# Patient Record
Sex: Female | Born: 1935 | Race: White | Hispanic: No | Smoking: Never smoker
Health system: Southern US, Community
[De-identification: ages and names within clinical notes are randomized; demographics above are authoritative.]

## PROBLEM LIST (undated history)

## (undated) DIAGNOSIS — F32A Depression, unspecified: Secondary | ICD-10-CM

## (undated) DIAGNOSIS — F419 Anxiety disorder, unspecified: Secondary | ICD-10-CM

## (undated) DIAGNOSIS — F329 Major depressive disorder, single episode, unspecified: Secondary | ICD-10-CM

## (undated) DIAGNOSIS — R42 Dizziness and giddiness: Secondary | ICD-10-CM

## (undated) DIAGNOSIS — I10 Essential (primary) hypertension: Secondary | ICD-10-CM

## (undated) DIAGNOSIS — E785 Hyperlipidemia, unspecified: Secondary | ICD-10-CM

## (undated) DIAGNOSIS — K219 Gastro-esophageal reflux disease without esophagitis: Secondary | ICD-10-CM

## (undated) DIAGNOSIS — I639 Cerebral infarction, unspecified: Secondary | ICD-10-CM

## (undated) DIAGNOSIS — Z87440 Personal history of urinary (tract) infections: Secondary | ICD-10-CM

## (undated) DIAGNOSIS — K922 Gastrointestinal hemorrhage, unspecified: Secondary | ICD-10-CM

## (undated) DIAGNOSIS — F0390 Unspecified dementia without behavioral disturbance: Secondary | ICD-10-CM

## (undated) HISTORY — DX: Anxiety disorder, unspecified: F41.9

## (undated) HISTORY — PX: ELBOW SURGERY: SHX618

## (undated) HISTORY — DX: Cerebral infarction, unspecified: I63.9

## (undated) HISTORY — PX: CATARACT EXTRACTION: SUR2

## (undated) HISTORY — DX: Dizziness and giddiness: R42

## (undated) HISTORY — DX: Personal history of urinary (tract) infections: Z87.440

## (undated) HISTORY — PX: CHOLECYSTECTOMY: SHX55

---

## 1993-01-14 HISTORY — PX: CARPAL TUNNEL RELEASE: SHX101

## 2000-01-15 HISTORY — PX: ORIF DISTAL RADIUS FRACTURE: SUR927

## 2004-02-03 ENCOUNTER — Ambulatory Visit: Payer: Self-pay | Admitting: Unknown Physician Specialty

## 2004-12-26 ENCOUNTER — Ambulatory Visit: Payer: Self-pay | Admitting: Unknown Physician Specialty

## 2005-06-13 ENCOUNTER — Inpatient Hospital Stay: Payer: Self-pay | Admitting: Internal Medicine

## 2005-06-13 ENCOUNTER — Other Ambulatory Visit: Payer: Self-pay

## 2005-06-20 ENCOUNTER — Ambulatory Visit: Payer: Self-pay | Admitting: Vascular Surgery

## 2006-01-14 HISTORY — PX: ERCP W/ SPHINCTEROTOMY AND BALLOON DILATION: SHX1524

## 2006-02-07 ENCOUNTER — Ambulatory Visit: Payer: Self-pay | Admitting: Unknown Physician Specialty

## 2006-03-14 ENCOUNTER — Inpatient Hospital Stay: Payer: Self-pay | Admitting: Internal Medicine

## 2007-03-11 ENCOUNTER — Ambulatory Visit: Payer: Self-pay | Admitting: Unknown Physician Specialty

## 2007-11-09 ENCOUNTER — Ambulatory Visit: Payer: Self-pay | Admitting: General Practice

## 2007-11-23 ENCOUNTER — Inpatient Hospital Stay: Payer: Self-pay | Admitting: General Practice

## 2007-11-23 HISTORY — PX: TOTAL KNEE ARTHROPLASTY: SHX125

## 2008-01-26 ENCOUNTER — Ambulatory Visit: Payer: Self-pay | Admitting: General Practice

## 2008-06-10 ENCOUNTER — Ambulatory Visit: Payer: Self-pay | Admitting: Unknown Physician Specialty

## 2009-06-30 ENCOUNTER — Ambulatory Visit: Payer: Self-pay | Admitting: Internal Medicine

## 2009-07-07 ENCOUNTER — Ambulatory Visit: Payer: Self-pay | Admitting: Internal Medicine

## 2009-07-14 ENCOUNTER — Ambulatory Visit: Payer: Self-pay | Admitting: Internal Medicine

## 2009-07-21 ENCOUNTER — Ambulatory Visit: Payer: Self-pay | Admitting: Internal Medicine

## 2009-08-04 ENCOUNTER — Ambulatory Visit: Payer: Self-pay | Admitting: Internal Medicine

## 2009-08-18 ENCOUNTER — Ambulatory Visit: Payer: Self-pay | Admitting: Internal Medicine

## 2009-08-31 ENCOUNTER — Ambulatory Visit: Payer: Self-pay | Admitting: Internal Medicine

## 2009-09-14 ENCOUNTER — Ambulatory Visit: Payer: Self-pay | Admitting: Internal Medicine

## 2009-10-17 ENCOUNTER — Ambulatory Visit: Payer: Self-pay | Admitting: Unknown Physician Specialty

## 2009-11-16 ENCOUNTER — Encounter: Payer: Self-pay | Admitting: Unknown Physician Specialty

## 2009-11-24 ENCOUNTER — Ambulatory Visit: Payer: Self-pay | Admitting: Unknown Physician Specialty

## 2009-12-14 ENCOUNTER — Encounter: Payer: Self-pay | Admitting: Unknown Physician Specialty

## 2009-12-20 ENCOUNTER — Ambulatory Visit: Payer: Self-pay | Admitting: Neurology

## 2010-01-14 ENCOUNTER — Encounter: Payer: Self-pay | Admitting: Unknown Physician Specialty

## 2010-06-27 ENCOUNTER — Ambulatory Visit: Payer: Self-pay | Admitting: Unknown Physician Specialty

## 2010-07-10 ENCOUNTER — Inpatient Hospital Stay: Payer: Self-pay | Admitting: Internal Medicine

## 2011-11-09 ENCOUNTER — Emergency Department: Payer: Self-pay | Admitting: Emergency Medicine

## 2012-10-15 ENCOUNTER — Ambulatory Visit: Payer: Self-pay | Admitting: Orthopedic Surgery

## 2012-10-15 LAB — POTASSIUM: Potassium: 4.3 mmol/L (ref 3.5–5.1)

## 2012-10-15 LAB — HEMOGLOBIN: HGB: 13.7 g/dL (ref 12.0–16.0)

## 2012-11-19 ENCOUNTER — Emergency Department: Payer: Self-pay | Admitting: Emergency Medicine

## 2012-11-19 LAB — URINALYSIS, COMPLETE
Bilirubin,UR: NEGATIVE
Ketone: NEGATIVE
Nitrite: NEGATIVE
Ph: 5 (ref 4.5–8.0)
RBC,UR: 1 /HPF (ref 0–5)
Specific Gravity: 1.034 (ref 1.003–1.030)
Squamous Epithelial: 1
WBC UR: 3 /HPF (ref 0–5)

## 2012-11-19 LAB — COMPREHENSIVE METABOLIC PANEL
Albumin: 3.9 g/dL (ref 3.4–5.0)
Alkaline Phosphatase: 82 U/L (ref 50–136)
Anion Gap: 4 — ABNORMAL LOW (ref 7–16)
BUN: 22 mg/dL — ABNORMAL HIGH (ref 7–18)
Bilirubin,Total: 0.7 mg/dL (ref 0.2–1.0)
Calcium, Total: 9.2 mg/dL (ref 8.5–10.1)
Chloride: 103 mmol/L (ref 98–107)
EGFR (African American): >60
EGFR (Non-African Amer.): >60
Osmolality: 275 (ref 275–301)
Potassium: 3.7 mmol/L (ref 3.5–5.1)
SGPT (ALT): 24 U/L (ref 12–78)
Sodium: 135 mmol/L — ABNORMAL LOW (ref 136–145)
Total Protein: 7 g/dL (ref 6.4–8.2)

## 2012-11-19 LAB — CBC
MCH: 30.2 pg (ref 26.0–34.0)
MCHC: 34.3 g/dL (ref 32.0–36.0)
MCV: 88 fL (ref 80–100)
Platelet: 219 10*3/uL (ref 150–440)
RBC: 4.5 10*6/uL (ref 3.80–5.20)
WBC: 23 10*3/uL — ABNORMAL HIGH (ref 3.6–11.0)

## 2012-11-19 LAB — CK TOTAL AND CKMB (NOT AT ARMC): CK-MB: 10.3 ng/mL — ABNORMAL HIGH (ref 0.5–3.6)

## 2012-12-04 ENCOUNTER — Other Ambulatory Visit: Payer: Self-pay

## 2013-04-28 ENCOUNTER — Ambulatory Visit: Payer: Self-pay | Admitting: Orthopedic Surgery

## 2013-04-28 LAB — CBC WITH DIFFERENTIAL/PLATELET
BASOS PCT: 0.5 %
Basophil #: 0.1 10*3/uL (ref 0.0–0.1)
EOS ABS: 0.2 10*3/uL (ref 0.0–0.7)
EOS PCT: 1.1 %
HCT: 41.4 % (ref 35.0–47.0)
HGB: 13.6 g/dL (ref 12.0–16.0)
Lymphocyte #: 1.7 10*3/uL (ref 1.0–3.6)
Lymphocyte %: 11.3 %
MCH: 30.1 pg (ref 26.0–34.0)
MCHC: 32.9 g/dL (ref 32.0–36.0)
MCV: 92 fL (ref 80–100)
Monocyte #: 0.8 x10 3/mm (ref 0.2–0.9)
Monocyte %: 5.5 %
NEUTROS PCT: 81.6 %
Neutrophil #: 12 10*3/uL — ABNORMAL HIGH (ref 1.4–6.5)
PLATELETS: 187 10*3/uL (ref 150–440)
RBC: 4.51 10*6/uL (ref 3.80–5.20)
RDW: 13.4 % (ref 11.5–14.5)
WBC: 14.7 10*3/uL — ABNORMAL HIGH (ref 3.6–11.0)

## 2013-04-29 ENCOUNTER — Ambulatory Visit: Payer: Self-pay | Admitting: Orthopedic Surgery

## 2013-04-30 HISTORY — PX: CARPAL TUNNEL RELEASE: SHX101

## 2013-04-30 HISTORY — PX: TRIGGER FINGER RELEASE: SHX641

## 2013-06-11 ENCOUNTER — Ambulatory Visit: Payer: Self-pay | Admitting: Neurology

## 2013-08-02 ENCOUNTER — Ambulatory Visit: Payer: Self-pay | Admitting: Ophthalmology

## 2013-08-02 DIAGNOSIS — I10 Essential (primary) hypertension: Secondary | ICD-10-CM

## 2013-08-02 LAB — POTASSIUM: Potassium: 4.4 mmol/L (ref 3.5–5.1)

## 2013-08-12 ENCOUNTER — Ambulatory Visit: Payer: Self-pay | Admitting: Ophthalmology

## 2013-09-13 ENCOUNTER — Ambulatory Visit: Payer: Self-pay | Admitting: Ophthalmology

## 2013-09-13 LAB — POTASSIUM: Potassium: 4.6 mmol/L (ref 3.5–5.1)

## 2013-09-29 ENCOUNTER — Ambulatory Visit: Payer: Self-pay | Admitting: General Practice

## 2013-10-27 ENCOUNTER — Ambulatory Visit: Payer: Self-pay | Admitting: Ophthalmology

## 2014-05-07 NOTE — Op Note (Signed)
PATIENT NAME:  Ledon SnareHUGHES, Rita Vang  DATE OF PROCEDURE:  10/27/2013  PREOPERATIVE DIAGNOSIS:  Nuclear sclerotic cataract left eye.  ICD-10 H25.12  POSTOPERATIVE DIAGNOSIS:  Nuclear sclerotic cataract left eye.  PROCEDURE:  Phacoemulsification with posterior chamber intraocular lens implantation of the left eye.   LENS:  ZCB00 22.0-diopter posterior chamber intraocular lens.  ULTRASOUND TIME:  11% of 1 minute, 34 seconds.  CDE 10.7.    SURGEON:  Italyhad Torianne Laflam, MD  ANESTHESIA:  Topical with tetracaine drops and 2% Xylocaine jelly.  COMPLICATIONS:  None.  DESCRIPTION OF PROCEDURE:  The patient was identified in the holding room and transported to the operating room and placed in the supine position under the operating microscope.  The left eye was identified as the operative eye and it was prepped and draped in the usual sterile ophthalmic fashion.  A 1 millimeter clear-corneal paracentesis was made at the 1:30 position.  The anterior chamber was filled with Viscoat viscoelastic.  A 2.4 millimeter keratome was used to make a near-clear corneal incision at the 10:30 position.  A curvilinear capsulorrhexis was made with a cystotome and capsulorrhexis forceps.  Balanced salt solution was used to hydrodissect and hydrodelineate the nucleus.  Phacoemulsification was then used in stop and chop fashion to remove the lens nucleus and epinucleus.  The remaining cortex was then removed using the irrigation and aspiration handpiece. Provisc was then placed into the capsular bag to distend it for lens placement.  A ZCB00 22.0 diopter lens was then injected into the capsular bag.  The remaining viscoelastic was aspirated.  Wounds were hydrated with balanced salt solution.  The anterior chamber was inflated to a physiologic pressure with balanced salt solution.  0.1 mL of cefuroxime 10 mg/mL were injected into the anterior chamber for a dose of 1 mg of intracameral  antibiotic at the completion of the case. Miostat was placed into the anterior chamber to constrict the pupil.  No wound leaks were noted.  Topical Vigamox drops and Maxitrol ointment were applied to the eye.  The patient was taken to the recovery room in stable condition without complications of anesthesia or surgery.   ____________________________ Deirdre Evenerhadwick R. Keidra Withers, MD crb:bu D: 10/27/2013 16:28:42 ET T: 10/27/2013 21:00:43 ET JOB#: [id number]  cc: Deirdre Evenerhadwick R. Jerrald Doverspike, MD, <Dictator>    Lockie MolaHADWICK Karan Inclan MD ELECTRONICALLY SIGNED 10/28/2013 8:07

## 2014-05-07 NOTE — Op Note (Signed)
PATIENT NAME:  Rita Vang, Rita Vang MR#:  [id number] DATE OF BIRTH:  25-Mar-1924  DATE OF PROCEDURE:  08/12/2013  PREOPERATIVE DIAGNOSIS:  Senile cataract right eye.  POSTOPERATIVE DIAGNOSIS:  Senile cataract right eye.  PROCEDURE:  Phacoemulsification with posterior chamber intraocular lens implantation of the right eye.  LENS:  ZCBOO 22.0-diopter posterior chamber intraocular lens.  ULTRASOUND TIME:  17% of 1 minute, 15 seconds.  CDE 12.8.  SURGEON:  Italyhad Orvile Corona, MD  ANESTHESIA:  Topical with tetracaine drops and 2% Xylocaine jelly.  COMPLICATIONS:  None.  DESCRIPTION OF PROCEDURE:  The patient was identified in the holding room and transported to the operating room and placed in the supine position under the operating microscope.  The right eye was identified as the operative eye and it was prepped and draped in the usual sterile ophthalmic fashion.  A 1 millimeter clear-corneal paracentesis was made at the 12:00 position.  The anterior chamber was filled with Viscoat viscoelastic.  A 2.4 millimeter keratome was used to make a near-clear corneal incision at the 9:00 position.  A curvilinear capsulorrhexis was made with a cystotome and capsulorrhexis forceps.  Balanced salt solution was used to hydrodissect and hydrodelineate the nucleus.  Phacoemulsification was then used in stop and chop fashion to remove the lens nucleus and epinucleus.  The remaining cortex was then removed using the irrigation and aspiration handpiece. Provisc was then placed into the capsular bag to distend it for lens placement.  A ZCBOO 22.0-diopter lens was then injected into the capsular bag.  The remaining viscoelastic was aspirated.  Wounds were hydrated with balanced salt solution.  The anterior chamber was inflated to a physiologic pressure with balanced salt solution.  0.1 mL of cefuroxime 10 mg/mL were injected into the anterior chamber for a dose of 1 mg of intracameral antibiotic at the completion of the  case. Miostat was placed into the anterior chamber to constrict the pupil.  No wound leaks were noted.  Topical Vigamox drops and Maxitrol ointment were applied to the eye.  The patient was taken to the recovery room in stable condition without complications of anesthesia or surgery.   ____________________________ Deirdre Evenerhadwick R. Yarithza Mink, MD crb:ts D: 08/12/2013 11:36:38 ET T: 08/12/2013 11:59:10 ET JOB#: [id number]  cc: Deirdre Evenerhadwick R. Liany Mumpower, MD, <Dictator> Lockie MolaHADWICK Julie-Anne Torain MD ELECTRONICALLY SIGNED 08/12/2013 13:08

## 2014-05-07 NOTE — Op Note (Signed)
PATIENT NAME:  Rita SnareHUGHES, Agueda L MR#:  [id number] DATE OF BIRTH:  [birth year]  DATE OF PROCEDURE:  04/29/2013  PREOPERATIVE DIAGNOSIS:  Left carpal tunnel syndrome and left middle trigger finger.   POSTOPERATIVE DIAGNOSIS:  Left carpal tunnel syndrome and left middle trigger finger.  PROCEDURE:  Left carpal tunnel release, left long trigger finger release.   ANESTHESIA:  MAC with a local.   SURGEON:  Kennedy BuckerMichael Jamera Vanloan, M.D.   DESCRIPTION OF PROCEDURE:  The patient was brought to the Operating Room and after adequate sedation was given, 10 mL was infiltrated in the area of the carpal tunnel incision, 5 mL in the area of the A1 pulley of the long finger with a 50-50 mix used in both of 1% Xylocaine and 0.5% Sensorcaine without epinephrine.  After prepping and draping in the usual sterile fashion, appropriate patient identification, timeout procedures were completed and a tourniquet below the elbow was raised to 250 mmHg.  An incision for the carpal tunnel was made in line with the ring metacarpal.  Skin and subcutaneous tissue incised and spread.  The small Weitlaner retractor is placed and the transverse carpal ligament opened.  A vascular hemostat was placed underneath this to protect the underlying structure and release was carried out distally and then proximally until there was good vascular blush visible within the nerve.  Following this, the wound was irrigated and subsequently closed with 4-0 nylon.  Next, the trigger finger was released with a small approximately 1.5 cm incision over the A1 pulley.  Subcutaneous tissue was spread and the A1 pulley was exposed and quite thick and dense.  After release, the tendon was noted to be intact, but quite swollen distal to this and with passive range of motion there is no triggering.  The wound was irrigated and closed with simple interrupted 4-0 nylon.  Sterile dressings of Xeroform, 4 x 4's, Webril and Ace wrap were applied and the patient was sent to the  recovery room in stable condition.   ESTIMATED BLOOD LOSS:  Minimal.   COMPLICATIONS:  None.   SPECIMEN:  None.   TOURNIQUET TIME:  18 minutes at 250 mmHg.     ____________________________ Leitha SchullerMichael J. Adalaya Irion, MD mjm:ea D: 04/29/2013 16:20:36 ET T: 04/30/2013 00:49:22 ET JOB#: [id number]  cc: Leitha SchullerMichael J. Keirra Zeimet, MD, <Dictator> Leitha SchullerMICHAEL J Makhia Vosler MD ELECTRONICALLY SIGNED 04/30/2013 1:21

## 2014-08-23 ENCOUNTER — Emergency Department: Payer: Medicare Other

## 2014-08-23 ENCOUNTER — Inpatient Hospital Stay: Payer: Medicare Other

## 2014-08-23 ENCOUNTER — Encounter: Payer: Self-pay | Admitting: Emergency Medicine

## 2014-08-23 ENCOUNTER — Inpatient Hospital Stay (HOSPITAL_COMMUNITY)
Admit: 2014-08-23 | Discharge: 2014-08-23 | Disposition: A | Discharge status: Home / Self Care | Payer: Medicare Other | Attending: Internal Medicine | Admitting: Internal Medicine

## 2014-08-23 ENCOUNTER — Inpatient Hospital Stay
Admission: EM | Admit: 2014-08-23 | Discharge: 2014-08-24 | DRG: 066 | Disposition: A | Discharge status: Home / Self Care | Payer: Medicare Other | Attending: Internal Medicine | Admitting: Internal Medicine

## 2014-08-23 DIAGNOSIS — I340 Nonrheumatic mitral (valve) insufficiency: Secondary | ICD-10-CM

## 2014-08-23 DIAGNOSIS — F0390 Unspecified dementia without behavioral disturbance: Secondary | ICD-10-CM | POA: Diagnosis present

## 2014-08-23 DIAGNOSIS — F329 Major depressive disorder, single episode, unspecified: Secondary | ICD-10-CM | POA: Diagnosis present

## 2014-08-23 DIAGNOSIS — R471 Dysarthria and anarthria: Secondary | ICD-10-CM | POA: Diagnosis present

## 2014-08-23 DIAGNOSIS — Z8673 Personal history of transient ischemic attack (TIA), and cerebral infarction without residual deficits: Secondary | ICD-10-CM

## 2014-08-23 DIAGNOSIS — I10 Essential (primary) hypertension: Secondary | ICD-10-CM | POA: Diagnosis present

## 2014-08-23 DIAGNOSIS — I63412 Cerebral infarction due to embolism of left middle cerebral artery: Secondary | ICD-10-CM | POA: Diagnosis not present

## 2014-08-23 DIAGNOSIS — I639 Cerebral infarction, unspecified: Principal | ICD-10-CM | POA: Diagnosis present

## 2014-08-23 HISTORY — DX: Unspecified dementia, unspecified severity, without behavioral disturbance, psychotic disturbance, mood disturbance, and anxiety: F03.90

## 2014-08-23 HISTORY — DX: Essential (primary) hypertension: I10

## 2014-08-23 HISTORY — DX: Major depressive disorder, single episode, unspecified: F32.9

## 2014-08-23 HISTORY — DX: Depression, unspecified: F32.A

## 2014-08-23 HISTORY — DX: Cerebral infarction, unspecified: I63.9

## 2014-08-23 LAB — COMPREHENSIVE METABOLIC PANEL
ALK PHOS: 86 U/L (ref 38–126)
ALT: 21 U/L (ref 14–54)
AST: 28 U/L (ref 15–41)
Albumin: 4.1 g/dL (ref 3.5–5.0)
Anion gap: 11 (ref 5–15)
BILIRUBIN TOTAL: 0.5 mg/dL (ref 0.3–1.2)
BUN: 26 mg/dL — ABNORMAL HIGH (ref 6–20)
CO2: 24 mmol/L (ref 22–32)
Calcium: 8.9 mg/dL (ref 8.9–10.3)
Chloride: 98 mmol/L — ABNORMAL LOW (ref 101–111)
Creatinine, Ser: 1.1 mg/dL — ABNORMAL HIGH (ref 0.44–1.00)
GFR calc Af Amer: 51 mL/min — ABNORMAL LOW (ref >60)
GFR calc non Af Amer: 44 mL/min — ABNORMAL LOW (ref >60)
Glucose, Bld: 111 mg/dL — ABNORMAL HIGH (ref 65–99)
POTASSIUM: 4.3 mmol/L (ref 3.5–5.1)
SODIUM: 133 mmol/L — AB (ref 135–145)
Total Protein: 6.4 g/dL — ABNORMAL LOW (ref 6.5–8.1)

## 2014-08-23 LAB — URINALYSIS COMPLETE WITH MICROSCOPIC (ARMC ONLY)
Bacteria, UA: NONE SEEN
Bilirubin Urine: NEGATIVE
Glucose, UA: NEGATIVE mg/dL
HGB URINE DIPSTICK: NEGATIVE
KETONES UR: NEGATIVE mg/dL
Leukocytes, UA: NEGATIVE
Nitrite: NEGATIVE
PH: 5 (ref 5.0–8.0)
Protein, ur: NEGATIVE mg/dL
Specific Gravity, Urine: 1.013 (ref 1.005–1.030)
Squamous Epithelial / LPF: NONE SEEN

## 2014-08-23 LAB — DIFFERENTIAL
Basophils Absolute: 0.1 10*3/uL (ref 0–0.1)
Basophils Relative: 1 %
EOS PCT: 1 %
Eosinophils Absolute: 0.1 10*3/uL (ref 0–0.7)
Lymphocytes Relative: 9 %
Lymphs Abs: 1.3 10*3/uL (ref 1.0–3.6)
MONO ABS: 0.7 10*3/uL (ref 0.2–0.9)
MONOS PCT: 5 %
NEUTROS PCT: 84 %
Neutro Abs: 12.1 10*3/uL — ABNORMAL HIGH (ref 1.4–6.5)

## 2014-08-23 LAB — CBC
HEMATOCRIT: 39.4 % (ref 35.0–47.0)
HEMOGLOBIN: 13 g/dL (ref 12.0–16.0)
MCH: 31.1 pg (ref 26.0–34.0)
MCHC: 33.1 g/dL (ref 32.0–36.0)
MCV: 94 fL (ref 80.0–100.0)
PLATELETS: 130 10*3/uL — AB (ref 150–440)
RBC: 4.19 MIL/uL (ref 3.80–5.20)
RDW: 14.7 % — ABNORMAL HIGH (ref 11.5–14.5)
WBC: 14.4 10*3/uL — AB (ref 3.6–11.0)

## 2014-08-23 LAB — LIPID PANEL
Cholesterol: 223 mg/dL — ABNORMAL HIGH (ref 0–200)
HDL: 45 mg/dL (ref >40)
LDL Cholesterol: 133 mg/dL — ABNORMAL HIGH (ref 0–99)
TRIGLYCERIDES: 224 mg/dL — AB (ref <150)
Total CHOL/HDL Ratio: 5 RATIO
VLDL: 45 mg/dL — AB (ref 0–40)

## 2014-08-23 LAB — PROTIME-INR
INR: 0.95
Prothrombin Time: 12.9 seconds (ref 11.4–15.0)

## 2014-08-23 LAB — APTT: APTT: 31 s (ref 24–36)

## 2014-08-23 MED ORDER — VITAMIN D 1000 UNITS PO TABS
1000.0000 [IU] | ORAL_TABLET | Freq: Every day | ORAL | Status: DC
  Administered 2014-08-24: 10:00:00 1000 [IU] via ORAL
  Filled 2014-08-23: qty 1

## 2014-08-23 MED ORDER — ASPIRIN 325 MG PO TABS
325.0000 mg | ORAL_TABLET | Freq: Every day | ORAL | Status: DC
  Administered 2014-08-24: 11:00:00 325 mg via ORAL
  Filled 2014-08-23: qty 1

## 2014-08-23 MED ORDER — FLUTICASONE PROPIONATE 50 MCG/ACT NA SUSP
1.0000 | Freq: Every day | NASAL | Status: DC
  Administered 2014-08-24: 10:00:00 1 via NASAL
  Filled 2014-08-23: qty 16

## 2014-08-23 MED ORDER — DONEPEZIL HCL 5 MG PO TABS
10.0000 mg | ORAL_TABLET | Freq: Every day | ORAL | Status: DC
  Administered 2014-08-23: 21:00:00 10 mg via ORAL
  Filled 2014-08-23: qty 2

## 2014-08-23 MED ORDER — ASPIRIN 81 MG PO CHEW
324.0000 mg | CHEWABLE_TABLET | Freq: Once | ORAL | Status: AC
  Administered 2014-08-23: 324 mg via ORAL
  Filled 2014-08-23: qty 4

## 2014-08-23 MED ORDER — ALUM & MAG HYDROXIDE-SIMETH 200-200-20 MG/5ML PO SUSP: 30.0000 mL | Freq: Four times a day (QID) | ORAL | Status: DC | PRN

## 2014-08-23 MED ORDER — TRAMADOL HCL 50 MG PO TABS
50.0000 mg | ORAL_TABLET | Freq: Two times a day (BID) | ORAL | Status: DC
  Administered 2014-08-24: 50 mg via ORAL
  Filled 2014-08-23: qty 1

## 2014-08-23 MED ORDER — CITALOPRAM HYDROBROMIDE 20 MG PO TABS
20.0000 mg | ORAL_TABLET | Freq: Every day | ORAL | Status: DC
  Administered 2014-08-24: 20 mg via ORAL
  Filled 2014-08-23: qty 1

## 2014-08-23 MED ORDER — ONDANSETRON HCL 4 MG PO TABS: 4.0000 mg | ORAL_TABLET | Freq: Four times a day (QID) | ORAL | Status: DC | PRN

## 2014-08-23 MED ORDER — SENNOSIDES-DOCUSATE SODIUM 8.6-50 MG PO TABS: 1.0000 | ORAL_TABLET | Freq: Every evening | ORAL | Status: DC | PRN

## 2014-08-23 MED ORDER — METOPROLOL SUCCINATE ER 25 MG PO TB24
50.0000 mg | ORAL_TABLET | Freq: Every day | ORAL | Status: DC
  Administered 2014-08-24: 50 mg via ORAL
  Filled 2014-08-23: qty 2

## 2014-08-23 MED ORDER — CALCIUM CARBONATE 1250 (500 CA) MG PO TABS
1.0000 | ORAL_TABLET | Freq: Every day | ORAL | Status: DC
  Administered 2014-08-24: 10:00:00 500 mg via ORAL
  Filled 2014-08-23: qty 1

## 2014-08-23 MED ORDER — MOMETASONE FURO-FORMOTEROL FUM 100-5 MCG/ACT IN AERO
2.0000 | INHALATION_SPRAY | Freq: Two times a day (BID) | RESPIRATORY_TRACT | Status: DC
  Administered 2014-08-23 – 2014-08-24 (×2): 2 via RESPIRATORY_TRACT
  Filled 2014-08-23: qty 8.8

## 2014-08-23 MED ORDER — HEPARIN SODIUM (PORCINE) 5000 UNIT/ML IJ SOLN
5000.0000 [IU] | Freq: Three times a day (TID) | INTRAMUSCULAR | Status: DC
  Administered 2014-08-23 – 2014-08-24 (×2): 5000 [IU] via SUBCUTANEOUS
  Filled 2014-08-23 (×2): qty 1

## 2014-08-23 MED ORDER — ACETAMINOPHEN 650 MG RE SUPP: 650.0000 mg | Freq: Four times a day (QID) | RECTAL | Status: DC | PRN

## 2014-08-23 MED ORDER — VITAMIN D3 25 MCG (1000 UNIT) PO TABS: 1000.0000 [IU] | ORAL_TABLET | Freq: Every day | ORAL | Status: DC

## 2014-08-23 MED ORDER — PANTOPRAZOLE SODIUM 40 MG PO TBEC
40.0000 mg | DELAYED_RELEASE_TABLET | Freq: Every day | ORAL | Status: DC
  Administered 2014-08-24: 40 mg via ORAL
  Filled 2014-08-23: qty 1

## 2014-08-23 MED ORDER — ACETAMINOPHEN 325 MG PO TABS: 650.0000 mg | ORAL_TABLET | Freq: Four times a day (QID) | ORAL | Status: DC | PRN

## 2014-08-23 MED ORDER — DOCUSATE SODIUM 100 MG PO CAPS
100.0000 mg | ORAL_CAPSULE | Freq: Every day | ORAL | Status: DC
  Administered 2014-08-24: 11:00:00 100 mg via ORAL
  Filled 2014-08-23: qty 1

## 2014-08-23 MED ORDER — ROSUVASTATIN CALCIUM 10 MG PO TABS: 10.0000 mg | ORAL_TABLET | Freq: Every day | ORAL | Status: DC

## 2014-08-23 MED ORDER — QUETIAPINE FUMARATE 25 MG PO TABS
25.0000 mg | ORAL_TABLET | Freq: Every day | ORAL | Status: DC
  Administered 2014-08-23: 25 mg via ORAL
  Filled 2014-08-23: qty 1

## 2014-08-23 MED ORDER — ONDANSETRON HCL 4 MG/2ML IJ SOLN: 4.0000 mg | Freq: Four times a day (QID) | INTRAMUSCULAR | Status: DC | PRN

## 2014-08-23 MED ORDER — LORATADINE 10 MG PO TABS
10.0000 mg | ORAL_TABLET | Freq: Every day | ORAL | Status: DC
  Administered 2014-08-24: 11:00:00 20 mg via ORAL
  Filled 2014-08-23: qty 1

## 2014-08-23 NOTE — ED Notes (Signed)
Pt sent over from [facility] with reports of slurred speech. Pt with hx of cva. Pt states "my tongue will no cooperate with me". Pt noted to have muffled speech in triage. No other signs or symptoms of cva.

## 2014-08-23 NOTE — Plan of Care (Signed)
Problem: Acute Treatment Outcomes Goal: Prognosis discussed with family/patient as appropriate Outcome: Progressing Daughter at the bedside and aware of plan of care. Test and procedures explained to pt and family.

## 2014-08-23 NOTE — Progress Notes (Signed)
Dr. Anne Hahn notified patient has NSR with 1st degree HB. Will continue to monitor.

## 2014-08-23 NOTE — H&P (Signed)
[facility] [place] at [facility]   PATIENT NAME: Rita Vang    MR#:  [id number]  DATE OF BIRTH:  09/17/1924  DATE OF ADMISSION:  08/23/2014  PRIMARY CARE PHYSICIAN: Clydie Braun, MD   REQUESTING/REFERRING PHYSICIAN: Dr. York Cerise  CHIEF COMPLAINT:  Slurred speech  HISTORY OF PRESENT ILLNESS:  Rita Vang  is a 81 y.o. female with a known history of CVA and HTN who presents with above complaint. Patient has been in normal state of health. This morning her caregiver came and checked on her and noted her speech to be different. She called the patient's son who then called EMS and saw the patient. The son reports the patient has slurred speech. Patient's speech has improved although she has some very mild slurred speech. She has no other neurological deficits.  PAST MEDICAL HISTORY:   Past Medical History  Diagnosis Date  . CVA (cerebral vascular accident)   . Depression    Essential HTN dementia PAST SURGICAL HISTORY:   Past Surgical History  Procedure Laterality Date  . Cholecystectomy    . Carpal tunnel release      SOCIAL HISTORY:   History  Substance Use Topics  . Smoking status: Never Smoker   . Smokeless tobacco: Not on file  . Alcohol Use: No    FAMILY HISTORY:  No history of hypertension CAD or stroke  DRUG ALLERGIES:  No Known Allergies   REVIEW OF SYSTEMS:  CONSTITUTIONAL: No fever, fatigue or weakness.  EYES: No blurred or double vision.  EARS, NOSE, AND THROAT: No tinnitus or ear pain.  RESPIRATORY: No cough, shortness of breath, wheezing or hemoptysis.  CARDIOVASCULAR: No chest pain, orthopnea, edema.  GASTROINTESTINAL: No nausea, vomiting, diarrhea or abdominal pain.  GENITOURINARY: No dysuria, hematuria.  ENDOCRINE: No polyuria, nocturia,  HEMATOLOGY: No anemia, or bleeding she has some bruising on her back SKIN: No rash or lesion. MUSCULOSKELETAL: No joint pain positive arthritis.   NEUROLOGIC: No  tingling, numbness, weakness. Positive dysarthria PSYCHIATRY: No anxiety positive depression.   MEDICATIONS AT HOME:   Prior to Admission medications   Medication Sig Start Date End Date Taking? Authorizing Provider  calcium carbonate (OS-CAL - DOSED IN MG OF ELEMENTAL CALCIUM) 1250 (500 CA) MG tablet Take 1 tablet by mouth daily.   Yes Historical Provider, MD  cholecalciferol (VITAMIN D) 1000 UNITS tablet Take 1,000 Units by mouth daily.   Yes Historical Provider, MD  citalopram (CELEXA) 20 MG tablet Take 1 tablet by mouth daily. 12/30/13  Yes Historical Provider, MD  docusate sodium (COLACE) 100 MG capsule Take 1 capsule by mouth daily.   Yes Historical Provider, MD  donepezil (ARICEPT) 10 MG tablet Take 1 tablet by mouth at bedtime. 02/02/14  Yes Historical Provider, MD  esomeprazole (NEXIUM) 40 MG capsule Take 40 mg by mouth daily.   Yes Historical Provider, MD  fluticasone (FLONASE) 50 MCG/ACT nasal spray Place 1 spray into both nostrils daily. 02/24/14  Yes Historical Provider, MD  Fluticasone-Salmeterol (ADVAIR) 100-50 MCG/DOSE AEPB Inhale 1 puff into the lungs 2 (two) times daily.   Yes Historical Provider, MD  Glucosamine-Chondroit-Vit C-Mn (GLUCOSAMINE 1500 COMPLEX PO) Take 1 capsule by mouth daily.   Yes Historical Provider, MD  ibuprofen (ADVIL,MOTRIN) 200 MG tablet Take 200-400 mg by mouth as needed.   Yes Historical Provider, MD  losartan (COZAAR) 100 MG tablet Take 1 tablet by mouth daily. 03/23/14  Yes Historical Provider, MD  metoprolol succinate (TOPROL-XL) 50 MG 24 hr tablet Take  1 tablet by mouth daily. 07/19/14  Yes Historical Provider, MD  Multiple Vitamins-Minerals (I-VITE PROTECT PO) Take 1 tablet by mouth daily.   Yes Historical Provider, MD  QUEtiapine (SEROQUEL) 25 MG tablet Take 1 tablet by mouth at bedtime. 01/24/14  Yes Historical Provider, MD  traMADol (ULTRAM) 50 MG tablet Take 50 mg by mouth 2 (two) times daily.   Yes Historical Provider, MD   triamterene-hydrochlorothiazide (MAXZIDE-25) 37.5-25 MG per tablet Take 0.5 tablets by mouth daily. 06/16/14  Yes Historical Provider, MD  valACYclovir (VALTREX) 500 MG tablet Take 500 mg by mouth as needed.   Yes Historical Provider, MD  aspirin EC 81 MG tablet Take 1 tablet by mouth daily.    Historical Provider, MD  cetirizine (ZYRTEC) 10 MG tablet Take 10 mg by mouth as needed for allergies.    Historical Provider, MD  dimenhyDRINATE (DRAMAMINE) 50 MG tablet Take 1 tablet by mouth as needed.    Historical Provider, MD  meclizine (ANTIVERT) 25 MG tablet Take 1 tablet by mouth as needed.    Historical Provider, MD  polyethylene glycol (MIRALAX) packet Take 17 g by mouth as needed.    Historical Provider, MD      VITAL SIGNS:  Blood pressure 148/71, pulse 62, temperature 97.8 F (36.6 C), temperature source Oral, resp. rate 18, height 5\' 5"  (1.651 m), weight 64.864 kg (143 lb), SpO2 97 %.  PHYSICAL EXAMINATION:  GENERAL:  81 y.o.-year-old patient lying in the bed with no acute distress.  EYES: Pupils equal, round, reactive to light and accommodation. No scleral icterus. Extraocular muscles intact.  HEENT: Head atraumatic, normocephalic. Oropharynx and nasopharynx clear.  NECK:  Supple, no jugular venous distention. No thyroid enlargement, no tenderness.  LUNGS: Normal breath sounds bilaterally, no wheezing, rales,rhonchi or crepitation. No use of accessory muscles of respiration.  CARDIOVASCULAR: S1, S2 normal. No murmurs, rubs, or gallops.  ABDOMEN: Soft, nontender, nondistended. Bowel sounds present. No organomegaly or mass.  EXTREMITIES: No pedal edema, cyanosis, or clubbing.  NEUROLOGIC: Cranial nerves II through XII are grossly intact. No focal deficits. She has some dysarthria PSYCHIATRIC: The patient is alert and oriented x 3.  SKIN: No obvious rash, lesion, or ulcer. She has a bruise on her back right side  LABORATORY PANEL:   CBC  Recent Labs Lab 08/23/14 1355  WBC 14.4*   HGB 13.0  HCT 39.4  PLT 130*   ------------------------------------------------------------------------------------------------------------------  Chemistries   Recent Labs Lab 08/23/14 1355  NA 133*  K 4.3  CL 98*  CO2 24  GLUCOSE 111*  BUN 26*  CREATININE 1.10*  CALCIUM 8.9  AST 28  ALT 21  ALKPHOS 86  BILITOT 0.5   ------------------------------------------------------------------------------------------------------------------  Cardiac Enzymes No results for input(s): TROPONINI in the last 168 hours. ------------------------------------------------------------------------------------------------------------------  RADIOLOGY:  Ct Head Wo Contrast  08/23/2014   CLINICAL DATA:  Slurred speech and mental status changes day.  EXAM: CT HEAD WITHOUT CONTRAST  TECHNIQUE: Contiguous axial images were obtained from the base of the skull through the vertex without intravenous contrast.  COMPARISON:  Brain MRI 02/29/2015 and head CT 07/10/2010.  FINDINGS: Stable age related cerebral atrophy, ventriculomegaly and periventricular white matter disease. No extra-axial fluid collections are identified. No CT findings for acute hemispheric infarction or intracranial hemorrhage. Remote lacunar type infarct noted in the right external capsule region. No mass lesions. The brainstem and cerebellum are normal.  No acute bony findings. The paranasal sinuses and mastoid air cells are clear except for a small amount  of fluid in the left maxillary sinus. The globes are intact.  IMPRESSION: 1. Stable age related cerebral atrophy, ventriculomegaly and periventricular white matter disease. Remote lacunar type infarct noted in the right external capsule region. 2. No acute intracranial findings or mass lesions. 3. Small amount of fluid in the left maxillary sinus.   Electronically Signed   By: Rudie Meyer M.D.   On: 08/23/2014 14:17    EKG:   Normal sinus rhythm and no ST elevation  depression IMPRESSION AND PLAN:  This 81 year female with history of hypertension and CVA who presents with dysarthria.  1. Dysarthria: Patient continues to have some mild dysarthria. She will be admitted to the hospice service to evaluate for stroke. I will order MRI of the head, carotid Doppler, 2-D echocardiogram with bubble study. I will also obtain neurology consultation and continue neuro checks every 4 hours. She stopped her aspirin yesterday due to some bruising. I will continue aspirin until further evaluation with the studies. I will also check fasting lipids and start her on a statin. She will need speech evaluation as well.  2. Essential hypertension: I will hold hypertensive medications except for metoprol for now to allow brain perfusion for acute CVA.  3. Depression: Patient can continue outpatient medications.  4. Dementia: Continue outpatient medications.   All the records are reviewed and case discussed with ED provider. Management plans discussed with the patient and she is in agreement.  CODE STATUS: FULL  TOTAL TIME TAKING CARE OF THIS PATIENT: 40 minutes.    Malaki Koury M.D on 08/23/2014 at 3:59 PM  Between 7am to 6pm - Pager - [phone] After 6pm go to [email or web address] - password [email or web address] [facility]  [facility]  Office  [phone]  CC: Primary care physician; Clydie Braun, MD

## 2014-08-23 NOTE — ED Notes (Signed)
Patient transported to CT right from triage

## 2014-08-23 NOTE — Progress Notes (Signed)
MD notified positive results on MRI. Order for NIH scale q shift, q 2 hour neuro checks and VS x12 hours, then q4.

## 2014-08-23 NOTE — Plan of Care (Signed)
Problem: Consults Goal: Ischemic Stroke Patient Education See Patient Education Module for education specifics.  Outcome: Progressing Awaiting MRI results, pt has had a history of stroke in the past, no previous deficits noted.

## 2014-08-23 NOTE — Progress Notes (Signed)
*  PRELIMINARY RESULTS* Echocardiogram 2D Echocardiogram has been performed. A complete echo was performed per order answer, requesting a Complete study.  Rita Vang 08/23/2014, 7:52 PM

## 2014-08-23 NOTE — ED Provider Notes (Signed)
[facility] Emergency Department Provider Note  ____________________________________________  Time seen: Approximately 2:27 PM  I have reviewed the triage vital signs and the nursing notes.   HISTORY  Chief Complaint Aphasia  History limited by mild dementia  HPI Rita Vang is a 81 y.o. female with a history of prior CVA, mild dementia per old hospital records, but no other significant past medical history who presents with slurred speech/dysarthria since last night.  She states that she noticed it last night but did not think much about it.  It was worse this morning, and the woman who helps her around the house was concerned about it as well and commented that she is not talking like she normally does.  She denies any pain or numbness and just states that her tongue will not cooperate with her.  She states that her symptoms are moderate in, gradual onset, and occurred more than 12 hours ago.  She denies chest pain, nausea, vomiting, shortness of breath, headache, and abdominal pain.   Past Medical History  Diagnosis Date  . CVA (cerebral vascular accident)   . Depression   . Dementia   . HTN (hypertension)     Patient Active Problem List   Diagnosis Date Noted  . CVA (cerebral infarction) 08/23/2014    Past Surgical History  Procedure Laterality Date  . Cholecystectomy    . Carpal tunnel release      No current outpatient prescriptions on file.  Allergies Review of patient's allergies indicates no known allergies.  No family history on file.  Social History History  Substance Use Topics  . Smoking status: Never Smoker   . Smokeless tobacco: Not on file  . Alcohol Use: No    Review of Systems History is limited by the patient's mild dementia Constitutional: No fever/chills Eyes: No visual changes. ENT: No sore throat. Cardiovascular: Denies chest pain. Respiratory: Denies shortness of breath. Gastrointestinal: No abdominal  pain.  No nausea, no vomiting.  No diarrhea.  No constipation. Genitourinary: Negative for dysuria. Musculoskeletal: Negative for back pain. Skin: Negative for rash. Neurological: Negative for headaches, focal weakness or numbness. difficulty speaking because "my tongue will not cooperate"   10-point ROS otherwise negative.  ____________________________________________   PHYSICAL EXAM:  VITAL SIGNS: ED Triage Vitals  Enc Vitals Group     BP 08/23/14 1343 147/72 mmHg     Pulse Rate 08/23/14 1343 71     Resp 08/23/14 1343 18     Temp 08/23/14 1343 97.8 F (36.6 C)     Temp Source 08/23/14 1343 Oral     SpO2 08/23/14 1343 93 %     Weight 08/23/14 1343 143 lb (64.864 kg)     Height 08/23/14 1343  (1.651 m)     Head Cir --      Peak Flow --      Pain Score --      Pain Loc --      Pain Edu? --      Excl. in GC? --     Constitutional: Alert and cheerful elderly female in no acute distress.   Eyes: Conjunctivae are normal. PERRL. EOMI. Head: Atraumatic. no angioedema including no swelling of her tongue.   Nose: No congestion/rhinnorhea.  Mouth/Throat: Mucous membranes are moist.  Oropharynx non-erythematous. Neck: No stridor.   Cardiovascular: Normal rate, regular rhythm. Grossly normal heart sounds.  Good peripheral circulation. Respiratory: Normal respiratory effort.  No retractions. Lungs CTAB. Gastrointestinal: Soft and nontender. No  distention. No abdominal bruits. No CVA tenderness. Musculoskeletal: No lower extremity tenderness nor edema.  No joint effusions. Neurological:  the patient has obvious dysarthria with difficulty with enunciation but no word finding issues that would suggest aphasia.  She seems to be having problems moving her tongue.  Her strength is equal and intact bilaterally in her upper and lower extremities.  I did not specifically test her gait because she walks with a walker at baseline.  She is alert and oriented to self and to location as well as  her recent history and is able to clearly describe her symptoms.   Skin:  Skin is warm, dry and intact. No rash noted. Psychiatric: Mood and affect are normal. Speech and behavior are normal.  ____________________________________________   LABS (all labs ordered are listed, but only abnormal results are displayed)  Labs Reviewed  CBC - Abnormal; Notable for the following:    WBC 14.4 (*)    RDW 14.7 (*)    Platelets 130 (*)    All other components within normal limits  DIFFERENTIAL - Abnormal; Notable for the following:    Neutro Abs 12.1 (*)    All other components within normal limits  COMPREHENSIVE METABOLIC PANEL - Abnormal; Notable for the following:    Sodium 133 (*)    Chloride 98 (*)    Glucose, Bld 111 (*)    BUN 26 (*)    Creatinine, Ser 1.10 (*)    Total Protein 6.4 (*)    GFR calc non Af Amer 44 (*)    GFR calc Af Amer 51 (*)    All other components within normal limits  URINALYSIS COMPLETEWITH MICROSCOPIC ([facility] ONLY) - Abnormal; Notable for the following:    Color, Urine YELLOW (*)    APPearance CLEAR (*)    All other components within normal limits  LIPID PANEL - Abnormal; Notable for the following:    Cholesterol 223 (*)    Triglycerides 224 (*)    VLDL 45 (*)    LDL Cholesterol 133 (*)    All other components within normal limits  PROTIME-INR  APTT  CBC  BASIC METABOLIC PANEL   ____________________________________________  EKG  ED ECG REPORT I, Kaydee Magel, the attending physician, personally viewed and interpreted this ECG.  Date: 08/23/2014 EKG Time: 13:46 Rate: 73 Rhythm: normal sinus rhythm QRS Axis: normal Intervals: normal ST/T Wave abnormalities: T-wave inversion in lead 3 but no other ST disturbances or abnormalities that would suggest acute ischemia Conduction Disutrbances: none Narrative Interpretation: unremarkable  ____________________________________________  RADIOLOGY  Ct Head Wo Contrast  08/23/2014   CLINICAL DATA:   Slurred speech and mental status changes day.  EXAM: CT HEAD WITHOUT CONTRAST  TECHNIQUE: Contiguous axial images were obtained from the base of the skull through the vertex without intravenous contrast.  COMPARISON:  Brain MRI 02/29/2015 and head CT 07/10/2010.  FINDINGS: Stable age related cerebral atrophy, ventriculomegaly and periventricular white matter disease. No extra-axial fluid collections are identified. No CT findings for acute hemispheric infarction or intracranial hemorrhage. Remote lacunar type infarct noted in the right external capsule region. No mass lesions. The brainstem and cerebellum are normal.  No acute bony findings. The paranasal sinuses and mastoid air cells are clear except for a small amount of fluid in the left maxillary sinus. The globes are intact.  IMPRESSION: 1. Stable age related cerebral atrophy, ventriculomegaly and periventricular white matter disease. Remote lacunar type infarct noted in the right external capsule region. 2. No acute  intracranial findings or mass lesions. 3. Small amount of fluid in the left maxillary sinus.   Electronically Signed   By: Rudie Meyer M.D.   On: 08/23/2014 14:17    ____________________________________________   PROCEDURES  Procedure(s) performed: None  Critical Care performed: Yes, see critical care note(s)   CRITICAL CARE Performed by: Loleta Rose   Total critical care time: 30 minutes  Critical care time was exclusive of separately billable procedures and treating other patients.  Critical care was necessary to treat or prevent imminent or life-threatening deterioration.  Critical care was time spent personally by me on the following activities: development of treatment plan with patient and/or surrogate as well as nursing, discussions with consultants, evaluation of patient's response to treatment, examination of patient, obtaining history from patient or surrogate, ordering and performing treatments and  interventions, ordering and review of laboratory studies, ordering and review of radiographic studies, pulse oximetry and re-evaluation of patient's condition.   NIH Stroke Scale  Interval: Baseline Time: 2:50 PM Person Administering Scale: Noriah Osgood  Administer stroke scale items in the order listed. Record performance in each category after each subscale exam. Do not go back and change scores. Follow directions provided for each exam technique. Scores should reflect what the patient does, not what the clinician thinks the patient can do. The clinician should record answers while administering the exam and work quickly. Except where indicated, the patient should not be coached (i.e., repeated requests to patient to make a special effort).   1a  Level of consciousness: 0=alert; keenly responsive  1b. LOC questions:  0=Performs both tasks correctly  1c. LOC commands: 0=Performs both tasks correctly  2.  Best Gaze: 0=normal  3.  Visual: 0=No visual loss  4. Facial Palsy: 0=Normal symmetric movement  5a.  Motor left arm: 0=No drift, limb holds 90 (or 45) degrees for full 10 seconds  5b.  Motor right arm: 0=No drift, limb holds 90 (or 45) degrees for full 10 seconds  6a. motor left leg: 0=No drift, limb holds 90 (or 45) degrees for full 10 seconds  6b  Motor right leg:  0=No drift, limb holds 90 (or 45) degrees for full 10 seconds  7. Limb Ataxia: 0=Absent  8.  Sensory: 0=Normal; no sensory loss  9. Best Language:  0=No aphasia, normal  10. Dysarthria: 1=Mild to moderate, patient slurs at least some words and at worst, can be understood with some difficulty  11. Extinction and Inattention: 0=No abnormality  12. Distal motor function: 0=Normal   Total:   1     ____________________________________________   INITIAL IMPRESSION / ASSESSMENT AND PLAN / ED COURSE  Pertinent labs & imaging results that were available during my care of the patient were reviewed by me and considered in my  medical decision making (see chart for details).  The patient has obvious dysarthria which is concerning for a new CVA given her history of prior CVA and no other reasonable alternative diagnosis.  She has no other acute neurological deficits.  Her noncontrast head CT shows no acute findings but does show old infarction.  Her NIH stroke scale was 1.  She is not a candidate for TPA given that the onset of symptoms was nearly 24 hours ago, but I do believe this is likely an acute CVA.  I will continue her evaluation and anticipated admission for further workup.  I am giving her aspirin 324 mg by mouth (she passed her swallow study).  I am checking a  urinalysis, but even if she has a urinary tract infection, she needs further evaluation for possible CVA.  I discussed the case with the hospitalist who will admit.  I also discussed the case with her son at the bedside.   ____________________________________________  FINAL CLINICAL IMPRESSION(S) / ED DIAGNOSES  Final diagnoses:  Cerebral infarction due to unspecified mechanism  Dysarthria      NEW MEDICATIONS STARTED DURING THIS VISIT:  Current Discharge Medication List       Loleta Rose, MD 08/23/14 2208

## 2014-08-24 ENCOUNTER — Inpatient Hospital Stay: Payer: Medicare Other

## 2014-08-24 DIAGNOSIS — I63412 Cerebral infarction due to embolism of left middle cerebral artery: Secondary | ICD-10-CM

## 2014-08-24 LAB — BASIC METABOLIC PANEL
Anion gap: 8 (ref 5–15)
BUN: 27 mg/dL — ABNORMAL HIGH (ref 6–20)
CO2: 27 mmol/L (ref 22–32)
CREATININE: 0.96 mg/dL (ref 0.44–1.00)
Calcium: 9.3 mg/dL (ref 8.9–10.3)
Chloride: 104 mmol/L (ref 101–111)
GFR, EST AFRICAN AMERICAN: 60 mL/min — AB (ref >60)
GFR, EST NON AFRICAN AMERICAN: 52 mL/min — AB (ref >60)
Glucose, Bld: 96 mg/dL (ref 65–99)
Potassium: 4 mmol/L (ref 3.5–5.1)
Sodium: 139 mmol/L (ref 135–145)

## 2014-08-24 LAB — CBC
HCT: 36.5 % (ref 35.0–47.0)
Hemoglobin: 12 g/dL (ref 12.0–16.0)
MCH: 31.2 pg (ref 26.0–34.0)
MCHC: 33 g/dL (ref 32.0–36.0)
MCV: 94.6 fL (ref 80.0–100.0)
PLATELETS: 115 10*3/uL — AB (ref 150–440)
RBC: 3.86 MIL/uL (ref 3.80–5.20)
RDW: 14.8 % — AB (ref 11.5–14.5)
WBC: 12.2 10*3/uL — AB (ref 3.6–11.0)

## 2014-08-24 MED ORDER — ASPIRIN 325 MG PO TABS: 325.0000 mg | ORAL_TABLET | Freq: Every day | ORAL | Status: DC

## 2014-08-24 MED ORDER — ROSUVASTATIN CALCIUM 10 MG PO TABS: 10.0000 mg | ORAL_TABLET | Freq: Every day | ORAL | Status: DC

## 2014-08-24 NOTE — Evaluation (Signed)
Physical Therapy Evaluation Patient Details Name: Rita Vang MRN: [id number] DOB: [birth year] Today's Date: 08/24/2014   History of Present Illness  Rita Vang is a 81 y.o. female with a known history of CVA and HTN who presents with slurred speed. Patient lives was living alone with assistance from caregivers prior to admittance. Patient had MRI which showed a positive left thalamic CVA.   Clinical Impression  81 yo Female came to ED with slurred speech. Patient was diagnosed with left thalamic CVA. Patient reports being mostly modified independent in gait tasks with RW at baseline. She does have caregivers that assist with bathing, dressing and meal prep. Currently she is modified independent in transfers and was able to ambulate 120 feet with RW, close supervision. Patient demonstrates good foot clearance with gait, however ambulates with slower gait speed, narrow base of support and decreased posture. She reports being at baseline. Patient was not interested in additional therapy at this time. No additional skilled needs identified.    Follow Up Recommendations No PT follow up    Equipment Recommendations  None recommended by PT    Recommendations for Other Services       Precautions / Restrictions Precautions Precautions: Fall Restrictions Weight Bearing Restrictions: No      Mobility  Bed Mobility               General bed mobility comments: patient was up sitting edge of bed prior to evaluation;   Transfers Overall transfer level: Modified independent Equipment used: Rolling walker (2 wheeled)             General transfer comment: transfers sit<>Stand from bed with RW, modified independent demonstrating good safety awareness  Ambulation/Gait Ambulation/Gait assistance: Supervision Ambulation Distance (Feet): 120 Feet Assistive device: Rolling walker (2 wheeled) Gait Pattern/deviations: Step-through pattern;Decreased step length - left;Decreased  step length - right;Narrow base of support Gait velocity: decreased   General Gait Details: ambulates with slower gait speed; required min Vcs to stay closer to RW and increase erect posture  Stairs            Wheelchair Mobility    Modified Rankin (Stroke Patients Only)       Balance Overall balance assessment: Needs assistance Sitting-balance support: No upper extremity supported Sitting balance-Leahy Scale: Fair Sitting balance - Comments: static: good, dynamic: Fair as patient demonstrates posterior lean during LE movement while sitting unsupported     Standing balance-Leahy Scale: Fair Standing balance comment: patient able to stand without AD statically; dynamic standing balance is fair-  she does need RW for gait tasks but is supervision                             Pertinent Vitals/Pain Pain Assessment: 0-10 Pain Score: 5  Pain Location: neck Pain Descriptors / Indicators: Aching;Sore Pain Intervention(s): Repositioned    Home Living Family/patient expects to be discharged to:: Private residence Living Arrangements: Alone Available Help at Discharge:  (son, caregivers (available during the day)) Type of Home: House Home Access: Ramped entrance     Home Layout: One level Home Equipment: Walker - 2 wheels Additional Comments: reports that caregivers will be available during day; son will stop by at night. She is at home alone during the evening    Prior Function Level of Independence: Needs assistance   Gait / Transfers Assistance Needed: uses RW, modified independent for gait tasks  ADL's / Homemaking Assistance Needed: caregivers and son  help with bathing and dressing and meal prep        Hand Dominance        Extremity/Trunk Assessment   Upper Extremity Assessment: Overall WFL for tasks assessed           Lower Extremity Assessment: Overall WFL for tasks assessed      Cervical / Trunk Assessment: Kyphotic  Communication    Communication: No difficulties  Cognition Arousal/Alertness: Awake/alert Behavior During Therapy: WFL for tasks assessed/performed Overall Cognitive Status: Within Functional Limits for tasks assessed                      General Comments General comments (skin integrity, edema, etc.): intact by gross assessment    Exercises        Assessment/Plan    PT Assessment Patent does not need any further PT services  PT Diagnosis     PT Problem List    PT Treatment Interventions     PT Goals (Current goals can be found in the Care Plan section) Acute Rehab PT Goals Patient Stated Goal: to go home PT Goal Formulation: With patient Time For Goal Achievement: 08/24/14 Potential to Achieve Goals: Good    Frequency     Barriers to discharge        Co-evaluation               End of Session Equipment Utilized During Treatment: Gait belt Activity Tolerance: Patient tolerated treatment well;No increased pain Patient left: in bed;with family/visitor present Nurse Communication: Mobility status         Time: 1610-9604 PT Time Calculation (min) (ACUTE ONLY): 21 min   Charges:   PT Evaluation $Initial PT Evaluation Tier I: 1 Procedure     PT G Codes:        Hopkins,Sheyenne Konz, PT, DPT 08/24/2014, 3:28 PM

## 2014-08-24 NOTE — Discharge Instructions (Signed)
°  DIET:  °Cardiac diet ° °DISCHARGE CONDITION:  °Fair ° °ACTIVITY:  °Activity as tolerated ° °OXYGEN:  °Home Oxygen: No. °  °Oxygen Delivery: room air ° °DISCHARGE LOCATION:  °home  ° °If you experience worsening of your admission symptoms, develop shortness of breath, life threatening emergency, suicidal or homicidal thoughts you must seek medical attention immediately by calling 911 or calling your MD immediately  if symptoms less severe. ° °You Must read complete instructions/literature along with all the possible adverse reactions/side effects for all the Medicines you take and that have been prescribed to you. Take any new Medicines after you have completely understood and accpet all the possible adverse reactions/side effects.  ° °Please note ° °You were cared for by a hospitalist during your hospital stay. If you have any questions about your discharge medications or the care you received while you were in the hospital after you are discharged, you can call the unit and asked to speak with the hospitalist on call if the hospitalist that took care of you is not available. Once you are discharged, your primary care physician will handle any further medical issues. Please note that NO REFILLS for any discharge medications will be authorized once you are discharged, as it is imperative that you return to your primary care physician (or establish a relationship with a primary care physician if you do not have one) for your aftercare needs so that they can reassess your need for medications and monitor your lab values. ° ° °

## 2014-08-24 NOTE — Consult Note (Signed)
CC: dysarthria   HPI: Rita Vang is an 81 y.o. female with a known history of CVA and HTN who presents with above complaint. Patient has been in normal state of health. This morning her caregiver came and checked on her and noted her speech to be different. She called the patient's son who then called EMS and saw the patient. The son reports the patient has slurred speech. Patient's speech has improved although she has some very mild slurred speech. Pt is currently at baseline.  Past Medical History  Diagnosis Date  . CVA (cerebral vascular accident)   . Depression   . Dementia   . HTN (hypertension)     Past Surgical History  Procedure Laterality Date  . Cholecystectomy    . Carpal tunnel release      No family history on file.  Social History:  reports that she has never smoked. She does not have any smokeless tobacco history on file. She reports that she does not drink alcohol. Her drug history is not on file.  No Known Allergies  Medications: I have reviewed the patient's current medications.  ROS: History obtained from the patient  General ROS: negative for - chills, fatigue, fever, night sweats, weight gain or weight loss Psychological ROS: negative for - behavioral disorder, hallucinations, memory difficulties, mood swings or suicidal ideation Ophthalmic ROS: negative for - blurry vision, double vision, eye pain or loss of vision ENT ROS: negative for - epistaxis, nasal discharge, oral lesions, sore throat, tinnitus or vertigo Allergy and Immunology ROS: negative for - hives or itchy/watery eyes Hematological and Lymphatic ROS: negative for - bleeding problems, bruising or swollen lymph nodes Endocrine ROS: negative for - galactorrhea, hair pattern changes, polydipsia/polyuria or temperature intolerance Respiratory ROS: negative for - cough, hemoptysis, shortness of breath or wheezing Cardiovascular ROS: negative for - chest pain, dyspnea on exertion, edema or  irregular heartbeat Gastrointestinal ROS: negative for - abdominal pain, diarrhea, hematemesis, nausea/vomiting or stool incontinence Genito-Urinary ROS: negative for - dysuria, hematuria, incontinence or urinary frequency/urgency Musculoskeletal ROS: negative for - joint swelling or muscular weakness Neurological ROS: as noted in HPI Dermatological ROS: negative for rash and skin lesion changes  Physical Examination: Blood pressure 138/88, pulse 59, temperature 97.8 F (36.6 C), temperature source Oral, resp. rate 18, height 5\' 5"  (1.651 m), weight 64.864 kg (143 lb), SpO2 96 %.   Neurological Examination Mental Status: Alert, oriented, thought content appropriate.  Speech fluent without evidence of aphasia.  Able to follow 3 step commands without difficulty. Cranial Nerves: II: Discs flat bilaterally; Visual fields grossly normal, pupils equal, round, reactive to light and accommodation III,IV, VI: ptosis not present, extra-ocular motions intact bilaterally V,VII: smile symmetric, facial light touch sensation normal bilaterally VIII: hearing normal bilaterally IX,X: gag reflex present XI: bilateral shoulder shrug XII: midline tongue extension Motor: Right : Upper extremity   5/5    Left:     Upper extremity   5/5  Lower extremity   5/5     Lower extremity   5/5 Tone and bulk:normal tone throughout; no atrophy noted Sensory: Pinprick and light touch intact throughout, bilaterally Deep Tendon Reflexes: 1+ and symmetric throughout Plantars: Right: downgoing   Left: downgoing Cerebellar: normal finger-to-nose, normal rapid alternating movements and normal heel-to-shin test Gait: not tested       Laboratory Studies:   Basic Metabolic Panel:  Recent Labs Lab 08/23/14 1355 08/24/14 0459  NA 133* 139  K 4.3 4.0  CL 98* 104  CO2 24 27  GLUCOSE 111* 96  BUN 26* 27*  CREATININE 1.10* 0.96  CALCIUM 8.9 9.3    Liver Function Tests:  Recent Labs Lab 08/23/14 1355  AST  28  ALT 21  ALKPHOS 86  BILITOT 0.5  PROT 6.4*  ALBUMIN 4.1   No results for input(s): LIPASE, AMYLASE in the last 168 hours. No results for input(s): AMMONIA in the last 168 hours.  CBC:  Recent Labs Lab 08/23/14 1355 08/24/14 0459  WBC 14.4* 12.2*  NEUTROABS 12.1*  --   HGB 13.0 12.0  HCT 39.4 36.5  MCV 94.0 94.6  PLT 130* 115*    Cardiac Enzymes: No results for input(s): CKTOTAL, CKMB, CKMBINDEX, TROPONINI in the last 168 hours.  BNP: Invalid input(s): POCBNP  CBG: No results for input(s): GLUCAP in the last 168 hours.  Microbiology: Results for orders placed or performed in visit on 12/04/12  Culture, blood (single)     Status: None   Collection Time: 12/04/12  8:40 AM  Result Value Ref Range Status   Micro Text Report   Final       COMMENT                   NO GROWTH AEROBICALLY/ANAEROBICALLY IN 5 DAYS   ANTIBIOTIC                                                      Culture, blood (single)     Status: None   Collection Time: 12/04/12  8:44 AM  Result Value Ref Range Status   Micro Text Report   Final       ORGANISM 1                STAPHYLOCOCCUS HOMINIS SSP HOMINIS   COMMENT                   IN AEROBIC AND ANAEROBIC BOTTLES   COMMENT                   -   GRAM STAIN                GRAM POSITIVE COCCI IN PAIRS IN CLUSTERS   ANTIBIOTIC                    ORG#1    ORG#2     CIPROFLOXACIN                 R                  CLINDAMYCIN                   R                  ERYTHROMYCIN                  R                  GENTAMICIN                    S                  LEVOFLOXACIN  I                  OXACILLIN                     R                  VANCOMYCIN                    S                      Coagulation Studies:  Recent Labs  08/23/14 1355  LABPROT 12.9  INR 0.95    Urinalysis:  Recent Labs Lab 08/23/14 1531  COLORURINE YELLOW*  LABSPEC 1.013  PHURINE 5.0  GLUCOSEU NEGATIVE  HGBUR NEGATIVE  BILIRUBINUR  NEGATIVE  KETONESUR NEGATIVE  PROTEINUR NEGATIVE  NITRITE NEGATIVE  LEUKOCYTESUR NEGATIVE    Lipid Panel:     Component Value Date/Time   CHOL 223* 08/23/2014 1355   TRIG 224* 08/23/2014 1355   HDL 45 08/23/2014 1355   CHOLHDL 5.0 08/23/2014 1355   VLDL 45* 08/23/2014 1355   LDLCALC 133* 08/23/2014 1355    HgbA1C: No results found for: HGBA1C  Urine Drug Screen:  No results found for: LABOPIA, COCAINSCRNUR, LABBENZ, AMPHETMU, THCU, LABBARB  Alcohol Level: No results for input(s): ETH in the last 168 hours.  Other results: EKG: normal EKG, normal sinus rhythm, unchanged from previous tracings.  Imaging: Ct Head Wo Contrast  08/23/2014   CLINICAL DATA:  Slurred speech and mental status changes day.  EXAM: CT HEAD WITHOUT CONTRAST  TECHNIQUE: Contiguous axial images were obtained from the base of the skull through the vertex without intravenous contrast.  COMPARISON:  Brain MRI 02/29/2015 and head CT 07/10/2010.  FINDINGS: Stable age related cerebral atrophy, ventriculomegaly and periventricular white matter disease. No extra-axial fluid collections are identified. No CT findings for acute hemispheric infarction or intracranial hemorrhage. Remote lacunar type infarct noted in the right external capsule region. No mass lesions. The brainstem and cerebellum are normal.  No acute bony findings. The paranasal sinuses and mastoid air cells are clear except for a small amount of fluid in the left maxillary sinus. The globes are intact.  IMPRESSION: 1. Stable age related cerebral atrophy, ventriculomegaly and periventricular white matter disease. Remote lacunar type infarct noted in the right external capsule region. 2. No acute intracranial findings or mass lesions. 3. Small amount of fluid in the left maxillary sinus.   Electronically Signed   By: Rudie Meyer M.D.   On: 08/23/2014 14:17   Mr Brain Wo Contrast  08/23/2014   CLINICAL DATA:  Difficulty with speech. History of CVA in past.High  blood pressure.  EXAM: MRI HEAD WITHOUT CONTRAST  TECHNIQUE: Multiplanar, multiecho pulse sequences of the brain and surrounding structures were obtained without intravenous contrast.  COMPARISON:  None.  FINDINGS: There is a 5 x 10 mm area of restricted diffusion in the LEFT thalamus consistent with acute infarction. No other areas of restricted diffusion are seen.  No acute or chronic hemorrhage, mass lesion, or extra-axial fluid.  Global atrophy with hydrocephalus ex vacuo. Chronic microvascular ischemic change of a moderate to severe nature. Moderate-sized chronic lacunar infarct affects the posterior lentiform nucleus on the RIGHT. Ischemic demyelination or chronic nature also affects the pons. Remote RIGHT posterior frontal subcortical infarction with gliosis.  Unremarkable pituitary and pineal gland. Moderate pannus results in mild stenosis at the upper cervical region. Cervical  spondylosis with stenosis is also suspected at C3-C4.  Flow voids are maintained in the carotid, basilar, and RIGHT vertebral arteries. Diminutive LEFT vertebral appears to supply only the inferior cerebellum.  BILATERAL cataract extraction. Small air-fluid level LEFT maxillary sinus suggesting acuity. Trace BILATERAL mastoid effusions.  Compared with prior CT, the infarct is difficult to visualize.  IMPRESSION: Acute LEFT thalamic nonhemorrhagic infarct.  Advanced atrophy with extensive chronic microvascular ischemic change, and evidence for remote infarctions.  Cervical spine disease with pannus and suspected stenosis at C3-C4.   Electronically Signed   By: Elsie Stain M.D.   On: 08/23/2014 19:51     Assessment/Plan:  81 y/o L thalamic stroke. Now baseline.  No speech abnormalities Was off ASA Start ASA daily Finish stroke work up Possibly d/c today. Pauletta Browns   08/24/2014, 10:37 AM

## 2014-08-24 NOTE — Evaluation (Signed)
Clinical/Bedside Swallow Evaluation Patient Details  Name: Rita Vang MRN: [id number] Date of Birth: 1925-01-11  Today's Date: 08/24/2014 Time: SLP Start Time (ACUTE ONLY): 0745 SLP Stop Time (ACUTE ONLY): 0845 SLP Time Calculation (min) (ACUTE ONLY): 60 min  Past Medical History:  Past Medical History  Diagnosis Date  . CVA (cerebral vascular accident)   . Depression   . Dementia   . HTN (hypertension)    Past Surgical History:  Past Surgical History  Procedure Laterality Date  . Cholecystectomy    . Carpal tunnel release     HPI:  Pt is a 81 y.o. female with a known history of CVA and HTN, Dementia and depression who presented w/ slurred speech the morning of admission. Pt is alert and conversive now; she denied any significant deficits w/ her speech stating it was "back to my speech" today. Pt also stated a family member who called her earlier said the same. Pt is following general instructions w/ cues. She is able to feed herself w/ tray setup and indicate her wants/needs.     Assessment / Plan / Recommendation Clinical Impression  Pt appeared to safely tolerate trials of thin liquids and soft solids w/ no overt s/s of aspiration noted; no oral phase deficits noted. Pt fed self given only min tray setup. Pt appears at reduced risk for aspiration following general aspiration precautions. Rec. continue w/ current diet as ordered w/ general aspiration precautions; meds in puree if easier for swallowing. Pt spoke appropriately w/ SLP; intelligibility was 100%. She stated it was improved from yesterday at admission. Any baseline Cognitive decline such as Dementia can impact verbal communication as well. No further skilled ST services indicated at this time. ST will be available for any further education or f/u while admitted. Pt agreed.     Aspiration Risk   (reduced)    Diet Recommendation Age appropriate regular solids;Thin   Medication Administration: Whole meds with liquid  (or whole w/ Puree if easier for swallowing) Compensations: Slow rate;Small sips/bites    Other  Recommendations Oral Care Recommendations: Oral care BID;Patient independent with oral care   Follow Up Recommendations       Frequency and Duration        Pertinent Vitals/Pain denied    SLP Swallow Goals  n/a   Swallow Study Prior Functional Status   lives at home alone w/ caregivers during the day. Family checks on her.     General Date of Onset: 08/23/14 Other Pertinent Information: Pt is a 81 y.o. female with a known history of CVA and HTN, Dementia and depression who presented w/ slurred speech the morning of admission. Pt is alert and conversive now; she denied any significant deficits w/ her speech stating it was "back to my speech" today. Pt also stated a family member who called her earlier said the same. Pt is following general instructions w/ cues. She is able to feed herself w/ tray setup and indicate her wants/needs.   Type of Study: Bedside swallow evaluation Previous Swallow Assessment: none Diet Prior to this Study: Regular;Thin liquids Temperature Spikes Noted: No Respiratory Status: Room air (wbc 12.2) History of Recent Intubation: No Behavior/Cognition: Alert;Cooperative;Pleasant mood (baseline Dementia) Oral Cavity - Dentition: Adequate natural dentition/normal for age Self-Feeding Abilities: Able to feed self;Needs set up Patient Positioning: Upright in bed Baseline Vocal Quality: Normal Volitional Cough: Strong Volitional Swallow: Able to elicit    Oral/Motor/Sensory Function Overall Oral Motor/Sensory Function: Appears within functional limits for tasks assessed Labial  ROM: Within Functional Limits Labial Symmetry: Within Functional Limits (grossly) Labial Strength: Within Functional Limits Lingual ROM: Within Functional Limits Lingual Symmetry: Within Functional Limits Lingual Strength: Within Functional Limits Facial Symmetry: Within Functional  Limits Mandible: Within Functional Limits   Ice Chips Ice chips: Not tested Other Comments: pt eating her meal   Thin Liquid Thin Liquid: Within functional limits Presentation: Cup;Straw;Self Fed Other Comments: ~6 ozs total    Nectar Thick Nectar Thick Liquid: Not tested   Honey Thick Honey Thick Liquid: Not tested   Puree Puree: Not tested   Solid   GO    Solid: Within functional limits Presentation: Self Fed;Spoon Other Comments: pt consumed all eggs, toast and potatoes      Jerilynn Som, MS, CCC-SLP  Jayvien Rowlette 08/24/2014,1:36 PM

## 2014-08-24 NOTE — Care Management (Signed)
Admitted to [facility] with the diagnosis of CVA. Lives alone. Daughter is Willaim Sheng [phone]). Seen  Dr. Sampson Goon last week. [facility] last March. [facility] 3 years ago. No home oxygen. Life Alert in the home. Uses a rolling walker to aid in ambulation. Nursing assistants in the home  7 days a week 3 hours/day. Son comes to the home every day. Last  Fall was in September 2015. Family will transport. Physical therapy evaluation pending. Gwenette Greet RN MSN Care Management [phone] - [phone]

## 2014-08-24 NOTE — Plan of Care (Signed)
Problem: Discharge/Transitional Outcomes Goal: Barriers To Progression Addressed/Resolved Outcome: Progressing Patient goes by Rita Vang [name] from home alone, has caregivers during the day. Hx of multiple falls. High Fall Risk Hx of HTN, stroke, depression, dementia continue on home medications Pt is HOH    Goal: Other Discharge Outcomes/Goals Outcome: Progressing Pt is disoriented to time, c/o leg pain early in the morning, declines any treatment. Confused around 0300 when awoke for neuro checks, thought she was at home. Patient scored 1 on the NIH scale. NSR with 1st degree HB on telemetry. Family remains at bedside. 1 assist to the [facility].

## 2014-08-24 NOTE — Discharge Summary (Signed)
Discharge home after PT sees pt and evaluates for any home health needs. Pt is at baseline, NIH of 1. Verbalized no pain or any changes throughout night. Pt does have dementia so family is eager to get pt home and back on regular routine. Appetite good, swallowing study done and no deficits noted. Continue on ASA and a statin because of carotid occulusion.

## 2014-08-24 NOTE — Progress Notes (Signed)
[facility] - [place] at [facility]   PATIENT NAME: Rita Vang    MR#:  [id number]  DATE OF BIRTH:  09/19/1924  SUBJECTIVE:  CHIEF COMPLAINT:   Chief Complaint  Patient presents with  . Aphasia   Doing well this morning. No new complaints. Has very mild dysarthria.  REVIEW OF SYSTEMS:   Review of Systems  Constitutional: Negative for fever.  Respiratory: Negative for shortness of breath.   Cardiovascular: Negative for chest pain and palpitations.  Gastrointestinal: Negative for nausea, vomiting and abdominal pain.  Genitourinary: Negative for dysuria.  Neurological: Positive for speech change.    DRUG ALLERGIES:  No Known Allergies  VITALS:  Blood pressure 138/88, pulse 59, temperature 97.8 F (36.6 C), temperature source Oral, resp. rate 18, height 5\' 5"  (1.651 m), weight 64.864 kg (143 lb), SpO2 96 %.  PHYSICAL EXAMINATION:  GENERAL:  81 y.o.-year-old patient lying in the bed with no acute distress.  EYES: Pupils equal, round, reactive to light and accommodation. No scleral icterus. Extraocular muscles intact.  HEENT: Head atraumatic, normocephalic. Oropharynx and nasopharynx clear.  NECK:  Supple, no jugular venous distention. No thyroid enlargement, no tenderness.  LUNGS: Normal breath sounds bilaterally, no wheezing, rales,rhonchi or crepitation. No use of accessory muscles of respiration.  CARDIOVASCULAR: S1, S2 normal. No murmurs, rubs, or gallops.  ABDOMEN: Soft, nontender, nondistended. Bowel sounds present. No organomegaly or mass.  EXTREMITIES: No pedal edema, cyanosis, or clubbing.  NEUROLOGIC: Mild dysarthria, otherwise cranial nerves intact Muscle strength 5/5 in all extremities. Sensation intact. Gait not checked.  PSYCHIATRIC: The patient is alert and oriented x 3.  SKIN: No obvious rash, lesion, or ulcer.    LABORATORY PANEL:   CBC  Recent Labs Lab 08/24/14 0459  WBC 12.2*  HGB 12.0  HCT 36.5  PLT 115*    ------------------------------------------------------------------------------------------------------------------  Chemistries   Recent Labs Lab 08/23/14 1355 08/24/14 0459  NA 133* 139  K 4.3 4.0  CL 98* 104  CO2 24 27  GLUCOSE 111* 96  BUN 26* 27*  CREATININE 1.10* 0.96  CALCIUM 8.9 9.3  AST 28  --   ALT 21  --   ALKPHOS 86  --   BILITOT 0.5  --    ------------------------------------------------------------------------------------------------------------------  Cardiac Enzymes No results for input(s): TROPONINI in the last 168 hours. ------------------------------------------------------------------------------------------------------------------  RADIOLOGY:  Ct Head Wo Contrast  08/23/2014   CLINICAL DATA:  Slurred speech and mental status changes day.  EXAM: CT HEAD WITHOUT CONTRAST  TECHNIQUE: Contiguous axial images were obtained from the base of the skull through the vertex without intravenous contrast.  COMPARISON:  Brain MRI 02/29/2015 and head CT 07/10/2010.  FINDINGS: Stable age related cerebral atrophy, ventriculomegaly and periventricular white matter disease. No extra-axial fluid collections are identified. No CT findings for acute hemispheric infarction or intracranial hemorrhage. Remote lacunar type infarct noted in the right external capsule region. No mass lesions. The brainstem and cerebellum are normal.  No acute bony findings. The paranasal sinuses and mastoid air cells are clear except for a small amount of fluid in the left maxillary sinus. The globes are intact.  IMPRESSION: 1. Stable age related cerebral atrophy, ventriculomegaly and periventricular white matter disease. Remote lacunar type infarct noted in the right external capsule region. 2. No acute intracranial findings or mass lesions. 3. Small amount of fluid in the left maxillary sinus.   Electronically Signed   By: Rudie Meyer M.D.   On: 08/23/2014 14:17   Mr Brain Ilda Basset  Contrast  08/23/2014    CLINICAL DATA:  Difficulty with speech. History of CVA in past.High blood pressure.  EXAM: MRI HEAD WITHOUT CONTRAST  TECHNIQUE: Multiplanar, multiecho pulse sequences of the brain and surrounding structures were obtained without intravenous contrast.  COMPARISON:  None.  FINDINGS: There is a 5 x 10 mm area of restricted diffusion in the LEFT thalamus consistent with acute infarction. No other areas of restricted diffusion are seen.  No acute or chronic hemorrhage, mass lesion, or extra-axial fluid.  Global atrophy with hydrocephalus ex vacuo. Chronic microvascular ischemic change of a moderate to severe nature. Moderate-sized chronic lacunar infarct affects the posterior lentiform nucleus on the RIGHT. Ischemic demyelination or chronic nature also affects the pons. Remote RIGHT posterior frontal subcortical infarction with gliosis.  Unremarkable pituitary and pineal gland. Moderate pannus results in mild stenosis at the upper cervical region. Cervical spondylosis with stenosis is also suspected at C3-C4.  Flow voids are maintained in the carotid, basilar, and RIGHT vertebral arteries. Diminutive LEFT vertebral appears to supply only the inferior cerebellum.  BILATERAL cataract extraction. Small air-fluid level LEFT maxillary sinus suggesting acuity. Trace BILATERAL mastoid effusions.  Compared with prior CT, the infarct is difficult to visualize.  IMPRESSION: Acute LEFT thalamic nonhemorrhagic infarct.  Advanced atrophy with extensive chronic microvascular ischemic change, and evidence for remote infarctions.  Cervical spine disease with pannus and suspected stenosis at C3-C4.   Electronically Signed   By: Elsie Stain M.D.   On: 08/23/2014 19:51   US Carotid Bilateral  08/24/2014   CLINICAL DATA:  81 year old female with acute left thalamic infarct  EXAM: BILATERAL CAROTID DUPLEX ULTRASOUND  TECHNIQUE: Wallace Cullens scale imaging, color Doppler and duplex ultrasound were performed of bilateral carotid and vertebral  arteries in the neck.  COMPARISON:  Brain MRI 08/23/2014; prior carotid duplex ultrasound 07/10/2010  FINDINGS: Criteria: Quantification of carotid stenosis is based on velocity parameters that correlate the residual internal carotid diameter with NASCET-based stenosis levels, using the diameter of the distal internal carotid lumen as the denominator for stenosis measurement.  The following velocity measurements were obtained:  RIGHT  ICA:  83/17 cm/sec  CCA:  47/7 cm/sec  SYSTOLIC ICA/CCA RATIO:  1.8  DIASTOLIC ICA/CCA RATIO:  2.3  ECA:  75 cm/sec  LEFT  ICA:  56/13 cm/sec  CCA:  52/14 cm/sec  SYSTOLIC ICA/CCA RATIO:  1.1  DIASTOLIC ICA/CCA RATIO:  0.9  ECA:  82 cm/sec  RIGHT CAROTID ARTERY: Mild scattered atherosclerotic plaque in the mid common carotid artery. Mild plaque is also present in the proximal internal carotid artery. By peak systolic velocity criteria, the estimated stenosis remains less than 50%. The distal internal carotid artery is tortuous.  RIGHT VERTEBRAL ARTERY:  Patent with normal antegrade flow.  LEFT CAROTID ARTERY: Scant heterogeneous atherosclerotic plaque in the distal common carotid and proximal internal carotid arteries. By peak systolic velocity criteria, the estimated stenosis remains less than 50%.  LEFT VERTEBRAL ARTERY:  Patent with normal antegrade flow.  IMPRESSION: 1. Mild (1-49%) stenosis proximal right internal carotid artery secondary to mild focal heterogeneous atherosclerotic plaque. 2. Mild (1-49%) stenosis proximal left internal carotid artery secondary to mild focal heterogeneous atherosclerotic plaque. 3. Vertebral arteries are patent with normal antegrade flow. Signed,  Sterling Big, MD  [facility]  [facility]   Electronically Signed   By: Malachy Moan M.D.   On: 08/24/2014 10:38    EKG:   Orders placed or performed during the hospital encounter of  08/23/14  . ED EKG  . ED EKG  . EKG 12-Lead  . EKG  12-Lead    ASSESSMENT AND PLAN:   #1 acute CVA: MRI showing new small thalamic stroke. Speech seems to have improved significantly. Speech and Physical therapy evaluation pending. Echo is normal. Bilateral carotid arteries normal. EKG normal sinus rhythm. She has been seen by neurology and recommendation to restart aspirin. No additional medications. She can be discharged once cleared by speech and physical therapy.  #2 hypertension: Blood pressures have been well controlled. Losartan and Maxide held during hospitalization to allow for permissive hypertension in the setting of acute CVA. This can be restarted  #3 depression and dementia: Continue home medications   CODE STATUS: Full  TOTAL TIME TAKING CARE OF THIS PATIENT: 35 minutes.  Greater than 50% of time spent in care coordination and counseling. POSSIBLE D/C IN 1 DAYS, DEPENDING ON CLINICAL CONDITION.   Elby Showers M.D on 08/24/2014 at 11:46 AM  Between 7am to 6pm - Pager - [phone]  After 6pm go to [email or web address] - password [email or web address] [facility]  [facility]  Office  [phone]  CC: Primary care physician; Clydie Braun, MD

## 2014-08-26 NOTE — Discharge Summary (Signed)
[facility] - [place] at [facility]  DISCHARGE SUMMARY   PATIENT NAME: Rita Vang    MR#:  [id number]  DATE OF BIRTH:  [birth year]  DATE OF ADMISSION:  08/23/2014 ADMITTING PHYSICIAN: Adrian Saran, MD  DATE OF DISCHARGE: 08/24/2014  PRIMARY CARE PHYSICIAN: Clydie Braun, MD    ADMISSION DIAGNOSIS:  CVA (cerebral infarction) [I63.9] Dysarthria [R47.1] Cerebral infarction due to unspecified mechanism [I63.9]  DISCHARGE DIAGNOSIS:  Active Problems:   CVA (cerebral infarction)   SECONDARY DIAGNOSIS:   Past Medical History  Diagnosis Date  . CVA (cerebral vascular accident)   . Depression   . Dementia   . HTN (hypertension)     HOSPITAL COURSE:    #1 acute CVA: MRI showing new small thalamic stroke.  Speech and Physical therapy evaluation indicate no follow up needed. Echo is normal. Bilateral carotid arteries normal. EKG normal sinus rhythm. She has been seen by neurology and recommendation to restart aspirin, at higher dose of 325 mg daily, have also added high intensity statin.   #2 hypertension: Blood pressures have been well controlled. Losartan and Maxide held during hospitalization to allow for permissive hypertension in the setting of acute CVA. This can be restarted  #3 depression and dementia: Continue home medications  DISCHARGE CONDITIONS:   stable  CONSULTS OBTAINED:  Treatment Team:  Pauletta Browns, MD  DRUG ALLERGIES:  No Known Allergies  DISCHARGE MEDICATIONS:   Discharge Medication List as of 08/24/2014  2:05 PM    START taking these medications   Details  aspirin 325 MG tablet Take 1 tablet (325 mg total) by mouth daily., Starting 08/24/2014, Until Discontinued, Normal    rosuvastatin (CRESTOR) 10 MG tablet Take 1 tablet (10 mg total) by mouth daily at 6 PM., Starting 08/24/2014, Until Discontinued, Normal      CONTINUE these medications which have NOT CHANGED   Details  calcium carbonate (OS-CAL - DOSED IN  MG OF ELEMENTAL CALCIUM) 1250 (500 CA) MG tablet Take 1 tablet by mouth daily., Until Discontinued, Historical Med    cholecalciferol (VITAMIN D) 1000 UNITS tablet Take 1,000 Units by mouth daily., Until Discontinued, Historical Med    citalopram (CELEXA) 20 MG tablet Take 1 tablet by mouth daily., Starting 12/30/2013, Until Discontinued, Historical Med    docusate sodium (COLACE) 100 MG capsule Take 1 capsule by mouth daily., Until Discontinued, Historical Med    donepezil (ARICEPT) 10 MG tablet Take 1 tablet by mouth at bedtime., Starting 02/02/2014, Until Discontinued, Historical Med    esomeprazole (NEXIUM) 40 MG capsule Take 40 mg by mouth daily., Until Discontinued, Historical Med    fluticasone (FLONASE) 50 MCG/ACT nasal spray Place 1 spray into both nostrils daily., Starting 02/24/2014, Until Discontinued, Historical Med    Fluticasone-Salmeterol (ADVAIR) 100-50 MCG/DOSE AEPB Inhale 1 puff into the lungs 2 (two) times daily., Until Discontinued, Historical Med    Glucosamine-Chondroit-Vit C-Mn (GLUCOSAMINE 1500 COMPLEX PO) Take 1 capsule by mouth daily., Until Discontinued, Historical Med    ibuprofen (ADVIL,MOTRIN) 200 MG tablet Take 200-400 mg by mouth as needed., Until Discontinued, Historical Med    losartan (COZAAR) 100 MG tablet Take 1 tablet by mouth daily., Starting 03/23/2014, Until Discontinued, Historical Med    metoprolol succinate (TOPROL-XL) 50 MG 24 hr tablet Take 1 tablet by mouth daily., Starting 07/19/2014, Until Discontinued, Historical Med    Multiple Vitamins-Minerals (I-VITE PROTECT PO) Take 1 tablet by mouth daily., Until Discontinued, Historical Med    QUEtiapine (SEROQUEL) 25 MG tablet Take 1  tablet by mouth at bedtime., Starting 01/24/2014, Until Discontinued, Historical Med    traMADol (ULTRAM) 50 MG tablet Take 50 mg by mouth 2 (two) times daily., Until Discontinued, Historical Med    triamterene-hydrochlorothiazide (MAXZIDE-25) 37.5-25 MG per tablet Take  0.5 tablets by mouth daily., Starting 06/16/2014, Until Discontinued, Historical Med    valACYclovir (VALTREX) 500 MG tablet Take 500 mg by mouth as needed., Until Discontinued, Historical Med    cetirizine (ZYRTEC) 10 MG tablet Take 10 mg by mouth as needed for allergies., Until Discontinued, Historical Med    dimenhyDRINATE (DRAMAMINE) 50 MG tablet Take 1 tablet by mouth as needed., Until Discontinued, Historical Med    meclizine (ANTIVERT) 25 MG tablet Take 1 tablet by mouth as needed., Until Discontinued, Historical Med    polyethylene glycol (MIRALAX) packet Take 17 g by mouth as needed., Until Discontinued, Historical Med      STOP taking these medications     aspirin EC 81 MG tablet          DISCHARGE INSTRUCTIONS:    DIET:  Cardiac diet  DISCHARGE CONDITION:  Fair  ACTIVITY:  Activity as tolerated  OXYGEN:  Home Oxygen: No.   Oxygen Delivery: room air  DISCHARGE LOCATION:  home     Today   CHIEF COMPLAINT:   Chief Complaint  Patient presents with  . Aphasia    HISTORY OF PRESENT ILLNESS:  Rita Vang is a 81 y.o. female with a known history of CVA and HTN who presents with above complaint. Patient has been in normal state of health. This morning her caregiver came and checked on her and noted her speech to be different. She called the patient's son who then called EMS and saw the patient. The son reports the patient has slurred speech. Patient's speech has improved although she has some very mild slurred speech. She has no other neurological deficits.  VITAL SIGNS:  Blood pressure 154/79, pulse 69, temperature 98.7 F (37.1 C), temperature source Oral, resp. rate 18, height 5\' 5"  (1.651 m), weight 64.864 kg (143 lb), SpO2 96 %.  I/O:  No intake or output data in the 24 hours ending 08/26/14 1733  PHYSICAL EXAMINATION:  GENERAL: 81 y.o.-year-old patient lying in the bed with no acute distress.  EYES: Pupils equal, round, reactive to light and  accommodation. No scleral icterus. Extraocular muscles intact.  HEENT: Head atraumatic, normocephalic. Oropharynx and nasopharynx clear.  NECK: Supple, no jugular venous distention. No thyroid enlargement, no tenderness.  LUNGS: Normal breath sounds bilaterally, no wheezing, rales,rhonchi or crepitation. No use of accessory muscles of respiration.  CARDIOVASCULAR: S1, S2 normal. No murmurs, rubs, or gallops.  ABDOMEN: Soft, nontender, nondistended. Bowel sounds present. No organomegaly or mass.  EXTREMITIES: No pedal edema, cyanosis, or clubbing.  NEUROLOGIC: Mild dysarthria, otherwise cranial nerves intact Muscle strength 5/5 in all extremities. Sensation intact. Gait not checked.  PSYCHIATRIC: The patient is alert and oriented x 3.  SKIN: No obvious rash, lesion, or ulcer. r.   DATA REVIEW:   CBC  Recent Labs Lab 08/24/14 0459  WBC 12.2*  HGB 12.0  HCT 36.5  PLT 115*    Chemistries   Recent Labs Lab 08/23/14 1355 08/24/14 0459  NA 133* 139  K 4.3 4.0  CL 98* 104  CO2 24 27  GLUCOSE 111* 96  BUN 26* 27*  CREATININE 1.10* 0.96  CALCIUM 8.9 9.3  AST 28  --   ALT 21  --   ALKPHOS 86  --  BILITOT 0.5  --     Cardiac Enzymes No results for input(s): TROPONINI in the last 168 hours.  Microbiology Results  Results for orders placed or performed in visit on 12/04/12  Culture, blood (single)     Status: None   Collection Time: 12/04/12  8:40 AM  Result Value Ref Range Status   Micro Text Report   Final       COMMENT                   NO GROWTH AEROBICALLY/ANAEROBICALLY IN 5 DAYS   ANTIBIOTIC                                                      Culture, blood (single)     Status: None   Collection Time: 12/04/12  8:44 AM  Result Value Ref Range Status   Micro Text Report   Final       ORGANISM 1                STAPHYLOCOCCUS HOMINIS SSP HOMINIS   COMMENT                   IN AEROBIC AND ANAEROBIC BOTTLES   COMMENT                   -   GRAM STAIN                 GRAM POSITIVE COCCI IN PAIRS IN CLUSTERS   ANTIBIOTIC                    ORG#1    ORG#2     CIPROFLOXACIN                 R                  CLINDAMYCIN                   R                  ERYTHROMYCIN                  R                  GENTAMICIN                    S                  LEVOFLOXACIN                  I                  OXACILLIN                     R                  VANCOMYCIN                    S                      RADIOLOGY:  No results found.  EKG:   Orders placed or performed during the hospital encounter of 08/23/14  .  ED EKG  . ED EKG  . EKG 12-Lead  . EKG 12-Lead  . EKG      Management plans discussed with the patient, family and they are in agreement.  CODE STATUS: full  TOTAL TIME TAKING CARE OF THIS PATIENT: 35 minutes.  Greater than 50% of time spent in care coordination and counseling.  Elby Showers M.D on 08/26/2014 at 5:33 PM  Between 7am to 6pm - Pager - [phone]  After 6pm go to [email or web address] - password [email or web address] [facility]  [place] [facility]  Office  [phone]  CC: Primary care physician; Clydie Braun, MD

## 2016-05-07 ENCOUNTER — Other Ambulatory Visit: Payer: Self-pay | Admitting: Neurology

## 2016-05-07 DIAGNOSIS — I671 Cerebral aneurysm, nonruptured: Secondary | ICD-10-CM

## 2016-05-18 ENCOUNTER — Ambulatory Visit: Payer: Medicare Other

## 2016-05-22 ENCOUNTER — Ambulatory Visit
Admission: RE | Admit: 2016-05-22 | Discharge: 2016-05-22 | Disposition: A | Discharge status: Home / Self Care | Payer: Medicare Other | Source: Ambulatory Visit | Attending: Neurology | Admitting: Neurology

## 2016-05-22 DIAGNOSIS — I671 Cerebral aneurysm, nonruptured: Secondary | ICD-10-CM | POA: Insufficient documentation

## 2017-03-06 ENCOUNTER — Encounter: Payer: Self-pay | Admitting: *Deleted

## 2017-03-06 ENCOUNTER — Emergency Department: Payer: Medicare Other

## 2017-03-06 ENCOUNTER — Inpatient Hospital Stay
Admission: EM | Admit: 2017-03-06 | Discharge: 2017-03-12 | DRG: 871 | Disposition: A | Discharge status: Skilled Nursing Facility | Payer: Medicare Other | Attending: Internal Medicine | Admitting: Internal Medicine

## 2017-03-06 DIAGNOSIS — F329 Major depressive disorder, single episode, unspecified: Secondary | ICD-10-CM | POA: Diagnosis present

## 2017-03-06 DIAGNOSIS — A419 Sepsis, unspecified organism: Secondary | ICD-10-CM | POA: Diagnosis present

## 2017-03-06 DIAGNOSIS — J189 Pneumonia, unspecified organism: Secondary | ICD-10-CM | POA: Diagnosis present

## 2017-03-06 DIAGNOSIS — Z8673 Personal history of transient ischemic attack (TIA), and cerebral infarction without residual deficits: Secondary | ICD-10-CM

## 2017-03-06 DIAGNOSIS — R54 Age-related physical debility: Secondary | ICD-10-CM | POA: Diagnosis present

## 2017-03-06 DIAGNOSIS — I248 Other forms of acute ischemic heart disease: Secondary | ICD-10-CM | POA: Diagnosis present

## 2017-03-06 DIAGNOSIS — Z7982 Long term (current) use of aspirin: Secondary | ICD-10-CM

## 2017-03-06 DIAGNOSIS — R0602 Shortness of breath: Secondary | ICD-10-CM

## 2017-03-06 DIAGNOSIS — I482 Chronic atrial fibrillation: Secondary | ICD-10-CM | POA: Diagnosis present

## 2017-03-06 DIAGNOSIS — F419 Anxiety disorder, unspecified: Secondary | ICD-10-CM | POA: Diagnosis present

## 2017-03-06 DIAGNOSIS — F0390 Unspecified dementia without behavioral disturbance: Secondary | ICD-10-CM | POA: Diagnosis present

## 2017-03-06 DIAGNOSIS — E785 Hyperlipidemia, unspecified: Secondary | ICD-10-CM | POA: Diagnosis present

## 2017-03-06 DIAGNOSIS — J188 Other pneumonia, unspecified organism: Secondary | ICD-10-CM

## 2017-03-06 DIAGNOSIS — L8990 Pressure ulcer of unspecified site, unspecified stage: Secondary | ICD-10-CM

## 2017-03-06 DIAGNOSIS — Z6823 Body mass index (BMI) 23.0-23.9, adult: Secondary | ICD-10-CM | POA: Diagnosis not present

## 2017-03-06 DIAGNOSIS — I129 Hypertensive chronic kidney disease with stage 1 through stage 4 chronic kidney disease, or unspecified chronic kidney disease: Secondary | ICD-10-CM | POA: Diagnosis present

## 2017-03-06 DIAGNOSIS — L89222 Pressure ulcer of left hip, stage 2: Secondary | ICD-10-CM | POA: Diagnosis present

## 2017-03-06 DIAGNOSIS — J208 Acute bronchitis due to other specified organisms: Secondary | ICD-10-CM | POA: Diagnosis present

## 2017-03-06 DIAGNOSIS — B9729 Other coronavirus as the cause of diseases classified elsewhere: Secondary | ICD-10-CM | POA: Diagnosis present

## 2017-03-06 DIAGNOSIS — Z7951 Long term (current) use of inhaled steroids: Secondary | ICD-10-CM | POA: Diagnosis not present

## 2017-03-06 DIAGNOSIS — R079 Chest pain, unspecified: Secondary | ICD-10-CM | POA: Diagnosis present

## 2017-03-06 DIAGNOSIS — Z8249 Family history of ischemic heart disease and other diseases of the circulatory system: Secondary | ICD-10-CM

## 2017-03-06 DIAGNOSIS — I959 Hypotension, unspecified: Secondary | ICD-10-CM | POA: Diagnosis present

## 2017-03-06 DIAGNOSIS — I10 Essential (primary) hypertension: Secondary | ICD-10-CM | POA: Diagnosis present

## 2017-03-06 DIAGNOSIS — K219 Gastro-esophageal reflux disease without esophagitis: Secondary | ICD-10-CM | POA: Diagnosis present

## 2017-03-06 DIAGNOSIS — R0902 Hypoxemia: Secondary | ICD-10-CM

## 2017-03-06 DIAGNOSIS — I5033 Acute on chronic diastolic (congestive) heart failure: Secondary | ICD-10-CM | POA: Diagnosis present

## 2017-03-06 DIAGNOSIS — Z820 Family history of epilepsy and other diseases of the nervous system: Secondary | ICD-10-CM

## 2017-03-06 DIAGNOSIS — N183 Chronic kidney disease, stage 3 unspecified: Secondary | ICD-10-CM | POA: Diagnosis present

## 2017-03-06 HISTORY — DX: Gastro-esophageal reflux disease without esophagitis: K21.9

## 2017-03-06 HISTORY — DX: Anxiety disorder, unspecified: F41.9

## 2017-03-06 HISTORY — DX: Hyperlipidemia, unspecified: E78.5

## 2017-03-06 LAB — COMPREHENSIVE METABOLIC PANEL
ALBUMIN: 3.5 g/dL (ref 3.5–5.0)
ALT: 23 U/L (ref 14–54)
ANION GAP: 11 (ref 5–15)
AST: 37 U/L (ref 15–41)
Alkaline Phosphatase: 105 U/L (ref 38–126)
BUN: 35 mg/dL — ABNORMAL HIGH (ref 6–20)
CO2: 22 mmol/L (ref 22–32)
Calcium: 8.9 mg/dL (ref 8.9–10.3)
Chloride: 104 mmol/L (ref 101–111)
Creatinine, Ser: 1.26 mg/dL — ABNORMAL HIGH (ref 0.44–1.00)
GFR calc Af Amer: 42 mL/min — ABNORMAL LOW (ref >60)
GFR, EST NON AFRICAN AMERICAN: 36 mL/min — AB (ref >60)
Glucose, Bld: 115 mg/dL — ABNORMAL HIGH (ref 65–99)
POTASSIUM: 3.8 mmol/L (ref 3.5–5.1)
Sodium: 137 mmol/L (ref 135–145)
TOTAL PROTEIN: 6.6 g/dL (ref 6.5–8.1)
Total Bilirubin: 0.5 mg/dL (ref 0.3–1.2)

## 2017-03-06 LAB — CBC WITH DIFFERENTIAL/PLATELET
BASOS ABS: 0 10*3/uL (ref 0–0.1)
Basophils Relative: 0 %
Eosinophils Absolute: 0 10*3/uL (ref 0–0.7)
Eosinophils Relative: 0 %
HEMATOCRIT: 39.7 % (ref 35.0–47.0)
Hemoglobin: 13.3 g/dL (ref 12.0–16.0)
LYMPHS PCT: 3 %
Lymphs Abs: 0.5 10*3/uL — ABNORMAL LOW (ref 1.0–3.6)
MCH: 32.5 pg (ref 26.0–34.0)
MCHC: 33.5 g/dL (ref 32.0–36.0)
MCV: 97.1 fL (ref 80.0–100.0)
Monocytes Absolute: 0.9 10*3/uL (ref 0.2–0.9)
Monocytes Relative: 5 %
NEUTROS ABS: 15.8 10*3/uL — AB (ref 1.4–6.5)
Neutrophils Relative %: 92 %
Platelets: 111 10*3/uL — ABNORMAL LOW (ref 150–440)
RBC: 4.09 MIL/uL (ref 3.80–5.20)
RDW: 13.4 % (ref 11.5–14.5)
WBC: 17.2 10*3/uL — AB (ref 3.6–11.0)

## 2017-03-06 LAB — URINALYSIS, ROUTINE W REFLEX MICROSCOPIC
Bilirubin Urine: NEGATIVE
GLUCOSE, UA: NEGATIVE mg/dL
Hgb urine dipstick: NEGATIVE
Ketones, ur: NEGATIVE mg/dL
LEUKOCYTES UA: NEGATIVE
Nitrite: NEGATIVE
PROTEIN: NEGATIVE mg/dL
Specific Gravity, Urine: 1.012 (ref 1.005–1.030)
pH: 5 (ref 5.0–8.0)

## 2017-03-06 LAB — INFLUENZA PANEL BY PCR (TYPE A & B)
INFLBPCR: NEGATIVE
Influenza A By PCR: NEGATIVE

## 2017-03-06 LAB — BRAIN NATRIURETIC PEPTIDE: B NATRIURETIC PEPTIDE 5: 235 pg/mL — AB (ref 0.0–100.0)

## 2017-03-06 LAB — LACTIC ACID, PLASMA: Lactic Acid, Venous: 1.4 mmol/L (ref 0.5–1.9)

## 2017-03-06 LAB — PROCALCITONIN: PROCALCITONIN: 1.4 ng/mL

## 2017-03-06 MED ORDER — GUAIFENESIN-DM 100-10 MG/5ML PO SYRP
5.0000 mL | ORAL_SOLUTION | ORAL | Status: DC | PRN
  Administered 2017-03-10: 5 mL via ORAL
  Filled 2017-03-06: qty 5

## 2017-03-06 MED ORDER — SODIUM CHLORIDE 0.9 % IV BOLUS (SEPSIS)
500.0000 mL | Freq: Once | INTRAVENOUS | Status: AC
  Administered 2017-03-07: 500 mL via INTRAVENOUS

## 2017-03-06 MED ORDER — ONDANSETRON HCL 4 MG PO TABS: 4.0000 mg | ORAL_TABLET | Freq: Four times a day (QID) | ORAL | Status: DC | PRN

## 2017-03-06 MED ORDER — VANCOMYCIN HCL IN DEXTROSE 750-5 MG/150ML-% IV SOLN
750.0000 mg | INTRAVENOUS | Status: DC
  Administered 2017-03-07: 750 mg via INTRAVENOUS
  Filled 2017-03-06: qty 150

## 2017-03-06 MED ORDER — DONEPEZIL HCL 5 MG PO TABS
10.0000 mg | ORAL_TABLET | Freq: Every day | ORAL | Status: DC
  Administered 2017-03-07 – 2017-03-11 (×5): 10 mg via ORAL
  Filled 2017-03-06 (×7): qty 2

## 2017-03-06 MED ORDER — ASPIRIN 325 MG PO TABS
325.0000 mg | ORAL_TABLET | Freq: Every day | ORAL | Status: DC
  Administered 2017-03-07 – 2017-03-12 (×6): 325 mg via ORAL
  Filled 2017-03-06 (×6): qty 1

## 2017-03-06 MED ORDER — VANCOMYCIN HCL IN DEXTROSE 1-5 GM/200ML-% IV SOLN
1000.0000 mg | Freq: Once | INTRAVENOUS | Status: AC
  Administered 2017-03-06: 1000 mg via INTRAVENOUS
  Filled 2017-03-06: qty 200

## 2017-03-06 MED ORDER — SODIUM CHLORIDE 0.9 % IV BOLUS (SEPSIS)
1000.0000 mL | Freq: Once | INTRAVENOUS | Status: AC
  Administered 2017-03-06: 1000 mL via INTRAVENOUS

## 2017-03-06 MED ORDER — QUETIAPINE FUMARATE 25 MG PO TABS
25.0000 mg | ORAL_TABLET | Freq: Every day | ORAL | Status: DC
  Administered 2017-03-07 – 2017-03-08 (×2): 25 mg via ORAL
  Filled 2017-03-06 (×2): qty 1

## 2017-03-06 MED ORDER — PANTOPRAZOLE SODIUM 40 MG PO TBEC
40.0000 mg | DELAYED_RELEASE_TABLET | Freq: Every day | ORAL | Status: DC
  Administered 2017-03-07 – 2017-03-12 (×6): 40 mg via ORAL
  Filled 2017-03-06 (×6): qty 1

## 2017-03-06 MED ORDER — SODIUM CHLORIDE 0.9 % IV BOLUS (SEPSIS): 1000.0000 mL | Freq: Once | INTRAVENOUS | Status: DC

## 2017-03-06 MED ORDER — HEPARIN SODIUM (PORCINE) 5000 UNIT/ML IJ SOLN
5000.0000 [IU] | Freq: Three times a day (TID) | INTRAMUSCULAR | Status: DC
  Administered 2017-03-07 – 2017-03-12 (×16): 5000 [IU] via SUBCUTANEOUS
  Filled 2017-03-06 (×16): qty 1

## 2017-03-06 MED ORDER — IPRATROPIUM-ALBUTEROL 0.5-2.5 (3) MG/3ML IN SOLN
3.0000 mL | RESPIRATORY_TRACT | Status: DC | PRN
  Filled 2017-03-06: qty 3

## 2017-03-06 MED ORDER — MOMETASONE FURO-FORMOTEROL FUM 100-5 MCG/ACT IN AERO
2.0000 | INHALATION_SPRAY | Freq: Two times a day (BID) | RESPIRATORY_TRACT | Status: DC
  Administered 2017-03-07 – 2017-03-12 (×11): 2 via RESPIRATORY_TRACT
  Filled 2017-03-06: qty 8.8

## 2017-03-06 MED ORDER — ALBUTEROL SULFATE (2.5 MG/3ML) 0.083% IN NEBU
2.5000 mg | INHALATION_SOLUTION | Freq: Once | RESPIRATORY_TRACT | Status: AC
  Administered 2017-03-06: 2.5 mg via RESPIRATORY_TRACT
  Filled 2017-03-06: qty 3

## 2017-03-06 MED ORDER — SODIUM CHLORIDE 0.9 % IV SOLN
2.0000 g | INTRAVENOUS | Status: DC
  Administered 2017-03-07 – 2017-03-10 (×4): 2 g via INTRAVENOUS
  Filled 2017-03-06 (×5): qty 2

## 2017-03-06 MED ORDER — METHYLPREDNISOLONE SODIUM SUCC 125 MG IJ SOLR
125.0000 mg | Freq: Once | INTRAMUSCULAR | Status: AC
  Administered 2017-03-06: 125 mg via INTRAVENOUS
  Filled 2017-03-06: qty 2

## 2017-03-06 MED ORDER — IPRATROPIUM BROMIDE 0.02 % IN SOLN
0.5000 mg | RESPIRATORY_TRACT | Status: DC
  Administered 2017-03-07 – 2017-03-10 (×19): 0.5 mg via RESPIRATORY_TRACT
  Filled 2017-03-06 (×19): qty 2.5

## 2017-03-06 MED ORDER — ONDANSETRON HCL 4 MG/2ML IJ SOLN: 4.0000 mg | Freq: Four times a day (QID) | INTRAMUSCULAR | Status: DC | PRN

## 2017-03-06 MED ORDER — CITALOPRAM HYDROBROMIDE 20 MG PO TABS
20.0000 mg | ORAL_TABLET | Freq: Every day | ORAL | Status: DC
  Administered 2017-03-07 – 2017-03-12 (×6): 20 mg via ORAL
  Filled 2017-03-06 (×6): qty 1

## 2017-03-06 MED ORDER — METHYLPREDNISOLONE SODIUM SUCC 125 MG IJ SOLR
60.0000 mg | Freq: Four times a day (QID) | INTRAMUSCULAR | Status: DC
  Administered 2017-03-07 – 2017-03-08 (×6): 60 mg via INTRAVENOUS
  Filled 2017-03-06 (×7): qty 2

## 2017-03-06 MED ORDER — SODIUM CHLORIDE 0.9 % IV SOLN
INTRAVENOUS | Status: AC
  Administered 2017-03-06 – 2017-03-07 (×2): via INTRAVENOUS

## 2017-03-06 MED ORDER — LEVALBUTEROL HCL 1.25 MG/0.5ML IN NEBU: 1.2500 mg | INHALATION_SOLUTION | Freq: Four times a day (QID) | RESPIRATORY_TRACT | Status: DC

## 2017-03-06 MED ORDER — LEVALBUTEROL HCL 1.25 MG/0.5ML IN NEBU
1.2500 mg | INHALATION_SOLUTION | RESPIRATORY_TRACT | Status: DC
  Administered 2017-03-07 – 2017-03-10 (×19): 1.25 mg via RESPIRATORY_TRACT
  Filled 2017-03-06 (×19): qty 0.5

## 2017-03-06 MED ORDER — ACETAMINOPHEN 325 MG PO TABS
650.0000 mg | ORAL_TABLET | Freq: Four times a day (QID) | ORAL | Status: DC | PRN
  Administered 2017-03-10: 650 mg via ORAL
  Filled 2017-03-06: qty 2

## 2017-03-06 MED ORDER — METOPROLOL TARTRATE 5 MG/5ML IV SOLN
5.0000 mg | INTRAVENOUS | Status: DC | PRN
  Filled 2017-03-06: qty 5

## 2017-03-06 MED ORDER — CEFEPIME HCL 2 G IJ SOLR
2.0000 g | Freq: Once | INTRAMUSCULAR | Status: AC
  Administered 2017-03-06: 2 g via INTRAVENOUS
  Filled 2017-03-06: qty 2

## 2017-03-06 MED ORDER — ACETAMINOPHEN 650 MG RE SUPP
650.0000 mg | Freq: Four times a day (QID) | RECTAL | Status: DC | PRN
  Administered 2017-03-06: 650 mg via RECTAL
  Filled 2017-03-06: qty 1

## 2017-03-06 NOTE — Progress Notes (Signed)
CODE SEPSIS - PHARMACY COMMUNICATION  **Broad Spectrum Antibiotics should be administered within 1 hour of Sepsis diagnosis**  Time Code Sepsis Called/Page Received: 1927  Antibiotics Ordered: vancomycin and cefepime  Time of 1st antibiotic administration: 2004  Additional action taken by pharmacy:   If necessary, Name of Provider/Nurse Contacted:     Valentina Guhristy, Pheng Prokop D ,PharmD Clinical Pharmacist  03/06/2017  7:29 PM

## 2017-03-06 NOTE — ED Triage Notes (Signed)
Per EMS report, patient developed a fever and weakness today, has had a cough for an unknown amount of time as well as vomiting. Patient lives at home with family. Patient was ambulatory yesterday, but could only stand and pivot today.

## 2017-03-06 NOTE — ED Provider Notes (Addendum)
[facility] Emergency Department Provider Note  ___________________________________________   First MD Initiated Contact with Patient 03/06/17 1928     (approximate)  I have reviewed the triage vital signs and the nursing notes.   HISTORY  Chief Complaint Fever and Cough   HPI Rita Vang is a 84 y.o. female with a history of dementia, depression and hypertension who is presenting to the emergency department with 1 day of shortness of breath, cough and fever.  Patient says that her cough has been productive.  Denies any known sick contacts.  Denies any pain.  Denies any vomiting or diarrhea.  Brought in by EMS who found the patient to be hypoxic with an oxygen saturation of 87% on room air initially and placed on 3 L of oxygen which resolved her saturation to 96%.   Past Medical History:  Diagnosis Date  . Anxiety   . CVA (cerebral vascular accident) (HCC)   . Dementia   . Depression   . GERD (gastroesophageal reflux disease)   . HLD (hyperlipidemia)   . HTN (hypertension)     Patient Active Problem List   Diagnosis Date Noted  . Sepsis (HCC) 03/06/2017  . CAP (community acquired pneumonia) 03/06/2017  . HTN (hypertension) 03/06/2017  . Anxiety 03/06/2017  . HLD (hyperlipidemia) 03/06/2017  . GERD (gastroesophageal reflux disease) 03/06/2017  . CKD (chronic kidney disease), stage III (HCC) 03/06/2017  . CVA (cerebral infarction) 08/23/2014    Past Surgical History:  Procedure Laterality Date  . CARPAL TUNNEL RELEASE    . CHOLECYSTECTOMY      Prior to Admission medications   Medication Sig Start Date End Date Taking? Authorizing Provider  aspirin 325 MG tablet Take 1 tablet (325 mg total) by mouth daily. 08/24/14   Gale JourneyWalsh, Catherine P, MD  calcium carbonate (OS-CAL - DOSED IN MG OF ELEMENTAL CALCIUM) 1250 (500 CA) MG tablet Take 1 tablet by mouth daily.    [provider]  cetirizine (ZYRTEC) 10 MG tablet Take 10 mg by mouth as  needed for allergies.    [provider]  cholecalciferol (VITAMIN D) 1000 UNITS tablet Take 1,000 Units by mouth daily.    [provider]  citalopram (CELEXA) 20 MG tablet Take 1 tablet by mouth daily. 12/30/13   [provider]  dimenhyDRINATE (DRAMAMINE) 50 MG tablet Take 1 tablet by mouth as needed.    [provider]  docusate sodium (COLACE) 100 MG capsule Take 1 capsule by mouth daily.    [provider]  donepezil (ARICEPT) 10 MG tablet Take 1 tablet by mouth at bedtime. 02/02/14   [provider]  esomeprazole (NEXIUM) 40 MG capsule Take 40 mg by mouth daily.    [provider]  fluticasone (FLONASE) 50 MCG/ACT nasal spray Place 1 spray into both nostrils daily. 02/24/14   [provider]  Fluticasone-Salmeterol (ADVAIR) 100-50 MCG/DOSE AEPB Inhale 1 puff into the lungs 2 (two) times daily.    [provider]  Glucosamine-Chondroit-Vit C-Mn (GLUCOSAMINE 1500 COMPLEX PO) Take 1 capsule by mouth daily.    [provider]  ibuprofen (ADVIL,MOTRIN) 200 MG tablet Take 200-400 mg by mouth as needed.    [provider]  losartan (COZAAR) 100 MG tablet Take 1 tablet by mouth daily. 03/23/14   [provider]  meclizine (ANTIVERT) 25 MG tablet Take 1 tablet by mouth as needed.    [provider]  metoprolol succinate (TOPROL-XL) 50 MG 24 hr tablet Take 1  tablet by mouth daily. 07/19/14   [provider]  Multiple Vitamins-Minerals (I-VITE PROTECT PO) Take 1 tablet by mouth daily.    [provider]  polyethylene glycol (MIRALAX) packet Take 17 g by mouth as needed.    [provider]  QUEtiapine (SEROQUEL) 25 MG tablet Take 1 tablet by mouth at bedtime. 01/24/14   [provider]  rosuvastatin (CRESTOR) 10 MG tablet Take 1 tablet (10 mg total) by mouth daily at 6 PM. 08/24/14   Gale Journey, MD  traMADol (ULTRAM) 50 MG tablet Take 50 mg by mouth 2  (two) times daily.    [provider]  triamterene-hydrochlorothiazide (MAXZIDE-25) 37.5-25 MG per tablet Take 0.5 tablets by mouth daily. 06/16/14   [provider]  valACYclovir (VALTREX) 500 MG tablet Take 500 mg by mouth as needed.    [provider]    Allergies Oxycontin [oxycodone hcl]  Family History  Problem Relation Age of Onset  . Heart attack Father   . Alzheimer's disease Mother     Social History Social History   Tobacco Use  . Smoking status: Never Smoker  . Smokeless tobacco: Never Used  Substance Use Topics  . Alcohol use: No  . Drug use: Not on file    Review of Systems  Constitutional: Fever Eyes: No visual changes. ENT: No sore throat. Cardiovascular: Denies chest pain. Respiratory: As above Gastrointestinal: No abdominal pain.  No nausea, no vomiting.  No diarrhea.  No constipation. Genitourinary: Negative for dysuria. Musculoskeletal: Negative for back pain. Skin: Negative for rash. Neurological: Negative for headaches, focal weakness or numbness.   ____________________________________________   PHYSICAL EXAM:  VITAL SIGNS: ED Triage Vitals  Enc Vitals Group     BP --      Pulse Rate 03/06/17 1918 (!) 123     Resp 03/06/17 1918 (!) 28     Temp 03/06/17 1918 (!) 101.8 F (38.8 C)     Temp Source 03/06/17 1918 Rectal     SpO2 03/06/17 1913 (!) 89 %     Weight 03/06/17 1921 130 lb (59 kg)     Height --      Head Circumference --      Peak Flow --      Pain Score --      Pain Loc --      Pain Edu? --      Excl. in GC? --     Constitutional: Alert and oriented.  Ill/weak appearing.  However is fully conversive and awake. Eyes: Conjunctivae are normal.  Head: Atraumatic. Nose: No congestion/rhinnorhea. Mouth/Throat: Mucous membranes are moist.  Neck: No stridor.   Cardiovascular: Tachycardic, regular rhythm. Grossly normal heart sounds.   Respiratory: Tachypnea with a prolonged expiratory phase and coarse,  diffuse wheezing. Gastrointestinal: Soft and nontender. No distention.  Musculoskeletal: No lower extremity tenderness nor edema.  No joint effusions. Neurologic:  Normal speech and language. No gross focal neurologic deficits are appreciated. Skin:  Skin is warm, dry and intact. No rash noted. Psychiatric: Mood and affect are normal. Speech and behavior are normal.  ____________________________________________   LABS (all labs ordered are listed, but only abnormal results are displayed)  Labs Reviewed  COMPREHENSIVE METABOLIC PANEL - Abnormal; Notable for the following components:      Result Value   Glucose, Bld 115 (*)    BUN 35 (*)    Creatinine, Ser 1.26 (*)    GFR calc non Af Amer 36 (*)  GFR calc Af Amer 42 (*)    All other components within normal limits  CBC WITH DIFFERENTIAL/PLATELET - Abnormal; Notable for the following components:   WBC 17.2 (*)    Platelets 111 (*)    Neutro Abs 15.8 (*)    Lymphs Abs 0.5 (*)    All other components within normal limits  URINALYSIS, ROUTINE W REFLEX MICROSCOPIC - Abnormal; Notable for the following components:   Color, Urine YELLOW (*)    APPearance CLEAR (*)    All other components within normal limits  CULTURE, BLOOD (ROUTINE X 2)  CULTURE, BLOOD (ROUTINE X 2)  URINE CULTURE  LACTIC ACID, PLASMA  INFLUENZA PANEL BY PCR (TYPE A & B)  LACTIC ACID, PLASMA  BRAIN NATRIURETIC PEPTIDE   ____________________________________________  EKG  ED ECG REPORT I, Arelia Longest, the attending physician, personally viewed and interpreted this ECG.   Date: 03/06/2017  EKG Time: 1918  Rate: 131  Rhythm: sinus tachycardia  Axis: Normal  Intervals:none  ST&T Change: No ST segment elevation.  Minimal ST depressions most pronounced in the lateral leads V5 and V6.  Possibly rate related.  T wave inversions in 1, aVL as well as V2 with biphasic T waves in V3 and V4.  ____________________________________________  RADIOLOGY  Hazy  bilateral perihilar opacification right greater than left.  Likely asymmetric edema.  However, right infrahilar infection is possible as well. ____________________________________________   PROCEDURES  Procedure(s) performed:   .Critical Care Performed by: Myrna Blazer, MD Authorized by: Myrna Blazer, MD   Critical care provider statement:    Critical care time (minutes):  35   Critical care was necessary to treat or prevent imminent or life-threatening deterioration of the following conditions:  Sepsis   Critical care was time spent personally by me on the following activities:  Examination of patient, ordering and performing treatments and interventions, ordering and review of laboratory studies, ordering and review of radiographic studies, pulse oximetry, review of old charts and re-evaluation of patient's condition    Critical Care performed:   ____________________________________________   INITIAL IMPRESSION / ASSESSMENT AND PLAN / ED COURSE  Pertinent labs & imaging results that were available during my care of the patient were reviewed by me and considered in my medical decision making (see chart for details).  Differential includes, but is not limited to, viral syndrome, bronchitis including COPD exacerbation, pneumonia, reactive airway disease including asthma, CHF including exacerbation with or without pulmonary/interstitial edema, pneumothorax, ACS, thoracic trauma, and pulmonary embolism. As part of my medical decision making, I reviewed the following data within the electronic MEDICAL RECORD NUMBER Notes from prior ED visits  Sepsis alert called and patient given broad-spectrum antibiotics for pneumonia given high acuity presentation of patient.   ----------------------------------------- 9:02 PM on 03/06/2017 -----------------------------------------  Patient still tachycardic with blood pressures in the low 100s, systolic.  I believe that the  x-ray likely represents multi focal pneumonia as the patient is septic by multiple parameters.  Broad-spectrum antibiotics given as well as submental oxygen.  Patient's lungs were re-auscultated and she is still wheezing throughout as well as tachypneic.  She will be given steroids as well as an additional albuterol treatment.  I also discussed the case with the patient's son who says that the patient would be amenable to being placed on a ventilator if it was reversible but would not want to be on prolonged life support.  Patient will be admitted to the hospital.  Signed out to Dr.  Pyreddy.  ____________________________________________   FINAL CLINICAL IMPRESSION(S) / ED DIAGNOSES  Final diagnoses:  Sepsis, due to unspecified organism [facility])  Multifocal pneumonia  Hypoxia      NEW MEDICATIONS STARTED DURING THIS VISIT:  New Prescriptions   No medications on file     Note:  This document was prepared using Dragon voice recognition software and may include unintentional dictation errors.     Myrna Blazer, MD 03/06/17 2103    Myrna Blazer, MD 03/12/17 [phone]

## 2017-03-06 NOTE — Progress Notes (Signed)
Pharmacy Antibiotic Note  Rita SnareFrances L Vang is a 84 y.o. female admitted on 03/06/2017 with pneumonia.  Pharmacy has been consulted for vancomycin and cefepime dosing.  Plan: DW 59 kg   Vd 41L kei 0.024 hr-1  T1/2 29 hours Vancomycin 750 mg q 36 hours ordered with stacked dosing. Level before 5th dose. Goal trough 15-20.  Cefepime 2 grams q 24 hours ordered.  MRSA PCR and procalcitonin ordered per new protocol.  Height: 5\' 2"  (157.5 cm) Weight: 130 lb (59 kg) IBW/kg (Calculated) : 50.1  Temp (24hrs), Avg:102 F (38.9 C), Min:101.8 F (38.8 C), Max:102.1 F (38.9 C)  Recent Labs  Lab 03/06/17 1928  WBC 17.2*  CREATININE 1.26*  LATICACIDVEN 1.4    Estimated Creatinine Clearance: 23.5 mL/min (A) (by C-G formula based on SCr of 1.26 mg/dL (H)).    Allergies  Allergen Reactions  . Oxycontin [Oxycodone Hcl] Other (See Comments)    Hallucinations     Antimicrobials this admission: Vancomycin, cefepime 2/21  >>    >>   Dose adjustments this admission:   Microbiology results: 2/21 BCx: pending 2/21 UCx: pending  2/21 MRSA PCR: pending      2/21 UA: (-) 2/21 CXR: R>L perihilar opacification  Thank you for allowing pharmacy to be a part of this patient'Vang care.  Rita Vang 03/06/2017 10:11 PM

## 2017-03-06 NOTE — H&P (Signed)
[place] at [place] NAME: Rita Vang    MR#:  [id number]  DATE OF BIRTH:  June 29, 1924  DATE OF ADMISSION:  03/06/2017  PRIMARY CARE PHYSICIAN: Leonel Ramsay, MD   REQUESTING/REFERRING PHYSICIAN: Clearnce Hasten, MD  CHIEF COMPLAINT:   Chief Complaint  Patient presents with  . Fever  . Cough    HISTORY OF PRESENT ILLNESS:  Rita Vang  is a 84 y.o. female who presents with a week of progressive mild symptoms, mostly wheezing, followed by significant decline today including shortness of breath, confusion, cough.  In the ED she was found to have pneumonia, she met sepsis criteria, and hospitalist were called for admission  PAST MEDICAL HISTORY:   Past Medical History:  Diagnosis Date  . Anxiety   . CVA (cerebral vascular accident) ([place])   . Dementia   . Depression   . GERD (gastroesophageal reflux disease)   . HLD (hyperlipidemia)   . HTN (hypertension)     PAST SURGICAL HISTORY:   Past Surgical History:  Procedure Laterality Date  . CARPAL TUNNEL RELEASE    . CHOLECYSTECTOMY      SOCIAL HISTORY:   Social History   Tobacco Use  . Smoking status: Never Smoker  . Smokeless tobacco: Never Used  Substance Use Topics  . Alcohol use: No    FAMILY HISTORY:   Family History  Problem Relation Age of Onset  . Heart attack Father   . Alzheimer's disease Mother     DRUG ALLERGIES:   Allergies  Allergen Reactions  . Oxycontin [Oxycodone Hcl] Other (See Comments)    Hallucinations     MEDICATIONS AT HOME:   Prior to Admission medications   Medication Sig Start Date End Date Taking? Authorizing Provider  aspirin 325 MG tablet Take 1 tablet (325 mg total) by mouth daily. 08/24/14   Aldean Jewett, MD  calcium carbonate (OS-CAL - DOSED IN MG OF ELEMENTAL CALCIUM) 1250 (500 CA) MG tablet Take 1 tablet by mouth daily.    [provider]  cetirizine (ZYRTEC) 10 MG tablet Take 10 mg by mouth as  needed for allergies.    [provider]  cholecalciferol (VITAMIN D) 1000 UNITS tablet Take 1,000 Units by mouth daily.    [provider]  citalopram (CELEXA) 20 MG tablet Take 1 tablet by mouth daily. 12/30/13   [provider]  dimenhyDRINATE (DRAMAMINE) 50 MG tablet Take 1 tablet by mouth as needed.    [provider]  docusate sodium (COLACE) 100 MG capsule Take 1 capsule by mouth daily.    [provider]  donepezil (ARICEPT) 10 MG tablet Take 1 tablet by mouth at bedtime. 02/02/14   [provider]  esomeprazole (NEXIUM) 40 MG capsule Take 40 mg by mouth daily.    [provider]  fluticasone (FLONASE) 50 MCG/ACT nasal spray Place 1 spray into both nostrils daily. 02/24/14   [provider]  Fluticasone-Salmeterol (ADVAIR) 100-50 MCG/DOSE AEPB Inhale 1 puff into the lungs 2 (two) times daily.    [provider]  Glucosamine-Chondroit-Vit C-Mn (GLUCOSAMINE 1500 COMPLEX PO) Take 1 capsule by mouth daily.    [provider]  ibuprofen (ADVIL,MOTRIN) 200 MG tablet Take 200-400 mg by mouth as needed.    [provider]  losartan (COZAAR) 100 MG tablet Take 1 tablet by mouth daily. 03/23/14   [provider]  meclizine (ANTIVERT) 25 MG tablet Take 1 tablet by mouth as needed.  [provider]  metoprolol succinate (TOPROL-XL) 50 MG 24 hr tablet Take 1 tablet by mouth daily. 07/19/14   [provider]  Multiple Vitamins-Minerals (I-VITE PROTECT PO) Take 1 tablet by mouth daily.    [provider]  polyethylene glycol (MIRALAX) packet Take 17 g by mouth as needed.    [provider]  QUEtiapine (SEROQUEL) 25 MG tablet Take 1 tablet by mouth at bedtime. 01/24/14   [provider]  rosuvastatin (CRESTOR) 10 MG tablet Take 1 tablet (10 mg total) by mouth daily at 6 PM. 08/24/14   Aldean Jewett, MD  traMADol (ULTRAM) 50 MG tablet Take 50 mg by mouth 2  (two) times daily.    [provider]  triamterene-hydrochlorothiazide (MAXZIDE-25) 37.5-25 MG per tablet Take 0.5 tablets by mouth daily. 06/16/14   [provider]  valACYclovir (VALTREX) 500 MG tablet Take 500 mg by mouth as needed.    [provider]    REVIEW OF SYSTEMS:  Review of Systems  Constitutional: Negative for chills, fever, malaise/fatigue and weight loss.  HENT: Negative for ear pain, hearing loss and tinnitus.   Eyes: Negative for blurred vision, double vision, pain and redness.  Respiratory: Positive for cough, shortness of breath and wheezing. Negative for hemoptysis.   Cardiovascular: Negative for chest pain, palpitations, orthopnea and leg swelling.  Gastrointestinal: Negative for abdominal pain, constipation, diarrhea, nausea and vomiting.  Genitourinary: Negative for dysuria, frequency and hematuria.  Musculoskeletal: Negative for back pain, joint pain and neck pain.  Skin:       No acne, rash, or lesions  Neurological: Negative for dizziness, tremors, focal weakness and weakness.  Endo/Heme/Allergies: Negative for polydipsia. Does not bruise/bleed easily.  Psychiatric/Behavioral: Negative for depression. The patient is not nervous/anxious and does not have insomnia.      VITAL SIGNS:   Vitals:   03/06/17 1913 03/06/17 1918 03/06/17 1921 03/06/17 2021  BP:    106/86  Pulse:  (!) 123  (!) 108  Resp:  (!) 28  (!) 22  Temp:  (!) 101.8 F (38.8 C)    TempSrc:  Rectal    SpO2: (!) 89% (!) 87%  95%  Weight:   59 kg (130 lb)    Wt Readings from Last 3 Encounters:  03/06/17 59 kg (130 lb)  08/23/14 64.9 kg (143 lb)    PHYSICAL EXAMINATION:  Physical Exam  Vitals reviewed. Constitutional: She is oriented to person, place, and time. She appears well-developed and well-nourished. No distress.  HENT:  Head: Normocephalic and atraumatic.  Mouth/Throat: Oropharynx is clear and moist.  Eyes: Conjunctivae and EOM are normal. Pupils are  equal, round, and reactive to light. No scleral icterus.  Neck: Normal range of motion. Neck supple. No JVD present. No thyromegaly present.  Cardiovascular: Regular rhythm and intact distal pulses. Exam reveals no gallop and no friction rub.  No murmur heard. Tachycardic  Respiratory: She is in respiratory distress (Mild). She has wheezes. She has no rales.  Rhonchi  GI: Soft. Bowel sounds are normal. She exhibits no distension. There is no tenderness.  Musculoskeletal: Normal range of motion. She exhibits no edema.  No arthritis, no gout  Lymphadenopathy:    She has no cervical adenopathy.  Neurological: She is alert and oriented to person, place, and time. No cranial nerve deficit.  No dysarthria, no aphasia  Skin: Skin is warm and dry. No rash noted. No erythema.  Psychiatric: She has a normal mood and affect. Her behavior is  normal. Judgment and thought content normal.    LABORATORY PANEL:   CBC Recent Labs  Lab 03/06/17 1928  WBC 17.2*  HGB 13.3  HCT 39.7  PLT 111*   ------------------------------------------------------------------------------------------------------------------  Chemistries  Recent Labs  Lab 03/06/17 1928  NA 137  K 3.8  CL 104  CO2 22  GLUCOSE 115*  BUN 35*  CREATININE 1.26*  CALCIUM 8.9  AST 37  ALT 23  ALKPHOS 105  BILITOT 0.5   ------------------------------------------------------------------------------------------------------------------  Cardiac Enzymes No results for input(s): TROPONINI in the last 168 hours. ------------------------------------------------------------------------------------------------------------------  RADIOLOGY:  Dg Chest Port 1 View  Result Date: 03/06/2017 CLINICAL DATA:  Fever weakness today.  Cough. EXAM: PORTABLE CHEST 1 VIEW COMPARISON:  07/10/2010 FINDINGS: Lungs are adequately inflated as patient is rotated to the left. There is hazy perihilar opacification likely due to mild asymmetric  interstitial edema right greater than left. Right infrahilar infection is possible. No effusion. Mild cardiomegaly. Calcified plaque over the thoracic aorta. Degenerative change of the spine. Multiple old left posterior rib fractures. IMPRESSION: Hazy bilateral perihilar opacification right greater than left likely asymmetric edema, although infection in the right infrahilar region is possible. Cardiomegaly. Electronically Signed   By: Marin Olp M.D.   On: 03/06/2017 20:18    EKG:   Orders placed or performed during the hospital encounter of 03/06/17  . EKG 12-Lead  . EKG 12-Lead  . ED EKG 12-Lead  . ED EKG 12-Lead    IMPRESSION AND PLAN:  Principal Problem:   Sepsis ([place]) -IV antibiotics given, lactic acid within normal limits, blood pressure stable, cultures sent from the ED Active Problems:   CAP (community acquired pneumonia) -IV antibiotics as above, other supportive treatment PRN   HTN (hypertension) -blood pressure is borderline low, will hold home antihypertensives for now   Anxiety -home dose anxiolytics   HLD (hyperlipidemia) -home dose anti-lipids   GERD (gastroesophageal reflux disease) -home dose PPI   CKD (chronic kidney disease), stage III ([place]) -at baseline, avoid nephrotoxins and monitor  All the records are reviewed and case discussed with ED provider. Management plans discussed with the patient and/or family.  DVT PROPHYLAXIS: SubQ heparin  GI PROPHYLAXIS: PPI  ADMISSION STATUS: Inpatient  CODE STATUS: Full Code Status History    Date Active Date Inactive Code Status Order ID Comments User Context   08/23/2014 18:01 08/24/2014 18:10 Full Code [id number]  Bettey Costa, MD ED      TOTAL TIME TAKING CARE OF THIS PATIENT: 45 minutes.   Kathrynn Backstrom [place] 03/06/2017, 8:59 PM  [employer]  [phone]  CC: Primary care physician; Leonel Ramsay, MD  Note:  This document was prepared using Dragon voice recognition software  and may include unintentional dictation errors.

## 2017-03-06 NOTE — ED Notes (Signed)
Son is at bedside, states patient was ambulatory and at baseline yesterday. Son states vomiting began around 1400 today.

## 2017-03-06 NOTE — Progress Notes (Signed)
RT called to assess pt. BS diminished with rhonchi/wheezing/fine crackles. Pt instructed to cough BS diminished with wheezing/fine crackles post cough. Unable to give Duoneb tx due to increased HR. RN aware

## 2017-03-07 ENCOUNTER — Other Ambulatory Visit: Payer: Self-pay

## 2017-03-07 DIAGNOSIS — L8990 Pressure ulcer of unspecified site, unspecified stage: Secondary | ICD-10-CM

## 2017-03-07 LAB — BASIC METABOLIC PANEL
ANION GAP: 12 (ref 5–15)
BUN: 30 mg/dL — ABNORMAL HIGH (ref 6–20)
CHLORIDE: 110 mmol/L (ref 101–111)
CO2: 17 mmol/L — AB (ref 22–32)
Calcium: 8.1 mg/dL — ABNORMAL LOW (ref 8.9–10.3)
Creatinine, Ser: 1.29 mg/dL — ABNORMAL HIGH (ref 0.44–1.00)
GFR calc non Af Amer: 35 mL/min — ABNORMAL LOW (ref >60)
GFR, EST AFRICAN AMERICAN: 41 mL/min — AB (ref >60)
GLUCOSE: 125 mg/dL — AB (ref 65–99)
POTASSIUM: 3.4 mmol/L — AB (ref 3.5–5.1)
Sodium: 139 mmol/L (ref 135–145)

## 2017-03-07 LAB — CBC
HCT: 37.4 % (ref 35.0–47.0)
HEMOGLOBIN: 12.4 g/dL (ref 12.0–16.0)
MCH: 32.7 pg (ref 26.0–34.0)
MCHC: 33.3 g/dL (ref 32.0–36.0)
MCV: 98.2 fL (ref 80.0–100.0)
Platelets: 85 10*3/uL — ABNORMAL LOW (ref 150–440)
RBC: 3.8 MIL/uL (ref 3.80–5.20)
RDW: 13.8 % (ref 11.5–14.5)
WBC: 19.8 10*3/uL — ABNORMAL HIGH (ref 3.6–11.0)

## 2017-03-07 LAB — MRSA PCR SCREENING: MRSA by PCR: NEGATIVE

## 2017-03-07 MED ORDER — ORAL CARE MOUTH RINSE
15.0000 mL | Freq: Two times a day (BID) | OROMUCOSAL | Status: DC
  Administered 2017-03-07 – 2017-03-11 (×9): 15 mL via OROMUCOSAL

## 2017-03-07 MED ORDER — ALUM & MAG HYDROXIDE-SIMETH 200-200-20 MG/5ML PO SUSP: 30.0000 mL | Freq: Four times a day (QID) | ORAL | Status: DC | PRN

## 2017-03-07 MED ORDER — GALANTAMINE HYDROBROMIDE 4 MG PO TABS
4.0000 mg | ORAL_TABLET | Freq: Two times a day (BID) | ORAL | Status: DC
  Administered 2017-03-07 – 2017-03-12 (×10): 4 mg via ORAL
  Filled 2017-03-07 (×11): qty 1

## 2017-03-07 NOTE — Progress Notes (Signed)
MD notified of heart rate and rhythm. New orders placed. Will continue to monitor.

## 2017-03-07 NOTE — Progress Notes (Signed)
[place] at [place] NAME: Rita Vang    MR#:  [id number]  DATE OF BIRTH:  26-Oct-1924  SUBJECTIVE:  CHIEF COMPLAINT:   [name] pleasantly confused with chronic history of dementia Answers very few questions  REVIEW OF SYSTEMS:   RESPIRATORY: Reporting cough, improving shortness of breath, denies wheezing or hemoptysis.  CARDIOVASCULAR: No chest pain, orthopnea, edema.  GASTROINTESTINAL: No nausea, vomiting, diarrhea or abdominal pain.  MUSCULOSKELETAL: No joint pain or arthritis.   NEUROLOGIC: No tingling, numbness, weakness.  PSYCHIATRY: No anxiety or depression.   DRUG ALLERGIES:   Allergies  Allergen Reactions  . Oxycontin [Oxycodone Hcl] Other (See Comments)    Hallucinations     VITALS:  Blood pressure 111/69, pulse 82, temperature 98.4 F (36.9 C), temperature source Oral, resp. rate (!) 22, height 5' 2"  (1.575 m), weight 59 kg (130 lb), SpO2 95 %.  PHYSICAL EXAMINATION:  GENERAL:  84 y.o.-year-old patient lying in the bed with no acute distress.  EYES: Pupils equal, round, reactive to light and accommodation. No scleral icterus. Extraocular muscles intact.  HEENT: Head atraumatic, normocephalic. Oropharynx and nasopharynx clear.  NECK:  Supple, no jugular venous distention. No thyroid enlargement, no tenderness.  LUNGS: Moderate breath sounds bilaterally, no wheezing, rales,rhonchi or crepitation. No use of accessory muscles of respiration.  CARDIOVASCULAR: S1, S2 normal. No murmurs, rubs, or gallops.  ABDOMEN: Soft, nontender, nondistended. Bowel sounds present.  EXTREMITIES: No pedal edema, cyanosis, or clubbing.  NEUROLOGIC: Patient has chronic dementia but awake and alert oriented x1-2  pSYCHIATRIC: The patient is alert and oriented x 1-2  SKIN: No obvious rash, lesion, or ulcer.    LABORATORY PANEL:   CBC Recent Labs  Lab 03/07/17 0400  WBC 19.8*  HGB 12.4  HCT 37.4  PLT 85*    ------------------------------------------------------------------------------------------------------------------  Chemistries  Recent Labs  Lab 03/06/17 1928 03/07/17 0400  NA 137 139  K 3.8 3.4*  CL 104 110  CO2 22 17*  GLUCOSE 115* 125*  BUN 35* 30*  CREATININE 1.26* 1.29*  CALCIUM 8.9 8.1*  AST 37  --   ALT 23  --   ALKPHOS 105  --   BILITOT 0.5  --    ------------------------------------------------------------------------------------------------------------------  Cardiac Enzymes No results for input(s): TROPONINI in the last 168 hours. ------------------------------------------------------------------------------------------------------------------  RADIOLOGY:  Dg Chest Port 1 View  Result Date: 03/06/2017 CLINICAL DATA:  Fever weakness today.  Cough. EXAM: PORTABLE CHEST 1 VIEW COMPARISON:  07/10/2010 FINDINGS: Lungs are adequately inflated as patient is rotated to the left. There is hazy perihilar opacification likely due to mild asymmetric interstitial edema right greater than left. Right infrahilar infection is possible. No effusion. Mild cardiomegaly. Calcified plaque over the thoracic aorta. Degenerative change of the spine. Multiple old left posterior rib fractures. IMPRESSION: Hazy bilateral perihilar opacification right greater than left likely asymmetric edema, although infection in the right infrahilar region is possible. Cardiomegaly. Electronically Signed   By: Marin Olp M.D.   On: 03/06/2017 20:18    EKG:   Orders placed or performed during the hospital encounter of 03/06/17  . EKG 12-Lead  . EKG 12-Lead  . ED EKG 12-Lead  . ED EKG 12-Lead    ASSESSMENT AND PLAN:    Sepsis ([place]) -secondary to community-acquired pneumonia  Patient met septic criteria with hypotension and tachycardia at the time of admission  IV antibiotics given, lactic acid within normal limit Follow-up pending cultures     CAP (community  acquired pneumonia) -IV  antibiotics as above, other supportive treatment PRN Solu-Medrol for bronchodilatation, bronchodilator treatment    HTN (hypertension) -blood pressure is borderline low, will hold home antihypertensives for now    Anxiety -home dose anxiolytics    Chronic history of dementia continue Aricept and galantamine    GERD (gastroesophageal reflux disease) -home dose PPI    CKD (chronic kidney disease), stage III ([place]) -at baseline, avoid nephrotoxins and monitor      All the records are reviewed and case discussed with Care Management/Social Workerr. Management plans discussed with the patient, no family members are available  CODE STATUS: fc , son Nicki Reaper is the healthcare power of attorney  TOTAL TIME TAKING CARE OF THIS PATIENT: 36 minutes.   POSSIBLE D/C IN 1-2 DAYS, DEPENDING ON CLINICAL CONDITION.  Note: This dictation was prepared with Dragon dictation along with smaller phrase technology. Any transcriptional errors that result from this process are unintentional.   Nicholes Mango M.D on 03/07/2017 at 4:02 PM  Between 7am to 6pm - Pager - [phone] After 6pm go to [email or web address] - password [email or web address] [facility]  [place] [facility]  Office  [phone]  CC: Primary care physician; Leonel Ramsay, MD

## 2017-03-08 LAB — CBC
HCT: 34.9 % — ABNORMAL LOW (ref 35.0–47.0)
Hemoglobin: 11.4 g/dL — ABNORMAL LOW (ref 12.0–16.0)
MCH: 32.1 pg (ref 26.0–34.0)
MCHC: 32.8 g/dL (ref 32.0–36.0)
MCV: 98.1 fL (ref 80.0–100.0)
PLATELETS: 78 10*3/uL — AB (ref 150–440)
RBC: 3.56 MIL/uL — AB (ref 3.80–5.20)
RDW: 13.6 % (ref 11.5–14.5)
WBC: 19.7 10*3/uL — AB (ref 3.6–11.0)

## 2017-03-08 LAB — URINE CULTURE: Culture: NO GROWTH

## 2017-03-08 LAB — BASIC METABOLIC PANEL
ANION GAP: 8 (ref 5–15)
BUN: 31 mg/dL — ABNORMAL HIGH (ref 6–20)
CALCIUM: 8.4 mg/dL — AB (ref 8.9–10.3)
CO2: 22 mmol/L (ref 22–32)
Chloride: 111 mmol/L (ref 101–111)
Creatinine, Ser: 1.08 mg/dL — ABNORMAL HIGH (ref 0.44–1.00)
GFR, EST AFRICAN AMERICAN: 51 mL/min — AB (ref >60)
GFR, EST NON AFRICAN AMERICAN: 44 mL/min — AB (ref >60)
GLUCOSE: 134 mg/dL — AB (ref 65–99)
POTASSIUM: 3.4 mmol/L — AB (ref 3.5–5.1)
SODIUM: 141 mmol/L (ref 135–145)

## 2017-03-08 MED ORDER — METOPROLOL TARTRATE 25 MG PO TABS: 12.5000 mg | ORAL_TABLET | Freq: Two times a day (BID) | ORAL | Status: DC

## 2017-03-08 MED ORDER — METHYLPREDNISOLONE SODIUM SUCC 40 MG IJ SOLR
40.0000 mg | Freq: Two times a day (BID) | INTRAMUSCULAR | Status: DC
  Administered 2017-03-09 – 2017-03-10 (×3): 40 mg via INTRAVENOUS
  Filled 2017-03-08 (×3): qty 1

## 2017-03-08 MED ORDER — POTASSIUM CHLORIDE CRYS ER 20 MEQ PO TBCR
40.0000 meq | EXTENDED_RELEASE_TABLET | Freq: Once | ORAL | Status: AC
  Administered 2017-03-08: 40 meq via ORAL
  Filled 2017-03-08: qty 2

## 2017-03-08 MED ORDER — METOPROLOL TARTRATE 25 MG PO TABS
25.0000 mg | ORAL_TABLET | Freq: Two times a day (BID) | ORAL | Status: DC
  Administered 2017-03-08 (×2): 25 mg via ORAL
  Filled 2017-03-08 (×2): qty 1

## 2017-03-08 MED ORDER — METOPROLOL TARTRATE 5 MG/5ML IV SOLN
5.0000 mg | INTRAVENOUS | Status: DC | PRN
  Administered 2017-03-12: 5 mg via INTRAVENOUS
  Filled 2017-03-08: qty 5

## 2017-03-08 NOTE — Progress Notes (Signed)
Notified by central tele pt running between Afib HR as high as 130's- and Sinus Tach. MD Gouru notified. MD placing orders.

## 2017-03-08 NOTE — Progress Notes (Addendum)
[place] at [place] NAME: Rita Vang    MR#:  [id number]  DATE OF BIRTH:  04/16/1924  SUBJECTIVE:  CHIEF COMPLAINT:   [name] pleasantly confused with chronic history of dementia Answers  few questions appropriately  REVIEW OF SYSTEMS:   RESPIRATORY: Reporting cough, improving shortness of breath, denies wheezing or hemoptysis.  CARDIOVASCULAR: No chest pain, orthopnea, edema.  GASTROINTESTINAL: No nausea, vomiting, diarrhea or abdominal pain.  MUSCULOSKELETAL: No joint pain or arthritis.   NEUROLOGIC: No tingling, numbness, weakness.  PSYCHIATRY: No anxiety or depression.   DRUG ALLERGIES:   Allergies  Allergen Reactions  . Oxycontin [Oxycodone Hcl] Other (See Comments)    Hallucinations     VITALS:  Blood pressure 131/67, pulse 67, temperature 98.4 F (36.9 C), temperature source Oral, resp. rate (!) 23, height 5' 2"  (1.575 m), weight 59 kg (130 lb), SpO2 98 %.  PHYSICAL EXAMINATION:  GENERAL:  84 y.o.-year-old patient lying in the bed with no acute distress.  EYES: Pupils equal, round, reactive to light and accommodation. No scleral icterus. Extraocular muscles intact.  HEENT: Head atraumatic, normocephalic. Oropharynx and nasopharynx clear.  NECK:  Supple, no jugular venous distention. No thyroid enlargement, no tenderness.  LUNGS: Moderate breath sounds bilaterally, no wheezing, rales,rhonchi or crepitation. No use of accessory muscles of respiration.  CARDIOVASCULAR: S1, S2 normal. No murmurs, rubs, or gallops.  ABDOMEN: Soft, nontender, nondistended. Bowel sounds present.  EXTREMITIES: No pedal edema, cyanosis, or clubbing.  NEUROLOGIC: Patient has chronic dementia but awake and alert oriented x1-2  pSYCHIATRIC: The patient is alert and oriented x 1-2  SKIN: No obvious rash, lesion, or ulcer.    LABORATORY PANEL:   CBC Recent Labs  Lab 03/08/17 0412  WBC 19.7*  HGB 11.4*  HCT 34.9*  PLT 78*    ------------------------------------------------------------------------------------------------------------------  Chemistries  Recent Labs  Lab 03/06/17 1928  03/08/17 0412  NA 137   < > 141  K 3.8   < > 3.4*  CL 104   < > 111  CO2 22   < > 22  GLUCOSE 115*   < > 134*  BUN 35*   < > 31*  CREATININE 1.26*   < > 1.08*  CALCIUM 8.9   < > 8.4*  AST 37  --   --   ALT 23  --   --   ALKPHOS 105  --   --   BILITOT 0.5  --   --    < > = values in this interval not displayed.   ------------------------------------------------------------------------------------------------------------------  Cardiac Enzymes No results for input(s): TROPONINI in the last 168 hours. ------------------------------------------------------------------------------------------------------------------  RADIOLOGY:  Dg Chest Port 1 View  Result Date: 03/06/2017 CLINICAL DATA:  Fever weakness today.  Cough. EXAM: PORTABLE CHEST 1 VIEW COMPARISON:  07/10/2010 FINDINGS: Lungs are adequately inflated as patient is rotated to the left. There is hazy perihilar opacification likely due to mild asymmetric interstitial edema right greater than left. Right infrahilar infection is possible. No effusion. Mild cardiomegaly. Calcified plaque over the thoracic aorta. Degenerative change of the spine. Multiple old left posterior rib fractures. IMPRESSION: Hazy bilateral perihilar opacification right greater than left likely asymmetric edema, although infection in the right infrahilar region is possible. Cardiomegaly. Electronically Signed   By: Marin Olp M.D.   On: 03/06/2017 20:18    EKG:   Orders placed or performed during the hospital encounter of 03/06/17  . EKG 12-Lead  . EKG 12-Lead  .  ED EKG 12-Lead  . ED EKG 12-Lead    ASSESSMENT AND PLAN:    Sepsis ([place]) -secondary to community-acquired pneumonia  Patient met septic criteria with hypotension and tachycardia at the time of admission  IV antibiotics  given, lactic acid within normal limit  blood cultures and urine cultures are negative.  MRSA PCR is negative    CAP (community acquired pneumonia) -IV antibiotics cefepime , other supportive treatment PRN Solu-Medrol for bronchodilatation, bronchodilator treatment Vancomycin discontinued  Atrial fibrillation with RVR-from underlying sepsis IV Lopressor as needed Started patient on a small dose of metoprolol which is her home medication    HTN (hypertension) -blood pressure is better Resume home medication metoprolol succinate at low-dose 25 mg    Anxiety -home dose anxiolytics    Chronic history of dementia continue Aricept and galantamine    GERD (gastroesophageal reflux disease) -home dose PPI    CKD (chronic kidney disease), stage III ([place]) -at baseline, avoid nephrotoxins and monitor   Generalized weakness PT evaluation   All the records are reviewed and case discussed with Care Management/Social Workerr. Management plans discussed with the patient, no family members are available  CODE STATUS: fc , son Nicki Reaper is the healthcare power of attorney  TOTAL TIME TAKING CARE OF THIS PATIENT: 36 minutes.   POSSIBLE D/C IN 1-2 DAYS, DEPENDING ON CLINICAL CONDITION.  Note: This dictation was prepared with Dragon dictation along with smaller phrase technology. Any transcriptional errors that result from this process are unintentional.   Nicholes Mango M.D on 03/08/2017 at 2:11 PM  Between 7am to 6pm - Pager - [phone] After 6pm go to [email or web address] - password [email or web address] [facility]  [place] [facility]  Office  [phone]  CC: Primary care physician; Leonel Ramsay, MD

## 2017-03-08 NOTE — Progress Notes (Signed)
PT Cancellation Note  Patient Details Name: Rita SnareFrances L Vang MRN: [id number] DOB: 11/27/1924   Cancelled Treatment:    Reason Eval/Treat Not Completed: Patient not medically ready. Patient has been sinus tachy, HRs in the 130s at rest in bed. PT will hold mobility evaluation until patient is more appropriate and stable.    Alva GarnetPatrick McNamara PT, DPT, CSCS    03/08/2017, 2:43 PM

## 2017-03-09 ENCOUNTER — Inpatient Hospital Stay: Payer: Medicare Other

## 2017-03-09 MED ORDER — PIPERACILLIN-TAZOBACTAM 3.375 G IVPB: 3.3750 g | Freq: Three times a day (TID) | INTRAVENOUS | Status: DC

## 2017-03-09 MED ORDER — METOPROLOL SUCCINATE ER 50 MG PO TB24
50.0000 mg | ORAL_TABLET | Freq: Every day | ORAL | Status: DC
  Administered 2017-03-09 – 2017-03-12 (×4): 50 mg via ORAL
  Filled 2017-03-09 (×4): qty 1

## 2017-03-09 NOTE — NC FL2 (Addendum)
[place] MEDICAID FL2 LEVEL OF CARE SCREENING TOOL     IDENTIFICATION  Patient Name: Rita Vang Birthdate: 04-Jan-1925 Sex: female Admission Date (Current Location): 03/06/2017  [place] and [place] Number:  Chiropodist and Address:  [facility], [address], [place], [place] [address]      Provider Number: [id number]  Attending Physician Name and Address:  Ramonita Lab, MD  Relative Name and Phone Number:       Current Level of Care: Hospital Recommended Level of Care: Skilled Nursing Facility Prior Approval Number:    Date Approved/Denied:   PASRR Number: [id number]  Discharge Plan: SNF    Current Diagnoses: Patient Active Problem List   Diagnosis Date Noted  . Pressure injury of skin 03/07/2017  . Sepsis (HCC) 03/06/2017  . CAP (community acquired pneumonia) 03/06/2017  . HTN (hypertension) 03/06/2017  . Anxiety 03/06/2017  . HLD (hyperlipidemia) 03/06/2017  . GERD (gastroesophageal reflux disease) 03/06/2017  . CKD (chronic kidney disease), stage III (HCC) 03/06/2017  . CVA (cerebral infarction) 08/23/2014    Orientation RESPIRATION BLADDER Height & Weight     Self  O2(2 liters) Incontinent Weight: 130 lb (59 kg) Height:  5\' 2"  (157.5 cm)  BEHAVIORAL SYMPTOMS/MOOD NEUROLOGICAL BOWEL NUTRITION STATUS  (none) (none) Incontinent Diet(heart healthy)  AMBULATORY STATUS COMMUNICATION OF NEEDS Skin   Limited Assist Verbally PU Stage and Appropriate Care                       Personal Care Assistance Level of Assistance  Bathing, Feeding, Dressing Bathing Assistance: Maximum assistance Feeding assistance: Maximum assistance Dressing Assistance: Maximum assistance     Functional Limitations Info  Hearing   Hearing Info: Impaired      SPECIAL CARE FACTORS FREQUENCY  PT (By licensed PT)                    Contractures Contractures Info: Not present    Additional Factors Info  Code Status Code  Status Info: full             Current Medications (03/09/2017):  This is the current hospital active medication list Current Facility-Administered Medications  Medication Dose Route Frequency Provider Last Rate Last Dose  . acetaminophen (TYLENOL) tablet 650 mg  650 mg Oral Q6H PRN Oralia Manis, MD       Or  . acetaminophen (TYLENOL) suppository 650 mg  650 mg Rectal Q6H PRN Oralia Manis, MD   650 mg at 03/06/17 2154  . alum & mag hydroxide-simeth (MAALOX/MYLANTA) 200-200-20 MG/5ML suspension 30 mL  30 mL Oral Q6H PRN Ailani Governale, MD      . aspirin tablet 325 mg  325 mg Oral Daily Oralia Manis, MD   325 mg at 03/09/17 [phone]  . ceFEPIme (MAXIPIME) 2 g in sodium chloride 0.9 % 100 mL IVPB  2 g Intravenous Q24H Myrna Blazer, MD   Stopped at 03/08/17 1751  . citalopram (CELEXA) tablet 20 mg  20 mg Oral Daily Oralia Manis, MD   20 mg at 03/09/17 [phone]  . donepezil (ARICEPT) tablet 10 mg  10 mg Oral Lamont Snowball, MD   10 mg at 03/08/17 2114  . galantamine (RAZADYNE) tablet 4 mg  4 mg Oral BID WC Stephaun Million, MD   4 mg at 03/09/17 0835  . guaiFENesin-dextromethorphan (ROBITUSSIN DM) 100-10 MG/5ML syrup 5 mL  5 mL Oral Q4H PRN Oralia Manis, MD      .  heparin injection 5,000 Units  5,000 Units Subcutaneous [place] OraQ8H Willis, David, MD   5,000 Units at 03/09/17 0654  . ipratropium (ATROVENT) nebulizer solution 0.5 mg  0.5 mg Nebulization Q4H Oralia ManisWillis, David, MD   0.5 mg at 03/09/17 1108  . levalbuterol (XOPENEX) nebulizer solution 1.25 mg  1.25 mg Nebulization Q4H Oralia ManisWillis, David, MD   1.25 mg at 03/09/17 1108  . MEDLINE mouth rinse  15 mL Mouth Rinse BID Dandrae Kustra, MD   15 mL at 03/09/17 0840  . methylPREDNISolone sodium succinate (SOLU-MEDROL) 40 mg/mL injection 40 mg  40 mg Intravenous Q12H Vearl Allbaugh, MD   40 mg at 03/09/17 0253  . metoprolol succinate (TOPROL-XL) 24 hr tablet 50 mg  50 mg Oral Daily Adella Manolis, MD   50 mg at 03/09/17 0835  . metoprolol tartrate (LOPRESSOR)  injection 5 mg  5 mg Intravenous Q4H PRN Montez Stryker, MD      . mometasone-formoterol (DULERA) 100-5 MCG/ACT inhaler 2 puff  2 puff Inhalation BID Oralia ManisWillis, David, MD   2 puff at 03/09/17 [phone]  . ondansetron (ZOFRAN) tablet 4 mg  4 mg Oral Q6H PRN Oralia ManisWillis, David, MD       Or  . ondansetron [facility](ZOFRAN) injection 4 mg  4 mg Intravenous Q6H PRN Oralia ManisWillis, David, MD      . pantoprazole (PROTONIX) EC tablet 40 mg  40 mg Oral Daily Oralia ManisWillis, David, MD   40 mg at 03/09/17 [phone]     Discharge Medications: Please see discharge summary for a list of discharge medications.  Relevant Imaging Results:  Relevant Lab Results:   Additional Information    York SpanielMonica Marra, LCSW

## 2017-03-09 NOTE — Progress Notes (Signed)
[place] at [place] NAME: Rita Vang    MR#:  [id number]  DATE OF BIRTH:  12-14-1924  SUBJECTIVE:  CHIEF COMPLAINT:   [name] very sleepy today with chronic history of dementia Son at bed side  REVIEW OF SYSTEMS:   RESPIRATORY: Reporting cough, denies wheezing or hemoptysis.  NEUROLOGIC: Very lethargic, arousable  DRUG ALLERGIES:   Allergies  Allergen Reactions  . Oxycontin [Oxycodone Hcl] Other (See Comments)    Hallucinations     VITALS:  Blood pressure (!) 145/92, pulse 100, temperature 97.7 F (36.5 C), temperature source Oral, resp. rate 20, height 5' 2"  (1.575 m), weight 59 kg (130 lb), SpO2 94 %.  PHYSICAL EXAMINATION:  GENERAL:  84 y.o.-year-old patient lying in the bed with no acute distress.  EYES: Pupils equal, round, reactive to light and accommodation. No scleral icterus. Extraocular muscles intact.  HEENT: Head atraumatic, normocephalic. Oropharynx and nasopharynx clear.  NECK:  Supple, no jugular venous distention. No thyroid enlargement, no tenderness.  LUNGS: Moderate breath sounds bilaterally, no wheezing, rales,rhonchi or crepitation. No use of accessory muscles of respiration.  CARDIOVASCULAR: S1, S2 normal. No murmurs, rubs, or gallops.  ABDOMEN: Soft, nontender, nondistended. Bowel sounds present.  EXTREMITIES: No pedal edema, cyanosis, or clubbing.  NEUROLOGIC: Patient has chronic dementia but awake and alert oriented x1-2  pSYCHIATRIC: The patient is alert and oriented x 1-2  SKIN: No obvious rash, lesion, or ulcer.    LABORATORY PANEL:   CBC Recent Labs  Lab 03/08/17 0412  WBC 19.7*  HGB 11.4*  HCT 34.9*  PLT 78*   ------------------------------------------------------------------------------------------------------------------  Chemistries  Recent Labs  Lab 03/06/17 1928  03/08/17 0412  NA 137   < > 141  K 3.8   < > 3.4*  CL 104   < > 111  CO2 22   < > 22  GLUCOSE 115*    < > 134*  BUN 35*   < > 31*  CREATININE 1.26*   < > 1.08*  CALCIUM 8.9   < > 8.4*  AST 37  --   --   ALT 23  --   --   ALKPHOS 105  --   --   BILITOT 0.5  --   --    < > = values in this interval not displayed.   ------------------------------------------------------------------------------------------------------------------  Cardiac Enzymes No results for input(s): TROPONINI in the last 168 hours. ------------------------------------------------------------------------------------------------------------------  RADIOLOGY:  No results found.  EKG:   Orders placed or performed during the hospital encounter of 03/06/17  . EKG 12-Lead  . EKG 12-Lead  . ED EKG 12-Lead  . ED EKG 12-Lead    ASSESSMENT AND PLAN:    Sepsis ([place]) -secondary to community-acquired pneumonia  Patient met septic criteria with hypotension and tachycardia at the time of admission  IV antibiotics given, lactic acid within normal limit  blood cultures and urine cultures are negative.  MRSA PCR is negative    CAP (community acquired pneumonia) -IV antibiotics cefepime , other supportive treatment PRN Solu-Medrol for bronchodilatation, bronchodilator treatment Vancomycin discontinued as MRSA PCR is negative [name] very lethargic today we will get repeat chest x-ray Hold Seroquel  Atrial fibrillation with RVR-from underlying sepsis IV Lopressor as needed Started patient on a small dose of metoprolol which is her home medication    HTN (hypertension) -blood pressure is better Resume home medication metoprolol succinate 50  mg    Anxiety -home dose anxiolytics  Chronic history of dementia continue Aricept and galantamine    GERD (gastroesophageal reflux disease) -home dose PPI    CKD (chronic kidney disease), stage III (HCC) -at baseline, avoid nephrotoxins and monitor   Generalized weakness PT evaluation recommending skilled nursing facility.  Son is agreeable.  Consult social worker  regarding placement-   All the records are reviewed and case discussed with Care Management/Social Workerr. Management plans discussed with the patient's son at bed side  CODE STATUS: fc , son Nicki Reaper is the healthcare power of attorney  TOTAL TIME TAKING CARE OF THIS PATIENT: 36 minutes.   POSSIBLE D/C IN 1-2 DAYS, DEPENDING ON CLINICAL CONDITION.  Note: This dictation was prepared with Dragon dictation along with smaller phrase technology. Any transcriptional errors that result from this process are unintentional.   Nicholes Mango M.D on 03/09/2017 at 12:01 PM  Between 7am to 6pm - Pager - [phone] After 6pm go to [email or web address] - password [email or web address] [facility]  [place] [facility]  Office  [phone]  CC: Primary care physician; Leonel Ramsay, MD

## 2017-03-09 NOTE — Clinical Social Work Note (Signed)
Clinical Social Work Assessment  Patient Details  Name: Rita Vang MRN: [id number] Date of Birth: 1924/12/11  Date of referral:  03/09/17               Reason for consult:  Discharge Planning                Permission sought to share information with:    Permission granted to share information::     Name::        Agency::     Relationship::     Contact Information:     Housing/Transportation Living arrangements for the past 2 months:  Single Family Home Source of Information:  Adult Children Patient Interpreter Needed:  None Criminal Activity/Legal Involvement Pertinent to Current Situation/Hospitalization:  No - Comment as needed Significant Relationships:  Adult Children Lives with:  Self Do you feel safe going back to the place where you live?    Need for family participation in patient care:  Yes (Comment)  Care giving concerns:  Patient resides at home with caregivers.   Social Worker assessment / plan:  CSW aware that PT recommends STR. [name] lethargic today and participation was minimal. CSW met with patient's son at bedside: Ailany Koren: [phone]. CSW explained role and purpose of visit. Patient's son is in agreement for rehab and states she has been to [facility] in the past. He stated that he does not want her discharging today because her mental status has changed once again from yesterday. He gave CSW permission to conduct bedsearch.   Employment status:  Retired Forensic scientist:  Medicare PT Recommendations:    Information / Referral to community resources:     Patient/Family's Response to care:  Patient's son expressed appreciation for CSW assistance.  Patient/Family's Understanding of and Emotional Response to Diagnosis, Current Treatment, and Prognosis:  Patient's son involved in his mother's treatment plan.  Emotional Assessment Appearance:  Appears stated age Attitude/Demeanor/Rapport:  (patient sleeping soundly) Affect (typically  observed):  Calm Orientation:  Fluctuating Orientation (Suspected and/or reported Sundowners) Alcohol / Substance use:  Not Applicable Psych involvement (Current and /or in the community):  No (Comment)  Discharge Needs  Concerns to be addressed:  Care Coordination Readmission within the last 30 days:  No Current discharge risk:  None Barriers to Discharge:  No Barriers Identified   Shela Leff, LCSW 03/09/2017, 1:20 PM

## 2017-03-09 NOTE — Evaluation (Signed)
Physical Therapy Evaluation Patient Details Name: Rita Vang MRN: [id number] DOB: [birth year] Today's Date: 03/09/2017   History of Present Illness  Pt admitted for sepsis, hypoxia and multifocal pneumonia.  PMH includes Htn, HLD, GERD, depression, dementia, R CVA and anxiety.  Clinical Impression  Pt is a 84 year old female who lives in a house alone and receives assistance from family and personal care attendants daily.  She is in bed and appears lethargic upon PT arrival.  Pt's son is present to give social hx and assist with bed mobility.  During bed mobility, pt attempts to assist in supine to sit and sit to supine but repeatedly falls asleep.  Pt performed sitting balance at EOB with feet on floor and max A to avoid leaning to L side.  She presents with decreased UE and LE strength.  Pt's son reports that he uses RW and gait belt for transfers at home and that he takes pt out on "field trips" weekly.  Pt will continue to benefit from skilled PT with focus on strength, tolerance to activity, functional mobility and safe use of AD.  Discharge recommendation is SNF until pt is more alert to demonstrate baseline performance.    Follow Up Recommendations SNF    Equipment Recommendations       Recommendations for Other Services       Precautions / Restrictions Precautions Precautions: Fall Restrictions Weight Bearing Restrictions: No      Mobility  Bed Mobility Overal bed mobility: Needs Assistance Bed Mobility: Supine to Sit;Sit to Supine     Supine to sit: Total assist;HOB elevated Sit to supine: Total assist;HOB elevated   General bed mobility comments: Pt total assist for all bed mobility including scooting up in bed, which pt's son helped with.  Pt attempted to follow commands but would fall back to sleep when transfer was initiated.  Transfers Overall transfer level: (Unable to perform.)                  Ambulation/Gait Ambulation/Gait assistance: (Unable  to perform.)              Stairs            Wheelchair Mobility    Modified Rankin (Stroke Patients Only)       Balance Overall balance assessment: Needs assistance     Sitting balance - Comments: Pt leans to L when sitting at EOB and needed Max A to sit upright.  She appeared to be in pain or agitated at times when sitting at EOB.                                     Pertinent Vitals/Pain Pain Assessment: No/denies pain    Home Living Family/patient expects to be discharged to:: Private residence(Social Hx obtained from son) Living Arrangements: Alone(Son lives 8 min from her home.) Available Help at Discharge: Family;Personal care attendant Type of Home: House Home Access: Ramped entrance     Home Layout: One level Home Equipment: Wheelchair - manual;Hand held shower head;Toilet riser;Shower seat;Bedside commode      Prior Function Level of Independence: Needs assistance      ADL's / Homemaking Assistance Needed: Sitters or son come in to assist pt with bathing and dressing        Hand Dominance        Extremity/Trunk Assessment   Upper Extremity Assessment Upper Extremity Assessment:  Generalized weakness    Lower Extremity Assessment Lower Extremity Assessment: Generalized weakness    Cervical / Trunk Assessment Cervical / Trunk Assessment: Kyphotic  Communication      Cognition Arousal/Alertness: Lethargic Behavior During Therapy: Restless Overall Cognitive Status: History of cognitive impairments - at baseline                                 General Comments: Pt very lethargic and would only wake to give short responses.        General Comments      Exercises     Assessment/Plan    PT Assessment Patient needs continued PT services  PT Problem List Decreased strength;Decreased mobility;Decreased coordination;Decreased activity tolerance;Decreased balance;Decreased knowledge of use of DME;Decreased  cognition       PT Treatment Interventions DME instruction;Therapeutic activities;Gait training;Patient/family education;Therapeutic exercise;Stair training;Balance training;Functional mobility training;Wheelchair mobility training    PT Goals (Current goals can be found in the Care Plan section)  Acute Rehab PT Goals Patient Stated Goal: To return home as soon as possible. PT Goal Formulation: With patient/family Time For Goal Achievement: 03/23/17 Potential to Achieve Goals: Good    Frequency Min 2X/week   Barriers to discharge        Co-evaluation               AM-PAC PT "6 Clicks" Daily Activity  Outcome Measure Difficulty turning over in bed (including adjusting bedclothes, sheets and blankets)?: Unable Difficulty moving from lying on back to sitting on the side of the bed? : Unable Difficulty sitting down on and standing up from a chair with arms (e.g., wheelchair, bedside commode, etc,.)?: Unable Help needed moving to and from a bed to chair (including a wheelchair)?: Total Help needed walking in hospital room?: Total Help needed climbing 3-5 steps with a railing? : Total 6 Click Score: 6    End of Session Equipment Utilized During Treatment: Gait belt;Oxygen Activity Tolerance: Patient limited by lethargy Patient left: in bed;with call bell/phone within reach;with family/visitor present;with nursing/sitter in room;with bed alarm set Nurse Communication: Mobility status PT Visit Diagnosis: History of falling (Z91.81);Muscle weakness (generalized) (M62.81)    Time: [id number] PT Time Calculation (min) (ACUTE ONLY): 25 min   Charges:   PT Evaluation $PT Eval Moderate Complexity: 1 Mod PT Treatments $Therapeutic Activity: 8-22 mins   PT G Codes:   PT G-Codes **NOT FOR INPATIENT CLASS** Functional Assessment Tool Used: AM-PAC 6 Clicks Basic Mobility    Glenetta HewSarah Yarethzy Croak, PT, DPT   Glenetta HewSarah Romen Yutzy 03/09/2017, 11:26 AM

## 2017-03-10 ENCOUNTER — Inpatient Hospital Stay
Admit: 2017-03-10 | Discharge: 2017-03-10 | Disposition: A | Discharge status: Home / Self Care | Payer: Medicare Other | Attending: Internal Medicine | Admitting: Internal Medicine

## 2017-03-10 LAB — CBC
HEMATOCRIT: 35 % (ref 35.0–47.0)
HEMOGLOBIN: 11.5 g/dL — AB (ref 12.0–16.0)
MCH: 31.9 pg (ref 26.0–34.0)
MCHC: 32.9 g/dL (ref 32.0–36.0)
MCV: 97.1 fL (ref 80.0–100.0)
Platelets: 84 10*3/uL — ABNORMAL LOW (ref 150–440)
RBC: 3.6 MIL/uL — AB (ref 3.80–5.20)
RDW: 13.4 % (ref 11.5–14.5)
WBC: 17.8 10*3/uL — ABNORMAL HIGH (ref 3.6–11.0)

## 2017-03-10 LAB — BASIC METABOLIC PANEL
Anion gap: 8 (ref 5–15)
BUN: 44 mg/dL — AB (ref 6–20)
CHLORIDE: 107 mmol/L (ref 101–111)
CO2: 21 mmol/L — AB (ref 22–32)
CREATININE: 1.1 mg/dL — AB (ref 0.44–1.00)
Calcium: 8.4 mg/dL — ABNORMAL LOW (ref 8.9–10.3)
GFR calc non Af Amer: 43 mL/min — ABNORMAL LOW (ref >60)
GFR, EST AFRICAN AMERICAN: 50 mL/min — AB (ref >60)
Glucose, Bld: 120 mg/dL — ABNORMAL HIGH (ref 65–99)
POTASSIUM: 3.8 mmol/L (ref 3.5–5.1)
Sodium: 136 mmol/L (ref 135–145)

## 2017-03-10 LAB — TROPONIN I
TROPONIN I: 0.09 ng/mL — AB (ref <0.03)
Troponin I: 0.1 ng/mL (ref <0.03)

## 2017-03-10 LAB — PROCALCITONIN: PROCALCITONIN: 2.63 ng/mL

## 2017-03-10 MED ORDER — NITROGLYCERIN 0.4 MG SL SUBL: 0.4000 mg | SUBLINGUAL_TABLET | SUBLINGUAL | Status: DC | PRN

## 2017-03-10 MED ORDER — VALACYCLOVIR HCL 500 MG PO TABS
500.0000 mg | ORAL_TABLET | Freq: Two times a day (BID) | ORAL | Status: DC
  Administered 2017-03-10 – 2017-03-12 (×4): 500 mg via ORAL
  Filled 2017-03-10 (×5): qty 1

## 2017-03-10 MED ORDER — FUROSEMIDE 20 MG PO TABS
20.0000 mg | ORAL_TABLET | Freq: Every day | ORAL | Status: DC
  Administered 2017-03-10 – 2017-03-12 (×3): 20 mg via ORAL
  Filled 2017-03-10 (×3): qty 1

## 2017-03-10 MED ORDER — METHYLPREDNISOLONE SODIUM SUCC 40 MG IJ SOLR
40.0000 mg | Freq: Three times a day (TID) | INTRAMUSCULAR | Status: DC
  Administered 2017-03-10 – 2017-03-12 (×6): 40 mg via INTRAVENOUS
  Filled 2017-03-10 (×6): qty 1

## 2017-03-10 MED ORDER — LEVALBUTEROL HCL 1.25 MG/0.5ML IN NEBU
1.2500 mg | INHALATION_SOLUTION | Freq: Four times a day (QID) | RESPIRATORY_TRACT | Status: DC
  Administered 2017-03-10 – 2017-03-11 (×6): 1.25 mg via RESPIRATORY_TRACT
  Filled 2017-03-10 (×6): qty 0.5

## 2017-03-10 MED ORDER — AZITHROMYCIN 250 MG PO TABS
250.0000 mg | ORAL_TABLET | Freq: Every day | ORAL | Status: DC
  Administered 2017-03-11 – 2017-03-12 (×2): 250 mg via ORAL
  Filled 2017-03-10 (×2): qty 1

## 2017-03-10 MED ORDER — IPRATROPIUM BROMIDE 0.02 % IN SOLN
0.5000 mg | Freq: Four times a day (QID) | RESPIRATORY_TRACT | Status: DC
  Administered 2017-03-10 – 2017-03-11 (×6): 0.5 mg via RESPIRATORY_TRACT
  Filled 2017-03-10 (×6): qty 2.5

## 2017-03-10 MED ORDER — AZITHROMYCIN 500 MG PO TABS
500.0000 mg | ORAL_TABLET | Freq: Every day | ORAL | Status: AC
  Administered 2017-03-10: 500 mg via ORAL
  Filled 2017-03-10: qty 1

## 2017-03-10 MED ORDER — LEVALBUTEROL HCL 1.25 MG/0.5ML IN NEBU: 1.2500 mg | INHALATION_SOLUTION | RESPIRATORY_TRACT | Status: DC | PRN

## 2017-03-10 NOTE — Progress Notes (Signed)
*  PRELIMINARY RESULTS* Echocardiogram 2D Echocardiogram has been performed.  [name] 03/10/2017, 3:32 PM

## 2017-03-10 NOTE — Progress Notes (Signed)
Patient coughing more frequently. Nurse administered cough medicine per PRN order as well as Tylenol administered per PRN order for patient appeared very tense and grimacing and when asked patient seemed unsure if in pain which may be due to history of Dementia. Patient took Tylenol with yogurt and cough med and tolerated it well. Will continue to monitor patient to end of shift.

## 2017-03-10 NOTE — Clinical Social Work Placement (Signed)
LCSW met with son at bedside. Bed available at [facility] place once medically stable. Discussed with son his concerns and questions during this time.  He reports he would like an EMS for transport.   [facility]  CLINICAL SOCIAL WORK PLACEMENT  NOTE  Date:  03/10/2017  Patient Details  Name: YAMARIS CUMMINGS MRN: [id number] Date of Birth: April 29, 1924  Clinical Social Work is seeking post-discharge placement for this patient at the [place] level of care (*CSW will initial, date and re-position this form in  chart as items are completed):  Yes   Patient/family provided with [facility] Work Department's list of facilities offering this level of care within the geographic area requested by the patient (or if unable, by the patient's family).  Yes   Patient/family informed of their freedom to choose among providers that offer the needed level of care, that participate in Medicare, Medicaid or managed care program needed by the patient, have an available bed and are willing to accept the patient.  Yes   Patient/family informed of [facility]'s ownership interest in [facility] and [facility], as well as of the fact that they are under no obligation to receive care at these facilities.  PASRR submitted to EDS on       PASRR number received on       Existing PASRR number confirmed on 03/10/17     FL2 transmitted to all facilities in geographic area requested by pt/family on 03/10/17     FL2 transmitted to all facilities within larger geographic area on       Patient informed that his/her managed care company has contracts with or will negotiate with certain facilities, including the following:        Yes   Patient/family informed of bed offers received.  Patient chooses bed at [facility]     Physician recommends and patient chooses bed at [facility]    Patient to be transferred to [facility] on  .  Patient to be transferred to facility by        Patient family notified on   of transfer.  Name of family member notified:        PHYSICIAN Please sign FL2     Additional Comment:    _______________________________________________ Lilly Cove, LCSW 03/10/2017, 11:53 AM

## 2017-03-10 NOTE — Progress Notes (Signed)
[facility] [place] NAME: Rita Vang    MR#:  [id number]  DATE OF BIRTH:  25-Jan-1924  SUBJECTIVE:  CHIEF COMPLAINT:   [name] wide awake and alert and resting comfortably.  Denies any chest pain during my examination but according to the son patient had chest pain today morning  REVIEW OF SYSTEMS:  Limited review of system  RESPIRATORY: Reporting cough, improving shortness of breath, denies wheezing or hemoptysis.  CARDIOVASCULAR: No chest pain, orthopnea, edema.  GASTROINTESTINAL: No nausea, vomiting, diarrhea or abdominal pain.  MUSCULOSKELETAL: No joint pain or arthritis.   NEUROLOGIC: No tingling, numbness, weakness.  PSYCHIATRY: No anxiety or depression.      DRUG ALLERGIES:   Allergies  Allergen Reactions  . Oxycontin [Oxycodone Hcl] Other (See Comments)    Hallucinations     VITALS:  Blood pressure (!) 149/91, pulse (!) 109, temperature 98.3 F (36.8 C), temperature source Oral, resp. rate (!) 21, height _0  (1.575 m), weight 59 kg (130 lb), SpO2 96 %.  PHYSICAL EXAMINATION:  GENERAL:  84 y.o.-year-old patient lying in the bed with no acute distress.  EYES: Pupils equal, round, reactive to light and accommodation. No scleral icterus. Extraocular muscles intact.  HEENT: Head atraumatic, normocephalic. Oropharynx and nasopharynx clear.  NECK:  Supple, no jugular venous distention. No thyroid enlargement, no tenderness.  LUNGS: Moderate coarse bronchial breath sounds bilaterally, no wheezing, rales,rhonchi or crepitation. No use of accessory muscles of respiration.  CARDIOVASCULAR: S1, S2 normal. No murmurs, rubs, or gallops.  ABDOMEN: Soft, nontender, nondistended. Bowel sounds present.  EXTREMITIES: No pedal edema, cyanosis, or clubbing.  NEUROLOGIC: Patient has chronic dementia but awake and alert oriented x1-2  pSYCHIATRIC: The patient is alert and oriented x 1-2  SKIN: No obvious rash, lesion, or  ulcer.    LABORATORY PANEL:   CBC Recent Labs  Lab 03/10/17 0356  WBC 17.8*  HGB 11.5*  HCT 35.0  PLT 84*   ------------------------------------------------------------------------------------------------------------------  Chemistries  Recent Labs  Lab 03/06/17 1928  03/10/17 0356  NA 137   < > 136  K 3.8   < > 3.8  CL 104   < > 107  CO2 22   < > 21*  GLUCOSE 115*   < > 120*  BUN 35*   < > 44*  CREATININE 1.26*   < > 1.10*  CALCIUM 8.9   < > 8.4*  AST 37  --   --   ALT 23  --   --   ALKPHOS 105  --   --   BILITOT 0.5  --   --    < > = values in this interval not displayed.   ------------------------------------------------------------------------------------------------------------------  Cardiac Enzymes Recent Labs  Lab 03/10/17 1116  TROPONINI 0.10*   ------------------------------------------------------------------------------------------------------------------  RADIOLOGY:  Dg Chest Port 1 View  Result Date: 03/09/2017 CLINICAL DATA:  Cough and shortness of breath. EXAM: PORTABLE CHEST 1 VIEW COMPARISON:  March 06, 2017 FINDINGS: The heart, hila, and mediastinum are unchanged. Mild pulmonary venous congestion. Patchy infiltrate developing in the right base. Healed rib fractures on the left. No other acute abnormalities. IMPRESSION: 1. Patchy infiltrate developing in the right base. Mild pulmonary venous congestion. Recommend follow-up to resolution. Electronically Signed   By: Dorise Bullion III M.D   On: 03/09/2017 12:41    EKG:   Orders placed or performed during the hospital encounter of 03/06/17  . EKG 12-Lead  . EKG 12-Lead  .  ED EKG 12-Lead  . ED EKG 12-Lead  . EKG 12-Lead  . EKG 12-Lead  . EKG 12-Lead  . EKG 12-Lead    ASSESSMENT AND PLAN:   Chest pain with elevated troponin Monitor patient on telemetry Twelve-lead EKG with no acute ST-T wave changes but in A. Fib Continue aspirin, beta-blocker metoprolol and statin Continue  oxygen via nasal cannula Cardiology consulted Echocardiogram Cycle cardiac biomarkers   Sepsis ([place]) -secondary to community-acquired pneumonia  Patient met septic criteria with hypotension and tachycardia at the time of admission  IV antibiotics given, lactic acid within normal limit  blood cultures and urine cultures are negative.  MRSA PCR is negative    CAP (community acquired pneumonia) -IV antibiotics cefepime , other supportive treatment PRN Solu-Medrol for bronchodilatation, bronchodilator treatment Vancomycin discontinued as MRSA PCR is negative  repeat chest x-ray-pneumonia is not getting worse Hold Seroquel  Atrial fibrillation with RVR-from underlying sepsis IV Lopressor as needed On metoprolol and aspirin    HTN (hypertension) -blood pressure is better Resume home medication metoprolol succinate 50  mg    Anxiety -home dose anxiolytics    Chronic history of dementia continue Aricept and galantamine    GERD (gastroesophageal reflux disease) -home dose PPI    CKD (chronic kidney disease), stage III ([place]) -at baseline, avoid nephrotoxins and monitor   Generalized weakness PT evaluation recommending skilled nursing facility.  Son is agreeable.  Consult social worker regarding placement-   All the records are reviewed and case discussed with Care Management/Social Workerr. Management plans discussed with the patient's son at bed side  CODE STATUS: fc , son Nicki Reaper is the healthcare power of attorney  TOTAL TIME TAKING CARE OF THIS PATIENT: 36 minutes.   POSSIBLE D/C IN 1-2 DAYS, DEPENDING ON CLINICAL CONDITION.  Note: This dictation was prepared with Dragon dictation along with smaller phrase technology. Any transcriptional errors that result from this process are unintentional.   Nicholes Mango M.D on 03/10/2017 at 12:47 PM  Between 7am to 6pm - Pager - [phone] After 6pm go to [email or web address] - password [email or web address] [facility]  [facility]  Office   [phone]  CC: Primary care physician; Leonel Ramsay, MD

## 2017-03-10 NOTE — Consult Note (Signed)
[facility] Infectious Disease     Reason for Consult:PNA    Referring Physician: Nicholes Mango Date of Admission:  03/06/2017   Principal Problem:   Sepsis ([place]) Active Problems:   CAP (community acquired pneumonia)   HTN (hypertension)   Anxiety   HLD (hyperlipidemia)   GERD (gastroesophageal reflux disease)   CKD (chronic kidney disease), stage III ([place])   Pressure injury of skin   HPI: Rita Vang is a 84 y.o. female admitted with fevers, wheeze and cough and found to have infiltrate in R base, wbc 17 temp 102.  Since admit started on cefepime and vanco, MRSA PCR neg so vanco dced.  WBC remains elevated but has received steroids. Overall much improved but still on O2. Having some chest pain and Trop + so seeing cards and getting echo  Past Medical History:  Diagnosis Date  . Anxiety   . CVA (cerebral vascular accident) ([place])   . Dementia   . Depression   . GERD (gastroesophageal reflux disease)   . HLD (hyperlipidemia)   . HTN (hypertension)    Past Surgical History:  Procedure Laterality Date  . CARPAL TUNNEL RELEASE    . CHOLECYSTECTOMY     Social History   Tobacco Use  . Smoking status: Never Smoker  . Smokeless tobacco: Never Used  Substance Use Topics  . Alcohol use: No  . Drug use: Not on file   Family History  Problem Relation Age of Onset  . Heart attack Father   . Alzheimer's disease Mother     Allergies:  Allergies  Allergen Reactions  . Oxycontin [Oxycodone Hcl] Other (See Comments)    Hallucinations     Current antibiotics: Antibiotics Given (last 72 hours)    Date/Time Action Medication Dose Rate   03/07/17 1713 New Bag/Given   ceFEPIme (MAXIPIME) 2 g in sodium chloride 0.9 % 100 mL IVPB 2 g 200 mL/hr   03/07/17 1807 New Bag/Given   vancomycin (VANCOCIN) IVPB 750 mg/150 ml premix 750 mg 150 mL/hr   03/08/17 1721 New Bag/Given   ceFEPIme (MAXIPIME) 2 g in sodium chloride 0.9 % 100 mL IVPB 2 g 200 mL/hr   03/09/17 1730 New  Bag/Given   ceFEPIme (MAXIPIME) 2 g in sodium chloride 0.9 % 100 mL IVPB 2 g 200 mL/hr      MEDICATIONS: . aspirin  325 mg Oral Daily  . citalopram  20 mg Oral Daily  . donepezil  10 mg Oral QHS  . furosemide  20 mg Oral Daily  . galantamine  4 mg Oral BID WC  . heparin  5,000 Units Subcutaneous Q8H  . ipratropium  0.5 mg Nebulization Q6H  . levalbuterol  1.25 mg Nebulization Q6H  . mouth rinse  15 mL Mouth Rinse BID  . methylPREDNISolone (SOLU-MEDROL) injection  40 mg Intravenous TID  . metoprolol succinate  50 mg Oral Daily  . mometasone-formoterol  2 puff Inhalation BID  . pantoprazole  40 mg Oral Daily  . valACYclovir  500 mg Oral BID    Review of Systems - 11 systems reviewed and negative per HPI   OBJECTIVE: Temp:  [97.6 F (36.4 C)-98.6 F (37 C)] 98.3 F (36.8 C) (02/25 0824) Pulse Rate:  [82-109] 109 (02/25 0824) Resp:  [16-21] 21 (02/25 0414) BP: (139-154)/(86-97) 149/91 (02/25 0824) SpO2:  [93 %-96 %] 96 % (02/25 0824) Physical Exam  Constitutional:  Frail, thin HENT: [place]/AT, PERRLA, no scleral icterus Mouth/Throat: Oropharynx is clear and moist. No oropharyngeal  exudate.  Cardiovascular: Normal rate, regular rhythm and normal heart sounds. Exam reveals no gallop and no friction rub.  No murmur heard.  Pulmonary/Chest: poor air movement, mild exp wheeze Neck = supple, no nuchal rigidity Abdominal: Soft. Bowel sounds are normal.  exhibits no distension. There is no tenderness.  Lymphadenopathy: no cervical adenopathy. No axillary adenopathy Neurological: alert and interactive. Skin: Skin is warm and dry. No rash noted. No erythema.  Psychiatric: a normal mood and affect.  behavior is normal.    LABS: Results for orders placed or performed during the hospital encounter of 03/06/17 (from the past 48 hour(s))  CBC     Status: Abnormal   Collection Time: 03/10/17  3:56 AM  Result Value Ref Range   WBC 17.8 (H) 3.6 - 11.0 K/uL   RBC 3.60 (L) 3.80 - 5.20  MIL/uL   Hemoglobin 11.5 (L) 12.0 - 16.0 g/dL   HCT 35.0 35.0 - 47.0 %   MCV 97.1 80.0 - 100.0 fL   MCH 31.9 26.0 - 34.0 pg   MCHC 32.9 32.0 - 36.0 g/dL   RDW 13.4 11.5 - 14.5 %   Platelets 84 (L) 150 - 440 K/uL    Comment: Performed at [facility], [address] [place], [place] [address]  Basic metabolic panel     Status: Abnormal   Collection Time: 03/10/17  3:56 AM  Result Value Ref Range   Sodium 136 135 - 145 mmol/L   Potassium 3.8 3.5 - 5.1 mmol/L   Chloride 107 101 - 111 mmol/L   CO2 21 (L) 22 - 32 mmol/L   Glucose, Bld 120 (H) 65 - 99 mg/dL   BUN 44 (H) 6 - 20 mg/dL   Creatinine, Ser 1.10 (H) 0.44 - 1.00 mg/dL   Calcium 8.4 (L) 8.9 - 10.3 mg/dL   GFR calc non Af Amer 43 (L) >60 mL/min   GFR calc Af Amer 50 (L) >60 mL/min    Comment: (NOTE) The eGFR has been calculated using the CKD EPI equation. This calculation has not been validated in all clinical situations. eGFR's persistently <60 mL/min signify possible Chronic Kidney Disease.    Anion gap 8 5 - 15    Comment: Performed at [facility], [place], [place] [address]  Procalcitonin - Baseline     Status: None   Collection Time: 03/10/17  3:56 AM  Result Value Ref Range   Procalcitonin 2.63 ng/mL    Comment:        Interpretation: PCT > 2 ng/mL: Systemic infection (sepsis) is likely, unless other causes are known. (NOTE)       Sepsis PCT Algorithm           Lower Respiratory Tract                                      Infection PCT Algorithm    ----------------------------     ----------------------------         PCT < 0.25 ng/mL                PCT < 0.10 ng/mL         Strongly encourage             Strongly discourage   discontinuation of antibiotics    initiation of antibiotics    ----------------------------     -----------------------------  PCT 0.25 - 0.50 ng/mL            PCT 0.10 - 0.25 ng/mL               OR       >80% decrease in PCT            Discourage  initiation of                                            antibiotics      Encourage discontinuation           of antibiotics    ----------------------------     -----------------------------         PCT >= 0.50 ng/mL              PCT 0.26 - 0.50 ng/mL               AND       <80% decrease in PCT              Encourage initiation of                                             antibiotics       Encourage continuation           of antibiotics    ----------------------------     -----------------------------        PCT >= 0.50 ng/mL                  PCT > 0.50 ng/mL               AND         increase in PCT                  Strongly encourage                                      initiation of antibiotics    Strongly encourage escalation           of antibiotics                                     -----------------------------                                           PCT <= 0.25 ng/mL                                                 OR                                        > 80% decrease in PCT  Discontinue / Do not initiate                                             antibiotics Performed at [facility], [address] [place], [place] [address]   Troponin I (q 6hr x 3)     Status: Abnormal   Collection Time: 03/10/17 11:16 AM  Result Value Ref Range   Troponin I 0.10 (HH) <0.03 ng/mL    Comment: CRITICAL RESULT CALLED TO, READ BACK BY AND VERIFIED WITH JENNIFER COLE AT 1212 03/10/17 DAS Performed at [facility], [address] [place], [place] [address]    No components found for: ESR, C REACTIVE PROTEIN MICRO: Recent Results (from the past 720 hour(s))  Urine Culture     Status: None   Collection Time: 03/06/17  7:25 PM  Result Value Ref Range Status   Specimen Description   Final    URINE, RANDOM Performed at [facility], [address], [place], [place] [address]    Special Requests   Final     NONE Performed at [facility], [address], [place], [place] [address]    Culture   Final    NO GROWTH Performed at [facility], [place] [address] [place], [place] [address]    Report Status 03/08/2017 FINAL  Final  Blood Culture (routine x 2)     Status: None (Preliminary result)   Collection Time: 03/06/17  7:29 PM  Result Value Ref Range Status   Specimen Description BLOOD BLOOD LEFT FOREARM  Final   Special Requests   Final    BOTTLES DRAWN AEROBIC AND ANAEROBIC Blood Culture adequate volume   Culture   Final    NO GROWTH 4 DAYS Performed at [facility], [address] [place], [place] [address]    Report Status PENDING  Incomplete  Blood Culture (routine x 2)     Status: None (Preliminary result)   Collection Time: 03/06/17  7:29 PM  Result Value Ref Range Status   Specimen Description BLOOD BLOOD RIGHT WRIST  Final   Special Requests   Final    BOTTLES DRAWN AEROBIC AND ANAEROBIC Blood Culture adequate volume   Culture   Final    NO GROWTH 4 DAYS Performed at [facility], [address], [place], [place] [address]    Report Status PENDING  Incomplete  MRSA PCR Screening     Status: None   Collection Time: 03/06/17 10:49 PM  Result Value Ref Range Status   MRSA by PCR NEGATIVE NEGATIVE Final    Comment:        The GeneXpert MRSA Assay (FDA approved for NASAL specimens only), is one component of a comprehensive MRSA colonization surveillance program. It is not intended to diagnose MRSA infection nor to guide or monitor treatment for MRSA infections. Performed at [facility], [address] [place],  [address]     IMAGING: Dg Chest Port 1 View  Result Date: 03/09/2017 CLINICAL DATA:  Cough and shortness of breath. EXAM: PORTABLE CHEST 1 VIEW COMPARISON:  March 06, 2017 FINDINGS: The heart, hila, and mediastinum are unchanged. Mild pulmonary venous congestion. Patchy infiltrate developing in the right  base. Healed rib fractures on the left. No other acute abnormalities. IMPRESSION: 1. Patchy infiltrate developing in the right base. Mild pulmonary venous congestion. Recommend  follow-up to resolution. Electronically Signed   By: Dorise Bullion III M.D   On: 03/09/2017 12:41   Dg Chest Port 1 View  Result Date: 03/06/2017 CLINICAL DATA:  Fever weakness today.  Cough. EXAM: PORTABLE CHEST 1 VIEW COMPARISON:  07/10/2010 FINDINGS: Lungs are adequately inflated as patient is rotated to the left. There is hazy perihilar opacification likely due to mild asymmetric interstitial edema right greater than left. Right infrahilar infection is possible. No effusion. Mild cardiomegaly. Calcified plaque over the thoracic aorta. Degenerative change of the spine. Multiple old left posterior rib fractures. IMPRESSION: Hazy bilateral perihilar opacification right greater than left likely asymmetric edema, although infection in the right infrahilar region is possible. Cardiomegaly. Electronically Signed   By: Marin Olp M.D.   On: 03/06/2017 20:18    Assessment:   JOSILYN SHIPPEE is a 84 y.o. female admitted with wheezing, SOB cough and confusion. On admit had wbc 19, temp 102.  She had cx with possible evolving R sided PNA. Started on cefepime and vancomycin. MRSA PCR neg so stopped vanco. Has + troponins as well.  Recommendations Add azithromycin. Cont cefepime. Check Resp PCR Check urine lg and strep PNA ag  Thank you very much for allowing me to participate in the care of this patient. Please call with questions.   Cheral Marker. Ola Spurr, MD

## 2017-03-10 NOTE — Progress Notes (Signed)
Cough medicine and Tylenol were both effective. Currently patient is resting soundly. Will continue to monitor patient to end of shift.

## 2017-03-11 ENCOUNTER — Encounter
Admission: RE | Admit: 2017-03-11 | Discharge: 2017-03-11 | Disposition: A | Discharge status: Home / Self Care | Payer: Medicare Other | Source: Ambulatory Visit | Attending: Internal Medicine | Admitting: Internal Medicine

## 2017-03-11 LAB — CULTURE, BLOOD (ROUTINE X 2)
Culture: NO GROWTH
Culture: NO GROWTH
Special Requests: ADEQUATE
Special Requests: ADEQUATE

## 2017-03-11 LAB — LIPID PANEL
CHOLESTEROL: 154 mg/dL (ref 0–200)
HDL: 34 mg/dL — AB (ref >40)
LDL Cholesterol: 79 mg/dL (ref 0–99)
TRIGLYCERIDES: 203 mg/dL — AB (ref <150)
Total CHOL/HDL Ratio: 4.5 RATIO
VLDL: 41 mg/dL — ABNORMAL HIGH (ref 0–40)

## 2017-03-11 LAB — ECHOCARDIOGRAM COMPLETE
Height: 62 in
WEIGHTICAEL: 2080 [oz_av]

## 2017-03-11 LAB — RESPIRATORY PANEL BY PCR
ADENOVIRUS-RVPPCR: NOT DETECTED
Bordetella pertussis: NOT DETECTED
CHLAMYDOPHILA PNEUMONIAE-RVPPCR: NOT DETECTED
CORONAVIRUS 229E-RVPPCR: NOT DETECTED
CORONAVIRUS NL63-RVPPCR: NOT DETECTED
CORONAVIRUS OC43-RVPPCR: DETECTED — AB
Coronavirus HKU1: NOT DETECTED
INFLUENZA A-RVPPCR: NOT DETECTED
INFLUENZA B-RVPPCR: NOT DETECTED
MYCOPLASMA PNEUMONIAE-RVPPCR: NOT DETECTED
Metapneumovirus: NOT DETECTED
PARAINFLUENZA VIRUS 1-RVPPCR: NOT DETECTED
PARAINFLUENZA VIRUS 4-RVPPCR: NOT DETECTED
Parainfluenza Virus 2: NOT DETECTED
Parainfluenza Virus 3: NOT DETECTED
RESPIRATORY SYNCYTIAL VIRUS-RVPPCR: NOT DETECTED
Rhinovirus / Enterovirus: NOT DETECTED

## 2017-03-11 LAB — STREP PNEUMONIAE URINARY ANTIGEN: Strep Pneumo Urinary Antigen: NEGATIVE

## 2017-03-11 LAB — TROPONIN I: Troponin I: 0.07 ng/mL (ref <0.03)

## 2017-03-11 MED ORDER — LEVALBUTEROL HCL 1.25 MG/0.5ML IN NEBU
1.2500 mg | INHALATION_SOLUTION | Freq: Three times a day (TID) | RESPIRATORY_TRACT | Status: DC
  Administered 2017-03-11 – 2017-03-12 (×3): 1.25 mg via RESPIRATORY_TRACT
  Filled 2017-03-11 (×3): qty 0.5

## 2017-03-11 MED ORDER — IPRATROPIUM BROMIDE 0.02 % IN SOLN
0.5000 mg | Freq: Three times a day (TID) | RESPIRATORY_TRACT | Status: DC
  Administered 2017-03-11 – 2017-03-12 (×3): 0.5 mg via RESPIRATORY_TRACT
  Filled 2017-03-11 (×3): qty 2.5

## 2017-03-11 NOTE — Consult Note (Signed)
[facility] Card[facility] Consultation Note  Patient ID: Rita Vang, MRN: [id number], DOB/AGE: 82/06/1924 82 y.o. Admit date: 03/06/2017   Date of Consult: 03/11/2017 Primary Physician: Mick SellFitzgerald, David P, MD Primary Cardiologist: None  Chief Complaint:  Chief Complaint  Patient presents with  . Fever  . Cough   Reason for Consult: Elevated troponin shortness of breath  HPI: 84 y.o. female with no evidence of previous cardiovascular disease has a chronic kidney disease stage III atrial fibrillation with controlled ventricular rate as well as new onset and atypical chest discomfort.  This patient has had atypical chest discomfort for the last 24-48 hours and there is been no evidence of myocardial infarction with a troponin of 0.09 an EKG showing atrial fibrillation with controlled ventricular rate.  There has been some improvements with symptoms and with supportive care for possible pneumonia and infection.  Is likely that her current condition of pulmonary infection is likely driving her chest pain and minimal elevation  Past Medical History:  Diagnosis Date  . Anxiety   . CVA (cerebral vascular accident) ([facility])   . Dementia   . Depression   . GERD (gastroesophageal reflux disease)   . HLD (hyperlipidemia)   . HTN (hypertension)       Surgical History:  Past Surgical History:  Procedure Laterality Date  . CARPAL TUNNEL RELEASE    . CHOLECYSTECTOMY       Home Meds: Prior to Admission medications   Medication Sig Start Date End Date Taking? Authorizing Provider  aspirin 325 MG tablet Take 1 tablet (325 mg total) by mouth daily. 08/24/14  Yes Gale JourneyWalsh, Catherine P, MD  cetirizine (ZYRTEC) 10 MG tablet Take 10 mg by mouth as needed for allergies.   Yes [provider]  cholecalciferol (VITAMIN D) 1000 UNITS tablet Take 1,000 Units by mouth daily.   Yes [provider]  citalopram (CELEXA) 20 MG tablet Take 1 tablet by mouth daily. 12/30/13  Yes [provider]  donepezil (ARICEPT) 10 MG tablet Take 1 tablet by mouth at bedtime. 02/02/14  Yes [provider]  fluticasone (FLONASE) 50 MCG/ACT nasal spray Place 1 spray into both nostrils daily. 02/24/14  Yes [provider]  Fluticasone-Salmeterol (ADVAIR) 100-50 MCG/DOSE AEPB Inhale 1 puff into the lungs 2 (two) times daily.   Yes [provider]  galantamine (RAZADYNE) 4 MG tablet Take 4 mg by mouth 2 (two) times daily with a meal.   Yes [provider]  Glucosamine-Chondroit-Vit C-Mn (GLUCOSAMINE 1500 COMPLEX PO) Take 1 capsule by mouth daily.   Yes [provider]  glycopyrrolate (ROBINUL) 1 MG tablet Take 1 mg by mouth daily.   Yes [provider]  metoprolol succinate (TOPROL-XL) 50 MG 24 hr tablet Take 1 tablet by mouth daily. 07/19/14  Yes [provider]  Multiple Vitamins-Minerals (I-VITE PROTECT PO) Take 1 tablet by mouth daily.   Yes [provider]  polyethylene glycol (MIRALAX) packet Take 17 g by mouth as needed.   Yes [provider]  QUEtiapine (SEROQUEL) 25 MG tablet Take 1 tablet by mouth at bedtime. 01/24/14  Yes [provider]  simvastatin (ZOCOR) 20 MG tablet Take 20 mg by mouth every evening.   Yes [provider]  traMADol (ULTRAM) 50 MG tablet Take 50 mg by mouth 2 (two) times daily.   Yes [provider]  valACYclovir (VALTREX) 500 MG tablet Take 500 mg by mouth 2 (two) times daily.    Yes [provider]  meclizine (ANTIVERT) 25 MG tablet Take 1 tablet by mouth as needed.    [provider]    Inpatient Medications:  . aspirin  325 mg Oral Daily  . azithromycin  250 mg Oral Daily  . citalopram  20 mg Oral Daily  . donepezil  10 mg Oral QHS  . furosemide  20 mg Oral Daily  . galantamine  4 mg Oral BID WC  . heparin  5,000 Units Subcutaneous Q8H  . ipratropium  0.5 mg Nebulization Q6H  . levalbuterol  1.25 mg Nebulization Q6H  . mouth  rinse  15 mL Mouth Rinse BID  . methylPREDNISolone (SOLU-MEDROL) injection  40 mg Intravenous TID  . metoprolol succinate  50 mg Oral Daily  . mometasone-formoterol  2 puff Inhalation BID  . pantoprazole  40 mg Oral Daily  . valACYclovir  500 mg Oral BID   . ceFEPime (MAXIPIME) IV Stopped (03/10/17 1803)    Allergies:  Allergies  Allergen Reactions  . Oxycontin [Oxycodone Hcl] Other (See Comments)    Hallucinations     Social History   Socioeconomic History  . Marital status: Widowed    Spouse name: Not on file  . Number of children: Not on file  . Years of education: Not on file  . Highest education level: Not on file  Social Needs  . Financial resource strain: Not on file  . Food insecurity - worry: Not on file  . Food insecurity - inability: Not on file  . Transportation needs - medical: Not on file  . Transportation needs - non-medical: Not on file  Occupational History  . Not on file  Tobacco Use  . Smoking status: Never Smoker  . Smokeless tobacco: Never Used  Substance and Sexual Activity  . Alcohol use: No  . Drug use: Not on file  . Sexual activity: Not on file  Other Topics Concern  . Not on file  Social History Narrative  . Not on file     Family History  Problem Relation Age of Onset  . Heart attack Father   . Alzheimer's disease Mother      Review of Systems Positive for shortness of breath chest pain cough congestion Negative for: General:  chills, fever, night sweats or weight changes.  Cardiovascular: PND orthopnea syncope dizziness  Dermatological skin lesions rashes Respiratory: Positive for cough congestion Urologic: Frequent urination urination at night and hematuria Abdominal: negative for nausea, vomiting, diarrhea, bright red blood per rectum, melena, or hematemesis Neurologic: negative for visual changes, and/or hearing changes  All other systems reviewed and are otherwise negative except as noted above.  Labs: Recent Labs     03/10/17 1116 03/10/17 2002 03/11/17 0144  TROPONINI 0.10* 0.09* 0.07*   Lab Results  Component Value Date   WBC 17.8 (H) 03/10/2017   HGB 11.5 (L) 03/10/2017   HCT 35.0 03/10/2017   MCV 97.1 03/10/2017   PLT 84 (L) 03/10/2017    Recent Labs  Lab 03/06/17 1928  03/10/17 0356  NA 137   < > 136  K 3.8   < > 3.8  CL 104   < > 107  CO2 22   < > 21*  BUN 35*   < > 44*  CREATININE 1.26*   < > 1.10*  CALCIUM 8.9   < > 8.4*  PROT 6.6  --   --   BILITOT 0.5  --   --   ALKPHOS 105  --   --  ALT 23  --   --   AST 37  --   --   GLUCOSE 115*   < > 120*   < > = values in this interval not displayed.   Lab Results  Component Value Date   CHOL 154 03/11/2017   HDL 34 (L) 03/11/2017   LDLCALC 79 03/11/2017   TRIG 203 (H) 03/11/2017   No results found for: DDIMER  Radiology/Studies:  Dg Chest Port 1 View  Result Date: 03/09/2017 CLINICAL DATA:  Cough and shortness of breath. EXAM: PORTABLE CHEST 1 VIEW COMPARISON:  March 06, 2017 FINDINGS: The heart, hila, and mediastinum are unchanged. Mild pulmonary venous congestion. Patchy infiltrate developing in the right base. Healed rib fractures on the left. No other acute abnormalities. IMPRESSION: 1. Patchy infiltrate developing in the right base. Mild pulmonary venous congestion. Recommend follow-up to resolution. Electronically Signed   By: Gerome Sam III M.D   On: 03/09/2017 12:41   Dg Chest Port 1 View  Result Date: 03/06/2017 CLINICAL DATA:  Fever weakness today.  Cough. EXAM: PORTABLE CHEST 1 VIEW COMPARISON:  07/10/2010 FINDINGS: Lungs are adequately inflated as patient is rotated to the left. There is hazy perihilar opacification likely due to mild asymmetric interstitial edema right greater than left. Right infrahilar infection is possible. No effusion. Mild cardiomegaly. Calcified plaque over the thoracic aorta. Degenerative change of the spine. Multiple old left posterior rib fractures. IMPRESSION: Hazy bilateral  perihilar opacification right greater than left likely asymmetric edema, although infection in the right infrahilar region is possible. Cardiomegaly. Electronically Signed   By: Elberta Fortis M.D.   On: 03/06/2017 20:18    EKG: Atrial fibrillation with controlled ventricular rate  Weights: Filed Weights   03/06/17 1921 03/06/17 2144  Weight: 130 lb (59 kg) 130 lb (59 kg)     Physical Exam: Blood pressure 138/75, pulse 95, temperature (!) 97.5 F (36.4 C), temperature source Oral, resp. rate (!) 22, height 5\' 2"  (1.575 m), weight 130 lb (59 kg), SpO2 96 %. Body mass index is 23.78 kg/m. General: Well developed, well nourished, in no acute distress. Head eyes ears nose throat: Normocephalic, atraumatic, sclera non-icteric, no xanthomas, nares are without discharge. No apparent thyromegaly and/or mass  Lungs: Normal respiratory effort.  Some wheezes, few rales, diffuse rhonchi.  Heart: Irregular with normal S1 S2. no murmur gallop, no rub, PMI is normal size and placement, carotid upstroke normal without bruit, jugular venous pressure is normal Abdomen: Soft, non-tender, non-distended with normoactive bowel sounds. No hepatomegaly. No rebound/guarding. No obvious abdominal masses. Abdominal aorta is normal size without bruit Extremities: Trace edema. no cyanosis, no clubbing, no ulcers  Peripheral : 2+ bilateral upper extremity pulses, 2+ bilateral femoral pulses, 2+ bilateral dorsal pedal pulse Neuro: Alert and oriented. No facial asymmetry. No focal deficit. Moves all extremities spontaneously. Musculoskeletal: Normal muscle tone without kyphosis Psych:  Responds to questions appropriately with a normal affect.    Assessment: 84 year old female with acute atypical chest discomfort or shortness of breath cough congestion with pulmonary infection and no current evidence of myocardial infarction or acute coronary syndrome but demand ischemia from above with a troponin of 0.09 and slight  elevation of BNP  Plan: 1.  Continue pulmonary infection shortness of breath cough and congestion 2.  Further consideration of echocardiogram for LV systolic dysfunction valvular heart disease contributing to above 3.  Furosemide for mild pulmonary edema and/or pleural effusion watching closely for worsening chronic kidney disease 4.  Metoprolol for  heart rate control of atrial fibrillation 5.  Further treatment options after above  Signed, Lamar Blinks M.D. [facility] Cardiology 03/11/2017, 9:06 AM

## 2017-03-11 NOTE — Progress Notes (Signed)
[facility] [place] NAME: Rita Vang    MR#:  [id number]  DATE OF BIRTH:  1924-07-27  SUBJECTIVE:  CHIEF COMPLAINT:   [name] wide awake and alert and resting comfortably.  Denies any chest pain during my examination   REVIEW OF SYSTEMS:  Limited review of system  RESPIRATORY: Reporting cough, improving shortness of breath, denies wheezing or hemoptysis.  CARDIOVASCULAR: No chest pain, orthopnea, edema.  GASTROINTESTINAL: No nausea, vomiting, diarrhea or abdominal pain.  MUSCULOSKELETAL: No joint pain or arthritis.   NEUROLOGIC: No tingling, numbness, weakness.  PSYCHIATRY: No anxiety or depression.      DRUG ALLERGIES:   Allergies  Allergen Reactions  . Oxycontin [Oxycodone Hcl] Other (See Comments)    Hallucinations     VITALS:  Blood pressure (!) 143/85, pulse 88, temperature 98.9 F (37.2 C), temperature source Oral, resp. rate (!) 22, height _0  (1.575 m), weight 59 kg (130 lb), SpO2 91 %.  PHYSICAL EXAMINATION:  GENERAL:  84 y.o.-year-old patient lying in the bed with no acute distress.  EYES: Pupils equal, round, reactive to light and accommodation. No scleral icterus. Extraocular muscles intact.  HEENT: Head atraumatic, normocephalic. Oropharynx and nasopharynx clear.  NECK:  Supple, no jugular venous distention. No thyroid enlargement, no tenderness.  LUNGS: Moderate coarse bronchial breath sounds bilaterally, no wheezing, rales,rhonchi or crepitation. No use of accessory muscles of respiration.  CARDIOVASCULAR: S1, S2 normal. No murmurs, rubs, or gallops.  ABDOMEN: Soft, nontender, nondistended. Bowel sounds present.  EXTREMITIES: No pedal edema, cyanosis, or clubbing.  NEUROLOGIC: Patient has chronic dementia but awake and alert oriented x1-2  pSYCHIATRIC: The patient is alert and oriented x 1-2  SKIN: No obvious rash, lesion, or ulcer.    LABORATORY PANEL:   CBC Recent Labs  Lab  03/10/17 0356  WBC 17.8*  HGB 11.5*  HCT 35.0  PLT 84*   ------------------------------------------------------------------------------------------------------------------  Chemistries  Recent Labs  Lab 03/06/17 1928  03/10/17 0356  NA 137   < > 136  K 3.8   < > 3.8  CL 104   < > 107  CO2 22   < > 21*  GLUCOSE 115*   < > 120*  BUN 35*   < > 44*  CREATININE 1.26*   < > 1.10*  CALCIUM 8.9   < > 8.4*  AST 37  --   --   ALT 23  --   --   ALKPHOS 105  --   --   BILITOT 0.5  --   --    < > = values in this interval not displayed.   ------------------------------------------------------------------------------------------------------------------  Cardiac Enzymes Recent Labs  Lab 03/11/17 0144  TROPONINI 0.07*   ------------------------------------------------------------------------------------------------------------------  RADIOLOGY:  No results found.  EKG:   Orders placed or performed during the hospital encounter of 03/06/17  . EKG 12-Lead  . EKG 12-Lead  . ED EKG 12-Lead  . ED EKG 12-Lead  . EKG 12-Lead  . EKG 12-Lead  . EKG 12-Lead  . EKG 12-Lead    ASSESSMENT AND PLAN:   Chest pain with elevated troponin Monitor patient on telemetry Twelve-lead EKG with no acute ST-T wave changes but in A. Fib Continue aspirin, beta-blocker metoprolol and statin Continue oxygen via nasal cannula Cardiology consulted, seen by Dr. Nehemiah Massed Recommending furosemide for mild pulmonary edema with close monitoring of the renal function Echocardiogram  cardiac biomarkers 0.1-0.09-0.07   Sepsis ([place]) -secondary to community-acquired pneumonia  Patient met septic criteria with hypotension and tachycardia at the time of admission  IV antibiotics given, lactic acid within normal limit  blood cultures and urine cultures are negative.  MRSA PCR is negative    CAP (community acquired pneumonia) with acute bronchitis -IV antibiotics cefepime , azithromycin is added for  atypical coverage  Flu negative Respiratory panel coronavirus positive, ID is recommending to stop cefepime and continue azithromycin for a total of 5 days  supportive treatment PRN Urine for streptococcal antigen is negative and Legionella is pending Appreciate ID recommendations Solu-Medrol for bronchodilatation, bronchodilator treatment Vancomycin discontinued as MRSA PCR is negative  repeat chest x-ray-pneumonia is not getting worse Hold Seroquel  Atrial fibrillation with RVR-from underlying sepsis IV Lopressor as needed On metoprolol and aspirin    HTN (hypertension) -blood pressure is better Resume home medication metoprolol succinate 50  mg    Anxiety -home dose anxiolytics    Chronic history of dementia continue Aricept and galantamine    GERD (gastroesophageal reflux disease) -home dose PPI    CKD (chronic kidney disease), stage III (HCC) -at baseline, avoid nephrotoxins and monitor   Generalized weakness PT evaluation recommending skilled nursing facility.  Son is agreeable.  Consult social worker regarding placement-   All the records are reviewed and case discussed with Care Management/Social Workerr. Management plans discussed with the patient's son at bed side  CODE STATUS: fc , son Nicki Reaper is the healthcare power of attorney  TOTAL TIME TAKING CARE OF THIS PATIENT: 36 minutes.: 36 minutes.   POSSIBLE D/C IN 1-2 DAYS, DEPENDING ON CLINICAL CONDITION.  Note: This dictation was prepared with Dragon dictation along with smaller phrase technology. Any transcriptional errors that result from this process are unintentional.   Nicholes Mango M.D on 03/11/2017 at 9:14 PM  Between 7am to 6pm - Pager - [phone] After 6pm go to [email or web address] - password [email or web address] [facility]  [facility]  Office  [phone]  CC: Primary care physician; Leonel Ramsay, MD

## 2017-03-11 NOTE — Progress Notes (Signed)
[name] positive for Coronavirus, which does not require isolation [facility]. Patient and her son Rita Vang are aware of above.  Baker [employer]Bailey Perla Echavarria, LCSW  [phone]

## 2017-03-11 NOTE — Progress Notes (Signed)
[facility] PROGR[facility] NOTE Date of Admission:  03/06/2017     ID: Rita Vang is a 84 y.o. female with Bronchitis  Principal Problem:   Sepsis (HCC) Active Problems:   CAP (community acquired pneumonia)   HTN (hypertension)   Anxiety   HLD (hyperlipidemia)   GERD (gastroesophageal reflux disease)   CKD (chronic kidney disease), stage III (HCC)   Pressure injury of skin   Subjective: More alert, less sob but still on O2.  ROS  Unable to obtain  Medications:  Antibiotics Given (last 72 hours)    Date/Time Action Medication Dose Rate   03/08/17 1721 New Bag/Given   ceFEPIme (MAXIPIME) 2 g in sodium chloride 0.9 % 100 mL IVPB 2 g 200 mL/hr   03/09/17 1730 New Bag/Given   ceFEPIme (MAXIPIME) 2 g in sodium chloride 0.9 % 100 mL IVPB 2 g 200 mL/hr   03/10/17 1701 New Bag/Given   ceFEPIme (MAXIPIME) 2 g in sodium chloride 0.9 % 100 mL IVPB 2 g 200 mL/hr   03/10/17 1701 Given   valACYclovir (VALTREX) tablet 500 mg 500 mg    03/10/17 1701 Given   azithromycin (ZITHROMAX) tablet 500 mg 500 mg    03/11/17 1049 Given   valACYclovir (VALTREX) tablet 500 mg 500 mg    03/11/17 1050 Given   azithromycin (ZITHROMAX) tablet 250 mg 250 mg      . aspirin  325 mg Oral Daily  . azithromycin  250 mg Oral Daily  . citalopram  20 mg Oral Daily  . donepezil  10 mg Oral QHS  . furosemide  20 mg Oral Daily  . galantamine  4 mg Oral BID WC  . heparin  5,000 Units Subcutaneous Q8H  . ipratropium  0.5 mg Nebulization Q6H  . levalbuterol  1.25 mg Nebulization Q6H  . mouth rinse  15 mL Mouth Rinse BID  . methylPREDNISolone (SOLU-MEDROL) injection  40 mg Intravenous TID  . metoprolol succinate  50 mg Oral Daily  . mometasone-formoterol  2 puff Inhalation BID  . pantoprazole  40 mg Oral Daily  . valACYclovir  500 mg Oral BID    Objective: Vital signs in last 24 hours: Temp:  [97.5 F (36.4 C)-98.4 F (36.9 C)] 97.5 F (36.4 C) (02/26 0833) Pulse Rate:  [94-98] 98  (02/26 1049) Resp:  [18-22] 22 (02/26 0833) BP: (136-154)/(74-84) 137/76 (02/26 1049) SpO2:  [95 %-96 %] 96 % (02/26 0833) FiO2 (%):  [28 %] 28 % (02/26 0740) Constitutional:  Frail, thin HENT: [place]/AT, PERRLA, no scleral icterus Mouth/Throat: Oropharynx is clear and moist. No oropharyngeal exudate.  Cardiovascular: Normal rate, regular rhythm and normal heart sounds. Exam reveals no gallop and no friction rub.  No murmur heard.  Pulmonary/Chest: poor air movement, mild exp wheeze Neck = supple, no nuchal rigidity Abdominal: Soft. Bowel sounds are normal.  exhibits no distension. There is no tenderness.  Lymphadenopathy: no cervical adenopathy. No axillary adenopathy Neurological: alert and interactive but cannot recall my name Skin: Skin is warm and dry. No rash noted. No erythema.  Psychiatric: a normal mood and affect.  behavior is normal.     Lab Results Recent Labs    03/10/17 0356  WBC 17.8*  HGB 11.5*  HCT 35.0  NA 136  K 3.8  CL 107  CO2 21*  BUN 44*  CREATININE 1.10*    Microbiology: Results for orders placed or performed during the hospital encounter of 03/06/17  Urine Culture  Status: None   Collection Time: 03/06/17  7:25 PM  Result Value Ref Range Status   Specimen Description   Final    URINE, RANDOM Performed at [facility], [address] [place], [place] [address]    Special Requests   Final    NONE Performed at [facility], [address] [place], [place] [address]    Culture   Final    NO GROWTH Performed at [facility], [address] [place], [place] [address]    Report Status 03/08/2017 FINAL  Final  Blood Culture (routine x 2)     Status: None   Collection Time: 03/06/17  7:29 PM  Result Value Ref Range Status   Specimen Description BLOOD BLOOD LEFT FOREARM  Final   Special Requests   Final    BOTTLES DRAWN AEROBIC AND ANAEROBIC Blood Culture adequate volume   Culture   Final    NO GROWTH 5  DAYS Performed at [facility], [address] [place], [place] [address]    Report Status 03/11/2017 FINAL  Final  Blood Culture (routine x 2)     Status: None   Collection Time: 03/06/17  7:29 PM  Result Value Ref Range Status   Specimen Description BLOOD BLOOD RIGHT WRIST  Final   Special Requests   Final    BOTTLES DRAWN AEROBIC AND ANAEROBIC Blood Culture adequate volume   Culture   Final    NO GROWTH 5 DAYS Performed at [facility], [address] [place], [place] [address]    Report Status 03/11/2017 FINAL  Final  MRSA PCR Screening     Status: None   Collection Time: 03/06/17 10:49 PM  Result Value Ref Range Status   MRSA by PCR NEGATIVE NEGATIVE Final    Comment:        The GeneXpert MRSA Assay (FDA approved for NASAL specimens only), is one component of a comprehensive MRSA colonization surveillance program. It is not intended to diagnose MRSA infection nor to guide or monitor treatment for MRSA infections. Performed at [facility], [address], [place], [place] [address]     Studies/Results: No results found.  Assessment/Plan: Rita Vang is a 84 y.o. female admitted with wheezing, SOB cough and confusion. On admit had wbc 19, temp 102.  She had cx with possible evolving R sided PNA. Started on cefepime and vancomycin. MRSA PCR neg so stopped vanco. Has + troponins as well seen by cardiology.  Resp PCR pending. Strep PNA neg. U lg pendimg 2/26- clinically improving but still on O2 and some wheezing. Suspect some of the wheezing is due to fluid.  She is on lasix.  Still on solumedrol 40 tid. Day 6 abx.   Recommendations Cont azithromycin. Cont cefepime.  Would continue treatment of possible mild volume overload and with steroids but consider weaning steroids some.  Thank you very much for the consult. Will follow with you.  Mick Sell   03/11/2017, 1:56 PM

## 2017-03-11 NOTE — Progress Notes (Signed)
Physical Therapy Treatment Patient Details Name: Rita SnareFrances L Boberg MRN: [id number] DOB: 06/23/1924 Today's Date: 03/11/2017    History of Present Illness Pt admitted for sepsis, hypoxia and multifocal pneumonia.  PMH includes Htn, HLD, GERD, depression, dementia, R CVA and anxiety.    PT Comments    Pt is able to participate with bed exercises with therapist. She requires maxA+1 with blocking at the knees to come to standing. Once upright pt requires modA+1 to remain in standing. Pt only tolerates standing for short bout and then returns to sitting. She is too weak/unsafe to attempt ambulation. Pt will need SNF placement at discharge. Pt will benefit from PT services to address deficits in strength, balance, and mobility in order to return to full function at home.     Follow Up Recommendations  SNF     Equipment Recommendations  Other (comment)(TBD at next venue)    Recommendations for Other Services       Precautions / Restrictions Precautions Precautions: Fall Restrictions Weight Bearing Restrictions: No    Mobility  Bed Mobility Overal bed mobility: Needs Assistance Bed Mobility: Supine to Sit;Sit to Supine     Supine to sit: Max assist Sit to supine: Max assist   General bed mobility comments: Heavy cues and assist to come upright to sitting. ModA+1 to remain upright due to posterior LOB. Poor safety awareness  Transfers Overall transfer level: Needs assistance Equipment used: Rolling walker (2 wheeled) Transfers: Sit to/from Stand Sit to Stand: Max assist         General transfer comment: Pt requires maxA+1 with blocking at the knees to come to standing. Once upright pt requires modA+1 to remain in standing. Pt only tolerates standing for short bout and then returns to sitting. She is too weak/unsafe to attempt ambulation  Ambulation/Gait Ambulation/Gait assistance: (Unable to perform.)               Stairs            Wheelchair Mobility     Modified Rankin (Stroke Patients Only)       Balance Overall balance assessment: Needs assistance Sitting-balance support: Bilateral upper extremity supported Sitting balance-Leahy Scale: Poor Sitting balance - Comments: Pt requires support in sitting to remain upright                                    Cognition Arousal/Alertness: Awake/alert Behavior During Therapy: WFL for tasks assessed/performed Overall Cognitive Status: History of cognitive impairments - at baseline                                 General Comments: AOx2 at time of PT treatment which is patient's baseline per caregiver      Exercises General Exercises - Lower Extremity Ankle Circles/Pumps: AROM;Both;10 reps Quad Sets: Strengthening;Both;5 reps Short Arc Quad: Both;10 reps Heel Slides: Both;10 reps Hip ABduction/ADduction: Both;10 reps Straight Leg Raises: Both;10 reps    General Comments        Pertinent Vitals/Pain Pain Assessment: No/denies pain    Home Living                      Prior Function            PT Goals (current goals can now be found in the care plan section) Acute Rehab PT Goals Patient Stated  Goal: To return home as soon as possible. PT Goal Formulation: With patient/family Time For Goal Achievement: 03/23/17 Potential to Achieve Goals: Good Progress towards PT goals: Progressing toward goals    Frequency    Min 2X/week      PT Plan Current plan remains appropriate    Co-evaluation              AM-PAC PT "6 Clicks" Daily Activity  Outcome Measure  Difficulty turning over in bed (including adjusting bedclothes, sheets and blankets)?: Unable Difficulty moving from lying on back to sitting on the side of the bed? : Unable Difficulty sitting down on and standing up from a chair with arms (e.g., wheelchair, bedside commode, etc,.)?: Unable Help needed moving to and from a bed to chair (including a wheelchair)?:  Total Help needed walking in hospital room?: Total Help needed climbing 3-5 steps with a railing? : Total 6 Click Score: 6    End of Session Equipment Utilized During Treatment: Gait belt;Oxygen Activity Tolerance: Patient tolerated treatment well Patient left: in bed;with call bell/phone within reach;with family/visitor present;with bed alarm set   PT Visit Diagnosis: History of falling (Z91.81);Muscle weakness (generalized) (M62.81)     Time: 1610-9604 PT Time Calculation (min) (ACUTE ONLY): 25 min  Charges:  $Therapeutic Exercise: 8-22 mins $Therapeutic Activity: 8-22 mins                    G Codes:       Sharalyn Ink Rose Hippler PT, DPT     Harini Dearmond 03/11/2017, 5:31 PM

## 2017-03-11 NOTE — Progress Notes (Addendum)
Per RN patient is on droplet precautions for rule out legionnaires. [facility] for patient. Patient will have to go to a facility with a private room pending legionnaires results. [name] aware of above. Clinical Child psychotherapistocial Worker (CSW) contacted patient's son [name] and made him aware of above.   Baker [employer]iley Mykaela Arena, LCSW [phone]

## 2017-03-11 NOTE — Progress Notes (Signed)
ID addendum Resp PCR + coronavirus Suspect causing acute bronchitis and the wheezing Stop cefepime and cont azithromycin x 5 days

## 2017-03-12 LAB — LEGIONELLA PNEUMOPHILA SEROGP 1 UR AG: L. PNEUMOPHILA SEROGP 1 UR AG: NEGATIVE

## 2017-03-12 MED ORDER — VALACYCLOVIR HCL 500 MG PO TABS: 500.0000 mg | ORAL_TABLET | Freq: Two times a day (BID) | ORAL | 0 refills | Status: DC

## 2017-03-12 MED ORDER — AZITHROMYCIN 250 MG PO TABS: ORAL_TABLET | ORAL | 0 refills | Status: AC

## 2017-03-12 MED ORDER — LEVALBUTEROL HCL 1.25 MG/0.5ML IN NEBU: 1.2500 mg | INHALATION_SOLUTION | Freq: Three times a day (TID) | RESPIRATORY_TRACT | 12 refills | Status: DC

## 2017-03-12 MED ORDER — LEVALBUTEROL HCL 1.25 MG/0.5ML IN NEBU: 1.2500 mg | INHALATION_SOLUTION | RESPIRATORY_TRACT | 12 refills | Status: DC | PRN

## 2017-03-12 MED ORDER — FUROSEMIDE 20 MG PO TABS: 20.0000 mg | ORAL_TABLET | Freq: Every day | ORAL | 0 refills | Status: DC

## 2017-03-12 MED ORDER — PREDNISONE 10 MG PO TABS: 10.0000 mg | ORAL_TABLET | Freq: Every day | ORAL | 0 refills | Status: DC

## 2017-03-12 MED ORDER — GUAIFENESIN-DM 100-10 MG/5ML PO SYRP: 5.0000 mL | ORAL_SOLUTION | ORAL | 0 refills | Status: DC | PRN

## 2017-03-12 MED ORDER — IPRATROPIUM BROMIDE 0.02 % IN SOLN: 0.5000 mg | Freq: Three times a day (TID) | RESPIRATORY_TRACT | 12 refills | Status: DC

## 2017-03-12 MED ORDER — ALUM & MAG HYDROXIDE-SIMETH 200-200-20 MG/5ML PO SUSP: 30.0000 mL | Freq: Four times a day (QID) | ORAL | 0 refills | Status: DC | PRN

## 2017-03-12 NOTE — Progress Notes (Signed)
[name] medically stable [facility] today to room 210[facility]B. RN will call report at [phone] and arrange EMS for transport. Clinical Child psychotherapistocial Worker (CSW) sent [facility] orders [facility] via [employer]. [name] aware of above. CSW contacted patient's son Rita Vang and made him aware of above. Please reconsult if future social work needs arise. CSW signing off.   Baker Lav[employer]Bailey Dealva Lafoy, LCSW [phone]

## 2017-03-12 NOTE — Discharge Instructions (Signed)
Continue 2 L of oxygen and wean off as tolerated Follow-up with primary care physician Dr. Sampson GoonFitzgerald in 1 week Follow-up with primary care physician at the facility in 2-3 days

## 2017-03-12 NOTE — Progress Notes (Signed)
[facility]  Patient: Rita Vang / Admit Date: 03/06/2017 / Date of Encounter: 03/12/2017, 8:49 AM   Subjective: The patient feels relatively well today.  No evidence of chest pain or shortness of breath.  Atrial fibrillation by telemetry with controlled ventricular rate  Review of Systems: Positive for: Weakness Negative for: Vision change, hearing change, syncope, dizziness, nausea, vomiting,diarrhea, bloody stool, stomach pain, cough, congestion, diaphoresis, urinary frequency, urinary pain,skin lesions, skin rashes Others previously listed  Objective: Telemetry: Atrial fibrillation with controlled ventricular rate Physical Exam: Blood pressure 132/73, pulse 79, temperature 97.9 F (36.6 C), temperature source Oral, resp. rate 19, height 5\' 2"  (1.575 m), weight 130 lb (59 kg), SpO2 95 %. Body mass index is 23.78 kg/m. General: Well developed, well nourished, in no acute distress. Head: Normocephalic, atraumatic, sclera non-icteric, no xanthomas, nares are without discharge. Neck: No apparent masses Lungs: Normal respirations with some wheezes, no rhonchi, no rales , few crackles   Heart: Irregular rate and rhythm, normal S1 S2, no murmur, no rub, no gallop, PMI is normal size and placement, carotid upstroke normal without bruit, jugular venous pressure normal Abdomen: Soft, non-tender, non-distended with normoactive bowel sounds. No hepatosplenomegaly. Abdominal aorta is normal size without bruit Extremities: Trace edema, no clubbing, no cyanosis, no ulcers,  Peripheral: 2+ radial, 2+ femoral, 2+ dorsal pedal pulses Neuro: Alert and oriented. Moves all extremities spontaneously. Psych:  Responds to questions appropriately with a normal affect.   Intake/Output Summary (Last 24 hours) at 03/12/2017 0849 Last data filed at 03/11/2017 2147 Gross per 24 hour  Intake 240 ml  Output 250 ml  Net -10 ml    Inpatient Medications:  . aspirin   325 mg Oral Daily  . azithromycin  250 mg Oral Daily  . citalopram  20 mg Oral Daily  . donepezil  10 mg Oral QHS  . furosemide  20 mg Oral Daily  . galantamine  4 mg Oral BID WC  . heparin  5,000 Units Subcutaneous Q8H  . ipratropium  0.5 mg Nebulization TID  . levalbuterol  1.25 mg Nebulization TID  . mouth rinse  15 mL Mouth Rinse BID  . methylPREDNISolone (SOLU-MEDROL) injection  40 mg Intravenous TID  . metoprolol succinate  50 mg Oral Daily  . mometasone-formoterol  2 puff Inhalation BID  . pantoprazole  40 mg Oral Daily  . valACYclovir  500 mg Oral BID   Infusions:   Labs: Recent Labs    03/10/17 0356  NA 136  K 3.8  CL 107  CO2 21*  GLUCOSE 120*  BUN 44*  CREATININE 1.10*  CALCIUM 8.4*   No results for input(s): AST, ALT, ALKPHOS, BILITOT, PROT, ALBUMIN in the last 72 hours. Recent Labs    03/10/17 0356  WBC 17.8*  HGB 11.5*  HCT 35.0  MCV 97.1  PLT 84*   Recent Labs    03/10/17 1116 03/10/17 2002 03/11/17 0144  TROPONINI 0.10* 0.09* 0.07*   Invalid input(s): POCBNP No results for input(s): HGBA1C in the last 72 hours.   Weights: Filed Weights   03/06/17 1921 03/06/17 2144  Weight: 130 lb (59 kg) 130 lb (59 kg)     Radiology/Studies:  Dg Chest Port 1 View  Result Date: 03/09/2017 CLINICAL DATA:  Cough and shortness of breath. EXAM: PORTABLE CHEST 1 VIEW COMPARISON:  March 06, 2017 FINDINGS: The heart, hila, and mediastinum are unchanged. Mild pulmonary venous congestion. Patchy infiltrate developing in the right base. Healed rib  fractures on the left. No other acute abnormalities. IMPRESSION: 1. Patchy infiltrate developing in the right base. Mild pulmonary venous congestion. Recommend follow-up to resolution. Electronically Signed   By: Gerome Sam III M.D   On: 03/09/2017 12:41   Dg Chest Port 1 View  Result Date: 03/06/2017 CLINICAL DATA:  Fever weakness today.  Cough. EXAM: PORTABLE CHEST 1 VIEW COMPARISON:  07/10/2010 FINDINGS:  Lungs are adequately inflated as patient is rotated to the left. There is hazy perihilar opacification likely due to mild asymmetric interstitial edema right greater than left. Right infrahilar infection is possible. No effusion. Mild cardiomegaly. Calcified plaque over the thoracic aorta. Degenerative change of the spine. Multiple old left posterior rib fractures. IMPRESSION: Hazy bilateral perihilar opacification right greater than left likely asymmetric edema, although infection in the right infrahilar region is possible. Cardiomegaly. Electronically Signed   By: Elberta Fortis M.D.   On: 03/06/2017 20:18     Assessment and Recommendation  84 y.o. female with acute on chronic diastolic dysfunction heart failure with elevated BNP chronic kidney disease stage III with elevated troponin consistent with demand ischemia rather than acute coronary syndrome and chronic atrial fibrillation with controlled ventricular rate 1.  Continue supportive care for possible infection 2.  Metoprolol for heart rate control of atrial fibrillation without change today 3.  Furosemide orally for congestive heart failure 4.  No anticoagulation due to advanced age fall issues as well as concerns of dementia 5.  No further cardiac diagnostics necessary at this time  Signed, Arnoldo Hooker M.D. FACC

## 2017-03-12 NOTE — Clinical Social Work Placement (Signed)
   CLINICAL SOCIAL WORK PLACEMENT  NOTE  Date:  03/12/2017  Patient Details  Name: Rita Vang MRN: [id number] Date of Birth: 06/09/1924  Clinical Social Work is seeking post-discharge placement for this patient at the Skilled  Nursing Facility level of care (*CSW will initial, date and re-position this form in  chart as items are completed):  Yes   Patient/family provided with [facility] Social Work Department's list of facilities offering this level of care within the geographic area requested by the patient (or if unable, by the patient's family).  Yes   Patient/family informed of their freedom to choose among providers that offer the needed level of care, that participate in Medicare, Medicaid or managed care program needed by the patient, have an available bed and are willing to accept the patient.  Yes   Patient/family informed of [facility]'s ownership interest in [facility] and [facility], as well as of the fact that they are under no obligation to receive care at these facilities.  PASRR submitted to EDS on       PASRR number received on       Existing PASRR number confirmed on 03/10/17     FL2 transmitted to all facilities in geographic area requested by pt/family on 03/10/17     FL2 transmitted to all facilities within larger geographic area on       Patient informed that his/her managed care company has contracts with or will negotiate with certain facilities, including the following:        Yes   Patient/family informed of bed offers received.  Patient [facility] bed at [facility]     Physician recommends and patient chooses bed at [facility]    Patient to be transferred [facility] on 03/12/17.  Patient [facility] be transferred to facility [facility]     Patient family notified on 03/12/17 of transfer.  Name of family member notified:  (Patient's son [name] is aware of D/C today. )     PHYSICIAN       Additional  Comment:    _______________________________________________ Melainie Krinsky, Darleen CrockerBailey M, LCSW 03/12/2017, 12:56 PM

## 2017-03-12 NOTE — Discharge Summary (Signed)
Rita Vang NAME: Rita Vang    MR#:  [id number]  DATE OF BIRTH:  [birth year]  DATE OF ADMISSION:  03/06/2017 ADMITTING PHYSICIAN: Lance Coon, MD  DATE OF DISCHARGE:  03/12/17   PRIMARY CARE PHYSICIAN: Leonel Ramsay, MD    ADMISSION DIAGNOSIS:  Hypoxia [R09.02] Sepsis, due to unspecified organism [facility]) [A41.9] Multifocal pneumonia [J18.9]  DISCHARGE DIAGNOSIS:   Sepsis from community-acquired pneumonia and acute bronchitis with coronavirus Chest pain with pleural effusion SECONDARY DIAGNOSIS:   Past Medical History:  Diagnosis Date  . Anxiety   . CVA (cerebral vascular accident) ([place])   . Dementia   . Depression   . GERD (gastroesophageal reflux disease)   . HLD (hyperlipidemia)   . HTN (hypertension)     HOSPITAL COURSE:  hpi  Rita Vang  is a 84 y.o. female who presents with a week of progressive mild symptoms, mostly wheezing, followed by significant decline today including shortness of breath, confusion, cough.  In the ED she was found to have pneumonia, she met sepsis criteria, and hospitalist were called for admission    Chest pain with elevated troponin Resolved Monitored patient on telemetry Twelve-lead EKG with no acute ST-T wave changes but in A. Fib Continue aspirin, beta-blocker metoprolol and statin Continue oxygen 2 L, wean off as tolerated via nasal cannula Cardiology consulted, seen by Dr. Nehemiah Massed, okay to discharge patient from cardiology standpoint Recommending furosemide for mild pulmonary edema with close monitoring of the renal function Echocardiogram-60-65% ejection fraction  cardiac biomarkers 0.1-0.09-0.07   Sepsis (Rita Vang) -secondary to community-acquired pneumonia  Patient met septic criteria with hypotension and tachycardia at the time of admission  IV antibiotics given, lactic acid within normal limit  blood cultures and urine cultures are negative.  MRSA PCR is  negative  CAP (community acquired pneumonia) with acute bronchitis -IV antibiotics cefepime , azithromycin is added for atypical coverage  Flu negative Respiratory panel coronavirus positive, ID is recommending to stop cefepime and continue azithromycin for a total of 5 days.  Will discharge patient with azithromycin  supportive treatment PRN Urine for streptococcal antigen is negative and Legionella is pending Appreciate ID recommendations Solu-Medrol for bronchodilatation, bronchodilator treatment Vancomycin discontinued as MRSA PCR is negative  repeat chest x-ray-pneumonia is not getting worse Hold Seroquel  Atrial fibrillation with RVR-from underlying sepsis IV Lopressor as needed On metoprolol and aspirin  HTN (hypertension) -blood pressure is better Resume home medication metoprolol succinate 50  mg  Anxiety -home dose anxiolytics  Chronic history of dementia continue Aricept and galantamine  GERD (gastroesophageal reflux disease) -homedose PPI  CKD (chronic kidney disease), stage III ([place]) -at baseline, avoid nephrotoxins and monitor   Generalized weakness PT evaluation recommending skilled nursing facility.  Son is agreeable.  Discussed to [facility]   DISCHARGE CONDITIONS:   STABLE   CONSULTS OBTAINED:  Treatment Team:  Leonel Ramsay, MD Corey Skains, MD   PROCEDURES NONE  DRUG ALLERGIES:   Allergies  Allergen Reactions  . Oxycontin [Oxycodone Hcl] Other (See Comments)    Hallucinations     DISCHARGE MEDICATIONS:   Allergies as of 03/12/2017      Reactions   Oxycontin [oxycodone Hcl] Other (See Comments)   Hallucinations      Medication List    STOP taking these medications   QUEtiapine 25 MG tablet Commonly known as:  SEROQUEL   traMADol 50 MG tablet Commonly known as:  Veatrice Bourbon  TAKE these medications   alum & mag hydroxide-simeth 200-200-20 MG/5ML suspension Commonly known as:   MAALOX/MYLANTA Take 30 mLs by mouth every 6 (six) hours as needed for indigestion or heartburn.   aspirin 325 MG tablet Take 1 tablet (325 mg total) by mouth daily.   azithromycin 250 MG tablet Commonly known as:  ZITHROMAX Take 1 tablet by mouth once daily for 4 days Start taking on:  03/13/2017   cetirizine 10 MG tablet Commonly known as:  ZYRTEC Take 10 mg by mouth as needed for allergies.   cholecalciferol 1000 units tablet Commonly known as:  VITAMIN D Take 1,000 Units by mouth daily.   citalopram 20 MG tablet Commonly known as:  CELEXA Take 1 tablet by mouth daily.   donepezil 10 MG tablet Commonly known as:  ARICEPT Take 1 tablet by mouth at bedtime.   fluticasone 50 MCG/ACT nasal spray Commonly known as:  FLONASE Place 1 spray into both nostrils daily.   Fluticasone-Salmeterol 100-50 MCG/DOSE Aepb Commonly known as:  ADVAIR Inhale 1 puff into the lungs 2 (two) times daily.   furosemide 20 MG tablet Commonly known as:  LASIX Take 1 tablet (20 mg total) by mouth daily. Start taking on:  03/13/2017   galantamine 4 MG tablet Commonly known as:  RAZADYNE Take 4 mg by mouth 2 (two) times daily with a meal.   GLUCOSAMINE 1500 COMPLEX PO Take 1 capsule by mouth daily.   glycopyrrolate 1 MG tablet Commonly known as:  ROBINUL Take 1 mg by mouth daily.   guaiFENesin-dextromethorphan 100-10 MG/5ML syrup Commonly known as:  ROBITUSSIN DM Take 5 mLs by mouth every 4 (four) hours as needed for cough.   I-VITE PROTECT PO Take 1 tablet by mouth daily.   ipratropium 0.02 % nebulizer solution Commonly known as:  ATROVENT Take 2.5 mLs (0.5 mg total) by nebulization 3 (three) times daily.   levalbuterol 1.25 MG/0.5ML nebulizer solution Commonly known as:  XOPENEX Take 1.25 mg by nebulization every 4 (four) hours as needed for wheezing or shortness of breath.   levalbuterol 1.25 MG/0.5ML nebulizer solution Commonly known as:  XOPENEX Take 1.25 mg by nebulization 3  (three) times daily.   meclizine 25 MG tablet Commonly known as:  ANTIVERT Take 1 tablet by mouth as needed.   metoprolol succinate 50 MG 24 hr tablet Commonly known as:  TOPROL-XL Take 1 tablet by mouth daily.   MIRALAX packet Generic drug:  polyethylene glycol Take 17 g by mouth as needed.   predniSONE 10 MG tablet Commonly known as:  DELTASONE Take 1 tablet (10 mg total) by mouth daily. 10 mg twice a day for 2 days followed by 10 mg once a day for 2 days followed by 10 mg every other day for 4 days and stop   simvastatin 20 MG tablet Commonly known as:  ZOCOR Take 20 mg by mouth every evening.   valACYclovir 500 MG tablet Commonly known as:  VALTREX Take 1 tablet (500 mg total) by mouth 2 (two) times daily.        DISCHARGE INSTRUCTIONS:   Continue 2 L of oxygen and wean off as tolerated Follow-up with primary care physician Dr. Ola Spurr in 1 week Follow-up with primary care physician at the facility in 2-3 days  DIET:  Cardiac diet  DISCHARGE CONDITION:  Stable  ACTIVITY:  Activity as tolerated  OXYGEN:  Home Oxygen: Yes.     Oxygen Delivery: 2 liters/min via Patient connected to nasal cannula oxygen  DISCHARGE LOCATION:  nursing home   If you experience worsening of your admission symptoms, develop shortness of breath, life threatening emergency, suicidal or homicidal thoughts you must seek medical attention immediately by calling 911 or calling your MD immediately  if symptoms less severe.  You Must read complete instructions/literature along with all the possible adverse reactions/side effects for all the Medicines you take and that have been prescribed to you. Take any new Medicines after you have completely understood and accpet all the possible adverse reactions/side effects.   Please note  You were cared for by a hospitalist during your hospital stay. If you have any questions about your discharge medications or the care you received while you  were in the hospital after you are discharged, you can call the unit and asked to speak with the hospitalist on call if the hospitalist that took care of you is not available. Once you are discharged, your primary care physician will handle any further medical issues. Please note that NO REFILLS for any discharge medications will be authorized once you are discharged, as it is imperative that you return to your primary care physician (or establish a relationship with a primary care physician if you do not have one) for your aftercare needs so that they can reassess your need for medications and monitor your lab values.     Today  Chief Complaint  Patient presents with  . Fever  . Cough    [name] demented and pleasantly confused.  Caregiver at bedside.  Discussed with son Nicki Reaper over phone agreeable to discharge patient today to nursing home  ROS: Unobtainable as the patient is pleasantly confused from underlying dementia CONSTITUTIONAL: Denies fevers, chills. Denies any fatigue, weakness.    VITAL SIGNS:  Blood pressure 132/73, pulse 79, temperature 97.9 F (36.6 C), temperature source Oral, resp. rate 19, height 5' 2"  (1.575 m), weight 59 kg (130 lb), SpO2 95 %.  I/O:    Intake/Output Summary (Last 24 hours) at 03/12/2017 1231 Last data filed at 03/11/2017 2147 Gross per 24 hour  Intake 240 ml  Output -  Net 240 ml    PHYSICAL EXAMINATION:  GENERAL:  84 y.o.-year-old patient lying in the bed with no acute distress.  EYES: Pupils equal, round, reactive to light and accommodation. No scleral icterus. Extraocular muscles intact.  HEENT: Head atraumatic, normocephalic. Oropharynx and nasopharynx clear.  NECK:  Supple, no jugular venous distention. No thyroid enlargement, no tenderness.  LUNGS: Normal breath sounds bilaterally, no wheezing, rales,rhonchi or crepitation. No use of accessory muscles of respiration.  CARDIOVASCULAR: S1, S2 normal. No murmurs, rubs, or gallops.   ABDOMEN: Soft, non-tender, non-distended. Bowel sounds present.  EXTREMITIES: No pedal edema, cyanosis, or clubbing.  NEUROLOGIC: Awake and alert and oriented x1 sensation intact. Gait not checked.  PSYCHIATRIC: The patient is alert and oriented x 3.  SKIN: No obvious rash, lesion, or ulcer.   DATA REVIEW:   CBC Recent Labs  Lab 03/10/17 0356  WBC 17.8*  HGB 11.5*  HCT 35.0  PLT 84*    Chemistries  Recent Labs  Lab 03/06/17 1928  03/10/17 0356  NA 137   < > 136  K 3.8   < > 3.8  CL 104   < > 107  CO2 22   < > 21*  GLUCOSE 115*   < > 120*  BUN 35*   < > 44*  CREATININE 1.26*   < > 1.10*  CALCIUM 8.9   < >  8.4*  AST 37  --   --   ALT 23  --   --   ALKPHOS 105  --   --   BILITOT 0.5  --   --    < > = values in this interval not displayed.    Cardiac Enzymes Recent Labs  Lab 03/11/17 0144  TROPONINI 0.07*    Microbiology Results  Results for orders placed or performed during the hospital encounter of 03/06/17  Urine Culture     Status: None   Collection Time: 03/06/17  7:25 PM  Result Value Ref Range Status   Specimen Description   Final    URINE, RANDOM Performed at [facility], [address] [place], [place] [address]    Special Requests   Final    NONE Performed at [facility], [address] [place], [place] [address]    Culture   Final    NO GROWTH Performed at [facility], [place] [address] [place], [place] [address]    Report Status 03/08/2017 FINAL  Final  Blood Culture (routine x 2)     Status: None   Collection Time: 03/06/17  7:29 PM  Result Value Ref Range Status   Specimen Description BLOOD BLOOD LEFT FOREARM  Final   Special Requests   Final    BOTTLES DRAWN AEROBIC AND ANAEROBIC Blood Culture adequate volume   Culture   Final    NO GROWTH 5 DAYS Performed at [facility], [address] [place], [place] [address]    Report Status 03/11/2017 FINAL  Final  Blood Culture (routine x 2)      Status: None   Collection Time: 03/06/17  7:29 PM  Result Value Ref Range Status   Specimen Description BLOOD BLOOD RIGHT WRIST  Final   Special Requests   Final    BOTTLES DRAWN AEROBIC AND ANAEROBIC Blood Culture adequate volume   Culture   Final    NO GROWTH 5 DAYS Performed at [facility], [place] [place], [place] [address]    Report Status 03/11/2017 FINAL  Final  MRSA PCR Screening     Status: None   Collection Time: 03/06/17 10:49 PM  Result Value Ref Range Status   MRSA by PCR NEGATIVE NEGATIVE Final    Comment:        The GeneXpert MRSA Assay (FDA approved for NASAL specimens only), is one component of a comprehensive MRSA colonization surveillance program. It is not intended to diagnose MRSA infection nor to guide or monitor treatment for MRSA infections. Performed at [facility], [address] [place], [place] [address]   Respiratory Panel by PCR     Status: Abnormal   Collection Time: 03/10/17  4:01 PM  Result Value Ref Range Status   Adenovirus NOT DETECTED NOT DETECTED Final   Coronavirus 229E NOT DETECTED NOT DETECTED Final   Coronavirus HKU1 NOT DETECTED NOT DETECTED Final   Coronavirus NL63 NOT DETECTED NOT DETECTED Final   Coronavirus OC43 DETECTED (A) NOT DETECTED Final   Metapneumovirus NOT DETECTED NOT DETECTED Final   Rhinovirus / Enterovirus NOT DETECTED NOT DETECTED Final   Influenza A NOT DETECTED NOT DETECTED Final   Influenza B NOT DETECTED NOT DETECTED Final   Parainfluenza Virus 1 NOT DETECTED NOT DETECTED Final   Parainfluenza Virus 2 NOT DETECTED NOT DETECTED Final   Parainfluenza Virus 3 NOT DETECTED NOT DETECTED Final   Parainfluenza Virus 4 NOT DETECTED NOT DETECTED  Final   Respiratory Syncytial Virus NOT DETECTED NOT DETECTED Final   Bordetella pertussis NOT DETECTED NOT DETECTED Final   Chlamydophila pneumoniae NOT DETECTED NOT DETECTED Final   Mycoplasma pneumoniae NOT DETECTED NOT DETECTED Final     Comment: Performed at [facility], [place] [address], [place], [place] [address]    RADIOLOGY:  Dg Chest Port 1 View  Result Date: 03/09/2017 CLINICAL DATA:  Cough and shortness of breath. EXAM: PORTABLE CHEST 1 VIEW COMPARISON:  March 06, 2017 FINDINGS: The heart, hila, and mediastinum are unchanged. Mild pulmonary venous congestion. Patchy infiltrate developing in the right base. Healed rib fractures on the left. No other acute abnormalities. IMPRESSION: 1. Patchy infiltrate developing in the right base. Mild pulmonary venous congestion. Recommend follow-up to resolution. Electronically Signed   By: Dorise Bullion III M.D   On: 03/09/2017 12:41    EKG:   Orders placed or performed during the hospital encounter of 03/06/17  . EKG 12-Lead  . EKG 12-Lead  . ED EKG 12-Lead  . ED EKG 12-Lead  . EKG 12-Lead  . EKG 12-Lead  . EKG 12-Lead  . EKG 12-Lead      Management plans discussed with the patient, family and they are in agreement.  CODE STATUS:     Code Status Orders  (From admission, onward)        Start     Ordered   03/06/17 2222  Full code  Continuous     03/06/17 2221    Code Status History    Date Active Date Inactive Code Status Order ID Comments User Context   08/23/2014 18:01 08/24/2014 18:10 Full Code [id number]  Bettey Costa, MD ED    Advance Directive Documentation     Most Recent Value  Type of Advance Directive  Healthcare Power of Attorney, Living will  Pre-existing out of facility DNR order (yellow form or pink MOST form)  No data  "MOST" Form in Place?  No data      TOTAL TIME TAKING CARE OF THIS PATIENT: 45  minutes.   Note: This dictation was prepared with Dragon dictation along with smaller phrase technology. Any transcriptional errors that result from this process are unintentional.   @[facility] @  on 03/12/2017 at 12:31 PM  Between 7am to 6pm - Pager - [phone]  After 6pm go to [email or web address] - password [email or web address] [facility]  [place]  [facility]  Office  [phone]  CC: Primary care physician; Leonel Ramsay, MD

## 2017-03-14 ENCOUNTER — Encounter
Admission: RE | Admit: 2017-03-14 | Discharge: 2017-03-14 | Disposition: A | Discharge status: Home / Self Care | Payer: Medicare Other | Source: Ambulatory Visit | Attending: Internal Medicine | Admitting: Internal Medicine

## 2017-03-19 ENCOUNTER — Other Ambulatory Visit: Payer: Self-pay

## 2017-03-19 MED ORDER — TRAMADOL HCL 50 MG PO TABS: 50.0000 mg | ORAL_TABLET | Freq: Two times a day (BID) | ORAL | 0 refills | Status: DC

## 2017-03-19 NOTE — Telephone Encounter (Signed)
Rx sent to [facility] phone : [phone] , fax : [phone]  

## 2017-03-21 ENCOUNTER — Non-Acute Institutional Stay (SKILLED_NURSING_FACILITY): Payer: Medicare Other | Admitting: Gerontology

## 2017-03-21 ENCOUNTER — Encounter: Payer: Self-pay | Admitting: Gerontology

## 2017-03-21 DIAGNOSIS — R531 Weakness: Secondary | ICD-10-CM | POA: Diagnosis not present

## 2017-03-21 DIAGNOSIS — J189 Pneumonia, unspecified organism: Secondary | ICD-10-CM

## 2017-03-21 NOTE — Progress Notes (Signed)
Location:   [facility] Room Number: 210B Place of Service:  SNF [phone]) Provider:  Lorenso Quarry, NP-C  Mick Sell, MD  Patient Care Team: Mick Sell, MD as PCP - General (Infectious Diseases)  Extended Emergency Contact Information Primary Emergency Contact: Estrella Deeds Address: [address]          [place], [place] [address] Darden [place] of [place] Home Phone: [phone] Relation: Daughter Secondary Emergency Contact: Shedlock,Scott          [place], [place] [address] D[place] of [place] Home Phone: [phone] Relation: Son   Code Status:  FULL Goals of care: Advanced Directive information Advanced Directives 03/21/2017  Does Patient Have a Medical Advance Directive? Yes  Type of Estate agent of [place] Draft;Living will  Does patient want to make changes to medical advance directive? -  Copy of Healthcare Power of Attorney in Chart? Yes     Chief Complaint  Patient presents with  . Medical Management of Chronic Issues    Routine Visit    HPI:  Pt is a 84 y.o. female seen today for medical management of chronic diseases. Pt was admitted to the facility for rehab for generalized weakness and deconditioning after hospitalization for Pneumonia and Sepsis. Pt is ambulatory with walker. She denies pain. Denies chest pains or shortness of breath. Pt reports her appetite is good and is voiding well and having regular BMs. No edema. No other complaints. VSS,       Past Medical History:  Diagnosis Date  . Anxiety   . Anxiety disorder    unspecified  . Cerebral infarction (HCC)   . CVA (cerebral vascular accident) (HCC)   . Dementia   . Depression   . GERD (gastroesophageal reflux disease)   . History of recurrent UTIs   . HLD (hyperlipidemia)   . HTN (hypertension)   . Vertigo    syncope   Past Surgical History:  Procedure Laterality Date  . CARPAL TUNNEL RELEASE Right 1995   endoscopic   . CARPAL  TUNNEL RELEASE Left 04/30/2013   endoscopic , Dr. Rosita Kea   . CATARACT EXTRACTION Right   . CHOLECYSTECTOMY    . ELBOW SURGERY Left   . ERCP W/ SPHINCTEROTOMY AND BALLOON DILATION  2008   and endoscopy  . ORIF DISTAL RADIUS FRACTURE Right 2002  . TOTAL KNEE ARTHROPLASTY Left 11/23/2007  . TRIGGER FINGER RELEASE  04/30/2013   third digit, incision tendon sheath for trigger finger    Allergies  Allergen Reactions  . Clarithromycin     Other reaction(s): Abdominal Pain  . Oxycontin [Oxycodone Hcl] Other (See Comments)    Hallucinations   . Sulfamethoxazole-Trimethoprim     Other reaction(s): Other (See Comments) Chest pain    Allergies as of 03/21/2017      Reactions   Clarithromycin    Other reaction(s): Abdominal Pain   Oxycontin [oxycodone Hcl] Other (See Comments)   Hallucinations   Sulfamethoxazole-trimethoprim    Other reaction(s): Other (See Comments) Chest pain      Medication List        Accurate as of 03/21/17  3:20 PM. Always use your most recent med list.          alum & mag hydroxide-simeth 200-200-20 MG/5ML suspension Commonly known as:  MAALOX/MYLANTA Take 30 mLs by mouth every 6 (six) hours as needed for indigestion or heartburn.   aspirin 325 MG tablet Take 1 tablet (325 mg total) by mouth daily.  cholecalciferol 1000 units tablet Commonly known as:  VITAMIN D Take 1,000 Units by mouth daily.   citalopram 20 MG tablet Commonly known as:  CELEXA Take 1 tablet by mouth daily.   donepezil 10 MG tablet Commonly known as:  ARICEPT Take 1 tablet by mouth at bedtime.   fluticasone 50 MCG/ACT nasal spray Commonly known as:  FLONASE Place 1 spray into both nostrils daily.   Fluticasone-Salmeterol 100-50 MCG/DOSE Aepb Commonly known as:  ADVAIR Inhale 1 puff into the lungs 2 (two) times daily.   furosemide 20 MG tablet Commonly known as:  LASIX Take 1 tablet (20 mg total) by mouth daily.   galantamine 4 MG tablet Commonly known as:   RAZADYNE Take 4 mg by mouth 2 (two) times daily with a meal.   GLUCOSAMINE 1500 COMPLEX PO Take 1 capsule by mouth daily.   glycopyrrolate 1 MG tablet Commonly known as:  ROBINUL Take 1 mg by mouth daily.   guaiFENesin-dextromethorphan 100-10 MG/5ML syrup Commonly known as:  ROBITUSSIN DM Take 5 mLs by mouth every 4 (four) hours as needed for cough.   I-VITE PROTECT PO Take 1 tablet by mouth daily.   ipratropium 0.02 % nebulizer solution Commonly known as:  ATROVENT Take 2.5 mLs (0.5 mg total) by nebulization 3 (three) times daily.   levalbuterol 1.25 MG/0.5ML nebulizer solution Commonly known as:  XOPENEX Take 1.25 mg by nebulization every 4 (four) hours as needed for wheezing or shortness of breath.   levalbuterol 1.25 MG/0.5ML nebulizer solution Commonly known as:  XOPENEX Take 1.25 mg by nebulization 3 (three) times daily.   meclizine 25 MG tablet Commonly known as:  ANTIVERT Take 1 tablet by mouth as needed for dizziness.   metoprolol succinate 50 MG 24 hr tablet Commonly known as:  TOPROL-XL Take 1 tablet by mouth daily.   MIRALAX packet Generic drug:  polyethylene glycol Take 17 g by mouth as needed.   predniSONE 10 MG tablet Commonly known as:  DELTASONE Take 1 tablet (10 mg total) by mouth daily. 10 mg twice a day for 2 days followed by 10 mg once a day for 2 days followed by 10 mg every other day for 4 days and stop   QUEtiapine 25 MG tablet Commonly known as:  SEROQUEL Take 25 mg by mouth at bedtime.   sennosides-docusate sodium 8.6-50 MG tablet Commonly known as:  SENOKOT-S Take 1 tablet by mouth 2 (two) times daily.   simvastatin 20 MG tablet Commonly known as:  ZOCOR Take 20 mg by mouth at bedtime.   sodium chloride 0.65 % nasal spray Commonly known as:  OCEAN Place 2 sprays into the nose as needed. Into each nostril for congestion, dry sinuses, irritation. May keep at bedside.   traMADol 50 MG tablet Commonly known as:  ULTRAM Take by  mouth 2 (two) times daily. pain. Hold for sedation   traMADol 50 MG tablet Commonly known as:  ULTRAM Take 50 mg by mouth every 4 (four) hours as needed.       Review of Systems  Constitutional: Negative for activity change, appetite change, chills, diaphoresis and fever.  HENT: Negative for congestion, mouth sores, nosebleeds, postnasal drip, sneezing, sore throat, trouble swallowing and voice change.   Respiratory: Negative for apnea, cough, choking, chest tightness, shortness of breath and wheezing.   Cardiovascular: Negative for chest pain, palpitations and leg swelling.  Gastrointestinal: Negative for abdominal distention, abdominal pain, constipation, diarrhea and nausea.  Genitourinary: Negative for difficulty urinating, dysuria, frequency  and urgency.  Musculoskeletal: Positive for arthralgias (typical arthritis). Negative for back pain, gait problem and myalgias.  Skin: Negative for color change, pallor, rash and wound.  Neurological: Positive for weakness. Negative for dizziness, tremors, syncope, speech difficulty, numbness and headaches.  Psychiatric/Behavioral: Negative for agitation and behavioral problems.  All other systems reviewed and are negative.   Immunization History  Administered Date(s) Administered  . Influenza-Unspecified 10/23/2012, 12/07/2013   Pertinent  Health Maintenance Due  Topic Date Due  . DEXA SCAN  11/20/1991  . PNA vac Low Risk Adult (1 of 2 - PCV13) 11/20/1991  . INFLUENZA VACCINE  08/14/2016   No flowsheet data found. Functional Status Survey:    Vitals:   03/21/17 1417  BP: 110/62  Pulse: 89  Resp: 20  Temp: 98 F (36.7 C)  TempSrc: Oral  SpO2: 97%  Weight: 112 lb 14.4 oz (51.2 kg)  Height: 5\' 2"  (1.575 m)   Body mass index is 20.65 kg/m. Physical Exam  Constitutional: She is oriented to person, place, and time. Vital signs are normal. She appears well-developed and well-nourished. She is active and cooperative. She does not  appear ill. No distress.  HENT:  Head: Normocephalic and atraumatic.  Mouth/Throat: Uvula is midline, oropharynx is clear and moist and mucous membranes are normal. Mucous membranes are not pale, not dry and not cyanotic.  Eyes: Conjunctivae, EOM and lids are normal. Pupils are equal, round, and reactive to light.  Neck: Trachea normal, normal range of motion and full passive range of motion without pain. Neck supple. No JVD present. No tracheal deviation, no edema and no erythema present. No thyromegaly present.  Cardiovascular: Normal rate, regular rhythm, normal heart sounds, intact distal pulses and normal pulses. Exam reveals no gallop, no distant heart sounds and no friction rub.  No murmur heard. Pulses:      Dorsalis pedis pulses are 2+ on the right side, and 2+ on the left side.  No edema  Pulmonary/Chest: Effort normal and breath sounds normal. No accessory muscle usage. No respiratory distress. She has no decreased breath sounds. She has no wheezes. She has no rhonchi. She has no rales. She exhibits no tenderness.  Abdominal: Soft. Normal appearance and bowel sounds are normal. She exhibits no distension and no ascites. There is no tenderness.  Musculoskeletal: Normal range of motion. She exhibits no edema or tenderness.  Expected osteoarthritis, stiffness; Bilateral Calves soft, supple. Negative Homan's Sign. B- pedal pulses equal; generalized weakness and deconditioning  Neurological: She is alert and oriented to person, place, and time. She has normal strength. Coordination and gait abnormal.  Skin: Skin is warm, dry and intact. She is not diaphoretic. No cyanosis. No pallor. Nails show no clubbing.  Psychiatric: She has a normal mood and affect. Her speech is normal and behavior is normal. Judgment and thought content normal. Cognition and memory are normal.  Nursing note and vitals reviewed.   Labs reviewed: Recent Labs    03/07/17 0400 03/08/17 0412 03/10/17 0356  NA 139  141 136  K 3.4* 3.4* 3.8  CL 110 111 107  CO2 17* 22 21*  GLUCOSE 125* 134* 120*  BUN 30* 31* 44*  CREATININE 1.29* 1.08* 1.10*  CALCIUM 8.1* 8.4* 8.4*   Recent Labs    03/06/17 1928  AST 37  ALT 23  ALKPHOS 105  BILITOT 0.5  PROT 6.6  ALBUMIN 3.5   Recent Labs    03/06/17 1928 03/07/17 0400 03/08/17 0412 03/10/17 0356  WBC 17.2*  19.8* 19.7* 17.8*  NEUTROABS 15.8*  --   --   --   HGB 13.3 12.4 11.4* 11.5*  HCT 39.7 37.4 34.9* 35.0  MCV 97.1 98.2 98.1 97.1  PLT 111* 85* 78* 84*   No results found for: TSH No results found for: HGBA1C Lab Results  Component Value Date   CHOL 154 03/11/2017   HDL 34 (L) 03/11/2017   LDLCALC 79 03/11/2017   TRIG 203 (H) 03/11/2017   CHOLHDL 4.5 03/11/2017    Significant Diagnostic Results in last 30 days:  Dg Chest Port 1 View  Result Date: 03/09/2017 CLINICAL DATA:  Cough and shortness of breath. EXAM: PORTABLE CHEST 1 VIEW COMPARISON:  March 06, 2017 FINDINGS: The heart, hila, and mediastinum are unchanged. Mild pulmonary venous congestion. Patchy infiltrate developing in the right base. Healed rib fractures on the left. No other acute abnormalities. IMPRESSION: 1. Patchy infiltrate developing in the right base. Mild pulmonary venous congestion. Recommend follow-up to resolution. Electronically Signed   By: Gerome Sam III M.D   On: 03/09/2017 12:41   Dg Chest Port 1 View  Result Date: 03/06/2017 CLINICAL DATA:  Fever weakness today.  Cough. EXAM: PORTABLE CHEST 1 VIEW COMPARISON:  07/10/2010 FINDINGS: Lungs are adequately inflated as patient is rotated to the left. There is hazy perihilar opacification likely due to mild asymmetric interstitial edema right greater than left. Right infrahilar infection is possible. No effusion. Mild cardiomegaly. Calcified plaque over the thoracic aorta. Degenerative change of the spine. Multiple old left posterior rib fractures. IMPRESSION: Hazy bilateral perihilar opacification right greater  than left likely asymmetric edema, although infection in the right infrahilar region is possible. Cardiomegaly. Electronically Signed   By: Elberta Fortis M.D.   On: 03/06/2017 20:18    Assessment/Plan Community acquired pneumonia, unspecified laterality  Resolved   Continue Robitussin DM- 10 mL PO Q 4 hours prn, residual cough  Generalized weakness  Continue PT/OT  Continue exercises as taught by PT/OT  Continue to monitor pulse and O2 sats   Ambulate with walker  Assist with ADLs as appropriate  Follow up with PCP as instructed  Family/ staff Communication:   Total Time:  Documentation:  Face to Face:  Family/Phone:   Labs/tests ordered:    Medication list reviewed and assessed for continued appropriateness. Monthly medication orders reviewed and signed.  Brynda Rim, NP-C Geriatrics [facility] [phone] [address], [place] [address] Cell Phone (Mon-Fri 8am-5pm):  [phone] On Call:  [phone] & follow prompts after 5pm & weekends Office Phone:  [phone] Office Fax:  [phone]

## 2017-04-01 ENCOUNTER — Non-Acute Institutional Stay (SKILLED_NURSING_FACILITY): Payer: Medicare Other | Admitting: Gerontology

## 2017-04-01 DIAGNOSIS — R634 Abnormal weight loss: Secondary | ICD-10-CM | POA: Diagnosis not present

## 2017-04-01 DIAGNOSIS — J189 Pneumonia, unspecified organism: Secondary | ICD-10-CM

## 2017-04-01 DIAGNOSIS — R531 Weakness: Secondary | ICD-10-CM | POA: Diagnosis not present

## 2017-04-01 NOTE — Progress Notes (Signed)
Location:      Place of Service:  Nursing [phone]) Provider:  Toni Arthurs, NP-C  Leonel Ramsay, MD  Patient Care Team: Leonel Ramsay, MD as PCP - General (Infectious Diseases)  Extended Emergency Contact Information Primary Emergency Contact: [place], [place] [address] Johnnette Litter of [place] Phone: [phone] Mobile Phone: [phone] Relation: Son Secondary Emergency Contact: Janalee Dane Address: [address]          [place], [place] [address] Johnnette Litter of [place] Phone: [phone] Relation: Daughter  Code Status:  Full  Goals of care: Advanced Directive information Advanced Directives 03/21/2017  Does Patient Have a Medical Advance Directive? Yes  Type of Paramedic of [place];Living will  Does patient want to make changes to medical advance directive? -  Copy of [place] in Chart? Yes     Chief Complaint  Patient presents with  . Medical Management of Chronic Issues    HPI:  Pt is a 84 y.o. female seen today for medical management of chronic diseases. Pt was admitted to the facility for rehab for generalized weakness and deconditioning following hospitalization for sepsis r/t PNA. Pt has been participating in PT/OT. Pt is ambulatory with rolling walker. O2 sats stable on RA. Pt denies pain, chest pain, or shortness of breath. Pt has has some weight loss ~ 6 lbs in 10 days. Will obtain labs for assessment of nutritional status. Average meal intake documented is 50-75%. Pt reports her appetite is fair. She says she is voiding well and now having regular BMs. VSS. No other complaints.      Past Medical History:  Diagnosis Date  . Anxiety   . Anxiety disorder    unspecified  . Cerebral infarction ([place])   . CVA (cerebral vascular accident) ([place])   . Dementia   . Depression   . GERD (gastroesophageal reflux disease)   . History of recurrent UTIs   . HLD (hyperlipidemia)   .  HTN (hypertension)   . Vertigo    syncope   Past Surgical History:  Procedure Laterality Date  . CARPAL TUNNEL RELEASE Right 1995   endoscopic   . CARPAL TUNNEL RELEASE Left 04/30/2013   endoscopic , Dr. Rudene Christians   . CATARACT EXTRACTION Right   . CHOLECYSTECTOMY    . ELBOW SURGERY Left   . ERCP W/ SPHINCTEROTOMY AND BALLOON DILATION  2008   and endoscopy  . ORIF DISTAL RADIUS FRACTURE Right 2002  . TOTAL KNEE ARTHROPLASTY Left 11/23/2007  . TRIGGER FINGER RELEASE  04/30/2013   third digit, incision tendon sheath for trigger finger    Allergies  Allergen Reactions  . Clarithromycin     Other reaction(s): Abdominal Pain  . Oxycontin [Oxycodone Hcl] Other (See Comments)    Hallucinations   . Sulfamethoxazole-Trimethoprim     Other reaction(s): Other (See Comments) Chest pain    Allergies as of 04/01/2017      Reactions   Clarithromycin    Other reaction(s): Abdominal Pain   Oxycontin [oxycodone Hcl] Other (See Comments)   Hallucinations   Sulfamethoxazole-trimethoprim    Other reaction(s): Other (See Comments) Chest pain      Medication List        Accurate as of 04/01/17  6:07 PM. Always use your most recent med list.          alum & mag hydroxide-simeth 200-200-20 MG/5ML suspension Commonly known as:  MAALOX/MYLANTA Take  30 mLs by mouth every 6 (six) hours as needed for indigestion or heartburn.   aspirin 325 MG tablet Take 1 tablet (325 mg total) by mouth daily.   cholecalciferol 1000 units tablet Commonly known as:  VITAMIN D Take 1,000 Units by mouth daily.   citalopram 20 MG tablet Commonly known as:  CELEXA Take 1 tablet by mouth daily.   donepezil 10 MG tablet Commonly known as:  ARICEPT Take 1 tablet by mouth at bedtime.   fluticasone 50 MCG/ACT nasal spray Commonly known as:  FLONASE Place 1 spray into both nostrils daily.   Fluticasone-Salmeterol 100-50 MCG/DOSE Aepb Commonly known as:  ADVAIR Inhale 1 puff into the lungs 2 (two) times  daily.   furosemide 20 MG tablet Commonly known as:  LASIX Take 1 tablet (20 mg total) by mouth daily.   galantamine 4 MG tablet Commonly known as:  RAZADYNE Take 4 mg by mouth 2 (two) times daily with a meal.   GLUCOSAMINE 1500 COMPLEX PO Take 1 capsule by mouth daily.   glycopyrrolate 1 MG tablet Commonly known as:  ROBINUL Take 1 mg by mouth daily.   guaiFENesin-dextromethorphan 100-10 MG/5ML syrup Commonly known as:  ROBITUSSIN DM Take 5 mLs by mouth every 4 (four) hours as needed for cough.   I-VITE PROTECT PO Take 1 tablet by mouth daily.   ipratropium 0.02 % nebulizer solution Commonly known as:  ATROVENT Take 2.5 mLs (0.5 mg total) by nebulization 3 (three) times daily.   levalbuterol 1.25 MG/0.5ML nebulizer solution Commonly known as:  XOPENEX Take 1.25 mg by nebulization every 4 (four) hours as needed for wheezing or shortness of breath.   levalbuterol 1.25 MG/0.5ML nebulizer solution Commonly known as:  XOPENEX Take 1.25 mg by nebulization 3 (three) times daily.   meclizine 25 MG tablet Commonly known as:  ANTIVERT Take 1 tablet by mouth as needed for dizziness.   metoprolol succinate 50 MG 24 hr tablet Commonly known as:  TOPROL-XL Take 1 tablet by mouth daily.   MIRALAX packet Generic drug:  polyethylene glycol Take 17 g by mouth as needed.   predniSONE 10 MG tablet Commonly known as:  DELTASONE Take 1 tablet (10 mg total) by mouth daily. 10 mg twice a day for 2 days followed by 10 mg once a day for 2 days followed by 10 mg every other day for 4 days and stop   QUEtiapine 25 MG tablet Commonly known as:  SEROQUEL Take 25 mg by mouth at bedtime.   sennosides-docusate sodium 8.6-50 MG tablet Commonly known as:  SENOKOT-S Take 1 tablet by mouth 2 (two) times daily.   simvastatin 20 MG tablet Commonly known as:  ZOCOR Take 20 mg by mouth at bedtime.   sodium chloride 0.65 % nasal spray Commonly known as:  OCEAN Place 2 sprays into the nose as  needed. Into each nostril for congestion, dry sinuses, irritation. May keep at bedside.   traMADol 50 MG tablet Commonly known as:  ULTRAM Take by mouth 2 (two) times daily. pain. Hold for sedation   traMADol 50 MG tablet Commonly known as:  ULTRAM Take 50 mg by mouth every 4 (four) hours as needed.       Review of Systems  Constitutional: Negative for activity change, appetite change, chills, diaphoresis and fever.  HENT: Negative for congestion, mouth sores, nosebleeds, postnasal drip, sneezing, sore throat, trouble swallowing and voice change.   Respiratory: Negative for apnea, cough, choking, chest tightness, shortness of breath and wheezing.  Cardiovascular: Negative for chest pain, palpitations and leg swelling.  Gastrointestinal: Negative for abdominal distention, abdominal pain, constipation, diarrhea and nausea.  Genitourinary: Negative for difficulty urinating, dysuria, frequency and urgency.  Musculoskeletal: Positive for arthralgias (typical arthritis). Negative for back pain, gait problem and myalgias.  Skin: Negative for color change, pallor, rash and wound.  Neurological: Positive for weakness. Negative for dizziness, tremors, syncope, speech difficulty, numbness and headaches.  Psychiatric/Behavioral: Negative for agitation and behavioral problems.  All other systems reviewed and are negative.   Immunization History  Administered Date(s) Administered  . Influenza-Unspecified 10/23/2012, 12/07/2013   Pertinent  Health Maintenance Due  Topic Date Due  . DEXA SCAN  11/20/1991  . PNA vac Low Risk Adult (1 of 2 - PCV13) 11/20/1991  . INFLUENZA VACCINE  08/14/2016   No flowsheet data found. Functional Status Survey:    Vitals:   04/01/17 0445  BP: 135/69  Pulse: 72  Resp: 17  Temp: 97.9 F (36.6 C)  SpO2: 96%  Weight: 117 lb (53.1 kg)   Body mass index is 21.4 kg/m. Physical Exam  Constitutional: She is oriented to person, place, and time. Vital signs  are normal. She appears well-developed and well-nourished. She is active and cooperative. She does not appear ill. No distress.  HENT:  Head: Normocephalic and atraumatic.  Mouth/Throat: Uvula is midline, oropharynx is clear and moist and mucous membranes are normal. Mucous membranes are not pale, not dry and not cyanotic.  Eyes: Conjunctivae, EOM and lids are normal. Pupils are equal, round, and reactive to light.  Neck: Trachea normal, normal range of motion and full passive range of motion without pain. Neck supple. No JVD present. No tracheal deviation, no edema and no erythema present. No thyromegaly present.  Cardiovascular: Normal rate, regular rhythm, normal heart sounds, intact distal pulses and normal pulses. Exam reveals no gallop, no distant heart sounds and no friction rub.  No murmur heard. Pulses:      Dorsalis pedis pulses are 2+ on the right side, and 2+ on the left side.  No edema  Pulmonary/Chest: Effort normal and breath sounds normal. No accessory muscle usage. No respiratory distress. She has no decreased breath sounds. She has no wheezes. She has no rhonchi. She has no rales. She exhibits no tenderness.  Abdominal: Soft. Normal appearance and bowel sounds are normal. She exhibits no distension and no ascites. There is no tenderness.  Musculoskeletal: Normal range of motion. She exhibits no edema or tenderness.  Expected osteoarthritis, stiffness; Bilateral Calves soft, supple. Negative Homan's Sign. B- pedal pulses equal; generalized weakness  Neurological: She is alert and oriented to person, place, and time. She has normal strength. Coordination and gait abnormal.  Skin: Skin is warm, dry and intact. She is not diaphoretic. No cyanosis. No pallor. Nails show no clubbing.  Psychiatric: She has a normal mood and affect. Her speech is normal and behavior is normal. Judgment and thought content normal. Cognition and memory are normal.  Nursing note and vitals  reviewed.   Labs reviewed: Recent Labs    03/07/17 0400 03/08/17 0412 03/10/17 0356  NA 139 141 136  K 3.4* 3.4* 3.8  CL 110 111 107  CO2 17* 22 21*  GLUCOSE 125* 134* 120*  BUN 30* 31* 44*  CREATININE 1.29* 1.08* 1.10*  CALCIUM 8.1* 8.4* 8.4*   Recent Labs    03/06/17 1928  AST 37  ALT 23  ALKPHOS 105  BILITOT 0.5  PROT 6.6  ALBUMIN 3.5  Recent Labs    03/06/17 1928 03/07/17 0400 03/08/17 0412 03/10/17 0356  WBC 17.2* 19.8* 19.7* 17.8*  NEUTROABS 15.8*  --   --   --   HGB 13.3 12.4 11.4* 11.5*  HCT 39.7 37.4 34.9* 35.0  MCV 97.1 98.2 98.1 97.1  PLT 111* 85* 78* 84*   No results found for: TSH No results found for: HGBA1C Lab Results  Component Value Date   CHOL 154 03/11/2017   HDL 34 (L) 03/11/2017   LDLCALC 79 03/11/2017   TRIG 203 (H) 03/11/2017   CHOLHDL 4.5 03/11/2017    Significant Diagnostic Results in last 30 days:  Dg Chest Port 1 View  Result Date: 03/09/2017 CLINICAL DATA:  Cough and shortness of breath. EXAM: PORTABLE CHEST 1 VIEW COMPARISON:  March 06, 2017 FINDINGS: The heart, hila, and mediastinum are unchanged. Mild pulmonary venous congestion. Patchy infiltrate developing in the right base. Healed rib fractures on the left. No other acute abnormalities. IMPRESSION: 1. Patchy infiltrate developing in the right base. Mild pulmonary venous congestion. Recommend follow-up to resolution. Electronically Signed   By: Dorise Bullion III M.D   On: 03/09/2017 12:41   Dg Chest Port 1 View  Result Date: 03/06/2017 CLINICAL DATA:  Fever weakness today.  Cough. EXAM: PORTABLE CHEST 1 VIEW COMPARISON:  07/10/2010 FINDINGS: Lungs are adequately inflated as patient is rotated to the left. There is hazy perihilar opacification likely due to mild asymmetric interstitial edema right greater than left. Right infrahilar infection is possible. No effusion. Mild cardiomegaly. Calcified plaque over the thoracic aorta. Degenerative change of the spine. Multiple  old left posterior rib fractures. IMPRESSION: Hazy bilateral perihilar opacification right greater than left likely asymmetric edema, although infection in the right infrahilar region is possible. Cardiomegaly. Electronically Signed   By: Marin Olp M.D.   On: 03/06/2017 20:18    Assessment/Plan Rita Vang was seen today for medical management of chronic issues.  Diagnoses and all orders for this visit:  Community acquired pneumonia, unspecified laterality  Generalized weakness  Weight loss   labs in the AM  Consider supplements if appropriate  PNA- resolved  Continue PT/OT  Continue exercises as taught by PT/OT  Continue to assist with ADLs, etc as appropriate  Follow up with PCP as instructed  Family/ staff Communication:   Total Time:  Documentation:  Face to Face:  Family/Phone:   Labs/tests ordered: cbc, met c, mag+, tsh, B12, D, lipid panel  Medication list reviewed and assessed for continued appropriateness. Monthly medication orders reviewed and signed.  Vikki Ports, NP-C Geriatrics [facility] [phone], [place] [address] Cell Phone (Mon-Fri 8am-5pm):  [phone] On Call:  [phone] & follow prompts after 5pm & weekends Office Phone:  [phone] Office Fax:  [phone]

## 2017-04-02 ENCOUNTER — Other Ambulatory Visit
Admission: RE | Admit: 2017-04-02 | Discharge: 2017-04-02 | Disposition: A | Discharge status: Home / Self Care | Payer: No Typology Code available for payment source | Source: Skilled Nursing Facility | Attending: Gerontology | Admitting: Gerontology

## 2017-04-02 DIAGNOSIS — J188 Other pneumonia, unspecified organism: Secondary | ICD-10-CM | POA: Insufficient documentation

## 2017-04-02 DIAGNOSIS — R634 Abnormal weight loss: Secondary | ICD-10-CM | POA: Insufficient documentation

## 2017-04-02 DIAGNOSIS — R531 Weakness: Secondary | ICD-10-CM | POA: Insufficient documentation

## 2017-04-02 LAB — CBC WITH DIFFERENTIAL/PLATELET
BASOS PCT: 1 %
Basophils Absolute: 0.1 10*3/uL (ref 0–0.1)
EOS ABS: 0.2 10*3/uL (ref 0–0.7)
Eosinophils Relative: 1 %
HCT: 33.6 % — ABNORMAL LOW (ref 35.0–47.0)
Hemoglobin: 11.1 g/dL — ABNORMAL LOW (ref 12.0–16.0)
LYMPHS ABS: 1.2 10*3/uL (ref 1.0–3.6)
Lymphocytes Relative: 11 %
MCH: 32.4 pg (ref 26.0–34.0)
MCHC: 32.9 g/dL (ref 32.0–36.0)
MCV: 98.3 fL (ref 80.0–100.0)
MONO ABS: 0.7 10*3/uL (ref 0.2–0.9)
Monocytes Relative: 7 %
Neutro Abs: 8.8 10*3/uL — ABNORMAL HIGH (ref 1.4–6.5)
Neutrophils Relative %: 80 %
Platelets: 89 10*3/uL — ABNORMAL LOW (ref 150–440)
RBC: 3.42 MIL/uL — ABNORMAL LOW (ref 3.80–5.20)
RDW: 13.9 % (ref 11.5–14.5)
WBC: 10.9 10*3/uL (ref 3.6–11.0)

## 2017-04-02 LAB — COMPREHENSIVE METABOLIC PANEL
ALBUMIN: 2.5 g/dL — AB (ref 3.5–5.0)
ALK PHOS: 77 U/L (ref 38–126)
ALT: 16 U/L (ref 14–54)
AST: 25 U/L (ref 15–41)
Anion gap: 10 (ref 5–15)
BUN: 25 mg/dL — AB (ref 6–20)
CALCIUM: 8.1 mg/dL — AB (ref 8.9–10.3)
CO2: 28 mmol/L (ref 22–32)
CREATININE: 0.86 mg/dL (ref 0.44–1.00)
Chloride: 101 mmol/L (ref 101–111)
GFR calc Af Amer: >60 mL/min (ref >60)
GFR calc non Af Amer: 58 mL/min — ABNORMAL LOW (ref >60)
GLUCOSE: 88 mg/dL (ref 65–99)
Potassium: 2.9 mmol/L — ABNORMAL LOW (ref 3.5–5.1)
Sodium: 139 mmol/L (ref 135–145)
Total Bilirubin: 0.5 mg/dL (ref 0.3–1.2)
Total Protein: 5.4 g/dL — ABNORMAL LOW (ref 6.5–8.1)

## 2017-04-02 LAB — LIPID PANEL
CHOLESTEROL: 117 mg/dL (ref 0–200)
HDL: 37 mg/dL — ABNORMAL LOW (ref >40)
LDL CALC: 64 mg/dL (ref 0–99)
Total CHOL/HDL Ratio: 3.2 RATIO
Triglycerides: 81 mg/dL (ref <150)
VLDL: 16 mg/dL (ref 0–40)

## 2017-04-02 LAB — MAGNESIUM: Magnesium: 1.5 mg/dL — ABNORMAL LOW (ref 1.7–2.4)

## 2017-04-03 ENCOUNTER — Other Ambulatory Visit
Admission: RE | Admit: 2017-04-03 | Discharge: 2017-04-03 | Disposition: A | Discharge status: Home / Self Care | Payer: No Typology Code available for payment source | Source: Ambulatory Visit | Attending: Gerontology | Admitting: Gerontology

## 2017-04-03 DIAGNOSIS — E876 Hypokalemia: Secondary | ICD-10-CM | POA: Insufficient documentation

## 2017-04-03 LAB — BASIC METABOLIC PANEL
ANION GAP: 10 (ref 5–15)
BUN: 26 mg/dL — ABNORMAL HIGH (ref 6–20)
CO2: 28 mmol/L (ref 22–32)
CREATININE: 1.02 mg/dL — AB (ref 0.44–1.00)
Calcium: 8.6 mg/dL — ABNORMAL LOW (ref 8.9–10.3)
Chloride: 103 mmol/L (ref 101–111)
GFR calc Af Amer: 54 mL/min — ABNORMAL LOW (ref >60)
GFR calc non Af Amer: 47 mL/min — ABNORMAL LOW (ref >60)
GLUCOSE: 105 mg/dL — AB (ref 65–99)
Potassium: 4.2 mmol/L (ref 3.5–5.1)
Sodium: 141 mmol/L (ref 135–145)

## 2017-04-03 LAB — VITAMIN B12: Vitamin B-12: 288 pg/mL (ref 180–914)

## 2017-04-03 LAB — VITAMIN D 25 HYDROXY (VIT D DEFICIENCY, FRACTURES): VIT D 25 HYDROXY: 36.5 ng/mL (ref 30.0–100.0)

## 2017-04-04 ENCOUNTER — Non-Acute Institutional Stay (SKILLED_NURSING_FACILITY): Payer: Medicare Other | Admitting: Gerontology

## 2017-04-04 ENCOUNTER — Encounter: Payer: Self-pay | Admitting: Gerontology

## 2017-04-04 DIAGNOSIS — E538 Deficiency of other specified B group vitamins: Secondary | ICD-10-CM | POA: Diagnosis not present

## 2017-04-04 DIAGNOSIS — R918 Other nonspecific abnormal finding of lung field: Secondary | ICD-10-CM | POA: Diagnosis not present

## 2017-04-04 DIAGNOSIS — A6000 Herpesviral infection of urogenital system, unspecified: Secondary | ICD-10-CM | POA: Diagnosis not present

## 2017-04-04 DIAGNOSIS — R531 Weakness: Secondary | ICD-10-CM | POA: Diagnosis not present

## 2017-04-04 DIAGNOSIS — E46 Unspecified protein-calorie malnutrition: Secondary | ICD-10-CM | POA: Diagnosis not present

## 2017-04-04 LAB — VITAMIN D 25 HYDROXY (VIT D DEFICIENCY, FRACTURES): VIT D 25 HYDROXY: 36.6 ng/mL (ref 30.0–100.0)

## 2017-04-04 NOTE — Progress Notes (Signed)
Location:   [facility] Room Number: [place] of Service:  SNF [phone]) Provider:  Toni Arthurs, NP-C  Leonel Ramsay, MD  Patient Care Team: Leonel Ramsay, MD as PCP - General (Infectious Diseases)  Extended Emergency Contact Information Primary Emergency Contact: [place], [place] [address] Johnnette Litter of [place] Phone: [phone] Mobile Phone: [phone] Relation: Son Secondary Emergency Contact: Janalee Dane Address: [address]          [place], [place] [address] Johnnette Litter of [place] Phone: [phone] Relation: Daughter  Code Status: FULL Goals of care: Advanced Directive information Advanced Directives 04/04/2017  Does Patient Have a Medical Advance Directive? Yes  Type of Paramedic of [place];Living will  Does patient want to make changes to medical advance directive? -  Copy of [place] in Chart? Yes     Chief Complaint  Patient presents with  . Acute Visit    Check low oxygen saturation level    HPI:  Pt is a 84 y.o. female seen today for an acute visit for low oxygen saturation.  Patient was admitted to the facility for rehab for generalized weakness and deconditioning following hospitalization for community-acquired pneumonia.  Patient had been on oxygen.  Staff has been able to wean her off of the oxygen to just room air.  Patient has been participating in PT/OT.  Patient has had confusion and disorientation at times, but pleasantly confused.  Yesterday, patient was found to have oxygen saturation in the 70s.  Nursing applied oxygen at 2 L via nasal cannula.  Oxygen saturations returned to 94%.  Patient denies chest pain or shortness of breath.  Patient denies cough or congestion.  Labs were obtained to evaluate for anemia as well as other deficiencies leading to significant weight loss.  Patient was found to have protein calorie malnutrition with  hypomagnesemia and vitamin B12 deficiency.  Patient has been started on supplements for this.  Also, patient was found to be having a herpes outbreak on her bottom.  Multiple weeping lesions observed.  Patient's son reports that she has a history of herpes outbreaks and is prone to outbreaks.  Prior to admission, she was on Valtrex prophylaxis.  Will reinitiate prophylactic dose after completion of treatment dose.  Patient does not complain of pain or burning at the lesion sites.  Patient reports she is actually feeling better and is without complaints.  Please note pt with limited verbal ability. Unable to obtain complete ROS. Some ROS info obtained from staff and documentation.   Past Medical History:  Diagnosis Date  . Anxiety   . Anxiety disorder    unspecified  . Cerebral infarction ([place])   . CVA (cerebral vascular accident) ([place])   . Dementia   . Depression   . GERD (gastroesophageal reflux disease)   . History of recurrent UTIs   . HLD (hyperlipidemia)   . HTN (hypertension)   . Vertigo    syncope   Past Surgical History:  Procedure Laterality Date  . CARPAL TUNNEL RELEASE Right 1995   endoscopic   . CARPAL TUNNEL RELEASE Left 04/30/2013   endoscopic , Dr. Rudene Christians   . CATARACT EXTRACTION Right   . CHOLECYSTECTOMY    . ELBOW SURGERY Left   . ERCP W/ SPHINCTEROTOMY AND BALLOON DILATION  2008   and endoscopy  . ORIF DISTAL RADIUS FRACTURE Right 2002  . TOTAL KNEE  ARTHROPLASTY Left 11/23/2007  . TRIGGER FINGER RELEASE  04/30/2013   third digit, incision tendon sheath for trigger finger    Allergies  Allergen Reactions  . Clarithromycin     Other reaction(s): Abdominal Pain  . Oxycontin [Oxycodone Hcl] Other (See Comments)    Hallucinations   . Sulfamethoxazole-Trimethoprim     Other reaction(s): Other (See Comments) Chest pain    Allergies as of 04/04/2017      Reactions   Clarithromycin    Other reaction(s): Abdominal Pain   Oxycontin [oxycodone Hcl] Other (See  Comments)   Hallucinations   Sulfamethoxazole-trimethoprim    Other reaction(s): Other (See Comments) Chest pain      Medication List        Accurate as of 04/04/17 12:39 PM. Always use your most recent med list.          alum & mag hydroxide-simeth 200-200-20 MG/5ML suspension Commonly known as:  MAALOX/MYLANTA Take 30 mLs by mouth every 6 (six) hours as needed for indigestion or heartburn.   aspirin 325 MG tablet Take 1 tablet (325 mg total) by mouth daily.   bisacodyl 10 MG suppository Commonly known as:  DULCOLAX Place 10 mg rectally daily as needed.   cetirizine 10 MG tablet Commonly known as:  ZYRTEC Take 10 mg by mouth daily as needed for allergies.   cholecalciferol 1000 units tablet Commonly known as:  VITAMIN D Take 1,000 Units by mouth daily.   citalopram 20 MG tablet Commonly known as:  CELEXA Take 1 tablet by mouth daily.   cyanocobalamin 1000 MCG tablet Take 2,000 mcg by mouth daily. 2 tabs   donepezil 10 MG tablet Commonly known as:  ARICEPT Take 1 tablet by mouth at bedtime.   ENSURE ENLIVE PO Take 1 Bottle by mouth 2 (two) times daily between meals.   feeding supplement (PRO-STAT SUGAR FREE 64) Liqd Take 30 mLs by mouth 2 (two) times daily between meals.   fluticasone 50 MCG/ACT nasal spray Commonly known as:  FLONASE Place 1 spray into both nostrils daily.   Fluticasone-Salmeterol 100-50 MCG/DOSE Aepb Commonly known as:  ADVAIR Inhale 1 puff into the lungs 2 (two) times daily.   furosemide 20 MG tablet Commonly known as:  LASIX Take 1 tablet (20 mg total) by mouth daily.   galantamine 4 MG tablet Commonly known as:  RAZADYNE Take 4 mg by mouth 2 (two) times daily with a meal.   GLUCOSAMINE 1500 COMPLEX PO Take 1 capsule by mouth daily.   glycopyrrolate 1 MG tablet Commonly known as:  ROBINUL Take 1 mg by mouth daily.   guaiFENesin-dextromethorphan 100-10 MG/5ML syrup Commonly known as:  ROBITUSSIN DM Take 5 mLs by mouth  every 4 (four) hours as needed for cough.   I-VITE PROTECT PO Take 1 tablet by mouth daily.   ipratropium 0.02 % nebulizer solution Commonly known as:  ATROVENT Take 2.5 mLs (0.5 mg total) by nebulization 3 (three) times daily.   levalbuterol 1.25 MG/0.5ML nebulizer solution Commonly known as:  XOPENEX Take 1.25 mg by nebulization every 4 (four) hours as needed for wheezing or shortness of breath.   levalbuterol 1.25 MG/0.5ML nebulizer solution Commonly known as:  XOPENEX Take 1.25 mg by nebulization 3 (three) times daily.   MIRALAX packet Generic drug:  polyethylene glycol Take 17 g by mouth as needed.   QUEtiapine 25 MG tablet Commonly known as:  SEROQUEL Take 25 mg by mouth at bedtime.   sennosides-docusate sodium 8.6-50 MG tablet Commonly known as:  SENOKOT-S Take 1 tablet by mouth 2 (two) times daily.   sodium chloride 0.65 % nasal spray Commonly known as:  OCEAN Place 2 sprays into the nose as needed. Into each nostril for congestion, dry sinuses, irritation. May keep at bedside.   sodium phosphate 7-19 GM/118ML Enem Place 1 enema rectally every three (3) days as needed for severe constipation. Constipation not relieved by Milk of Magnesia or Bisacodyl Suppository.   sorbitol 70 % solution Take 30 mLs by mouth every 2 (two) hours as needed. Give 30 mL po Q 2 hours until large BM. Then may change order to prn- for constipation/ impaction   traMADol 50 MG tablet Commonly known as:  ULTRAM Take by mouth 2 (two) times daily. pain. Hold for sedation   traMADol 50 MG tablet Commonly known as:  ULTRAM Take 50 mg by mouth every 4 (four) hours as needed.       Review of Systems  Unable to perform ROS: Dementia  Constitutional: Negative for activity change, appetite change, chills, diaphoresis and fever.  HENT: Negative for congestion, mouth sores, nosebleeds, postnasal drip, sneezing, sore throat, trouble swallowing and voice change.   Respiratory: Negative for  apnea, cough, choking, chest tightness, shortness of breath and wheezing.   Cardiovascular: Negative for chest pain, palpitations and leg swelling.  Gastrointestinal: Negative for abdominal distention, abdominal pain, constipation, diarrhea and nausea.  Genitourinary: Negative for difficulty urinating, dysuria, frequency and urgency.  Musculoskeletal: Negative for back pain, gait problem and myalgias. Arthralgias: typical arthritis.  Skin: Negative for color change, pallor, rash and wound.  Neurological: Positive for weakness. Negative for dizziness, tremors, syncope, speech difficulty, numbness and headaches.  Psychiatric/Behavioral: Negative for agitation and behavioral problems.  All other systems reviewed and are negative.   Immunization History  Administered Date(s) Administered  . Influenza-Unspecified 10/23/2012, 12/07/2013   Pertinent  Health Maintenance Due  Topic Date Due  . DEXA SCAN  11/20/1991  . PNA vac Low Risk Adult (1 of 2 - PCV13) 11/20/1991  . INFLUENZA VACCINE  08/14/2016   No flowsheet data found. Functional Status Survey:    Vitals:   04/04/17 1138  BP: 120/66  Pulse: 68  Resp: 18  Temp: 98.6 F (37 C)  TempSrc: Oral  SpO2: 99%  Weight: 117 lb (53.1 kg)  Height: 5' 2"  (1.575 m)   Body mass index is 21.4 kg/m. Physical Exam  Constitutional: She is oriented to person, place, and time. Vital signs are normal. She appears well-developed and well-nourished. She is active and cooperative. She does not appear ill. No distress. Nasal cannula in place.  HENT:  Head: Normocephalic and atraumatic.  Mouth/Throat: Uvula is midline, oropharynx is clear and moist and mucous membranes are normal. Mucous membranes are not pale, not dry and not cyanotic.  Eyes: Pupils are equal, round, and reactive to light. Conjunctivae, EOM and lids are normal.  Neck: Trachea normal, normal range of motion and full passive range of motion without pain. Neck supple. No JVD present.  No tracheal deviation, no edema and no erythema present. No thyromegaly present.  Cardiovascular: Normal rate, intact distal pulses and normal pulses. An irregular rhythm present. Exam reveals gallop. Exam reveals no distant heart sounds and no friction rub.  No murmur heard. Pulses:      Dorsalis pedis pulses are 2+ on the right side, and 2+ on the left side.  No edema  Pulmonary/Chest: Effort normal. No accessory muscle usage. No respiratory distress. She has no decreased breath sounds. She  has no wheezes. She has no rhonchi. She has rales in the right lower field and the left lower field. She exhibits no tenderness.  Abdominal: Soft. Normal appearance and bowel sounds are normal. She exhibits no distension and no ascites. There is no tenderness.  Musculoskeletal: Normal range of motion. She exhibits no edema or tenderness.  Expected osteoarthritis, stiffness; Bilateral Calves soft, supple. Negative Homan's Sign. B- pedal pulses equal; generalized weakness  Neurological: She is alert and oriented to person, place, and time. She has normal strength. Coordination and gait abnormal.  Skin: Skin is warm, dry and intact. She is not diaphoretic. No cyanosis. No pallor. Nails show no clubbing.  Psychiatric: She has a normal mood and affect. Her speech is normal and behavior is normal. Judgment and thought content normal. Cognition and memory are impaired. She exhibits abnormal recent memory and abnormal remote memory.  Nursing note and vitals reviewed.   Labs reviewed: Recent Labs    03/10/17 0356 04/02/17 0623 04/03/17 1155  NA 136 139 141  K 3.8 2.9* 4.2  CL 107 101 103  CO2 21* 28 28  GLUCOSE 120* 88 105*  BUN 44* 25* 26*  CREATININE 1.10* 0.86 1.02*  CALCIUM 8.4* 8.1* 8.6*  MG  --  1.5*  --    Recent Labs    03/06/17 1928 04/02/17 0623  AST 37 25  ALT 23 16  ALKPHOS 105 77  BILITOT 0.5 0.5  PROT 6.6 5.4*  ALBUMIN 3.5 2.5*   Recent Labs    03/06/17 1928  03/08/17 0412  03/10/17 0356 04/02/17 0623  WBC 17.2*   < > 19.7* 17.8* 10.9  NEUTROABS 15.8*  --   --   --  8.8*  HGB 13.3   < > 11.4* 11.5* 11.1*  HCT 39.7   < > 34.9* 35.0 33.6*  MCV 97.1   < > 98.1 97.1 98.3  PLT 111*   < > 78* 84* 89*   < > = values in this interval not displayed.   No results found for: TSH No results found for: HGBA1C Lab Results  Component Value Date   CHOL 117 04/02/2017   HDL 37 (L) 04/02/2017   LDLCALC 64 04/02/2017   TRIG 81 04/02/2017   CHOLHDL 3.2 04/02/2017    Significant Diagnostic Results in last 30 days:  Dg Chest Port 1 View  Result Date: 03/09/2017 CLINICAL DATA:  Cough and shortness of breath. EXAM: PORTABLE CHEST 1 VIEW COMPARISON:  March 06, 2017 FINDINGS: The heart, hila, and mediastinum are unchanged. Mild pulmonary venous congestion. Patchy infiltrate developing in the right base. Healed rib fractures on the left. No other acute abnormalities. IMPRESSION: 1. Patchy infiltrate developing in the right base. Mild pulmonary venous congestion. Recommend follow-up to resolution. Electronically Signed   By: Dorise Bullion III M.D   On: 03/09/2017 12:41   Dg Chest Port 1 View  Result Date: 03/06/2017 CLINICAL DATA:  Fever weakness today.  Cough. EXAM: PORTABLE CHEST 1 VIEW COMPARISON:  07/10/2010 FINDINGS: Lungs are adequately inflated as patient is rotated to the left. There is hazy perihilar opacification likely due to mild asymmetric interstitial edema right greater than left. Right infrahilar infection is possible. No effusion. Mild cardiomegaly. Calcified plaque over the thoracic aorta. Degenerative change of the spine. Multiple old left posterior rib fractures. IMPRESSION: Hazy bilateral perihilar opacification right greater than left likely asymmetric edema, although infection in the right infrahilar region is possible. Cardiomegaly. Electronically Signed   By: Marin Olp  M.D.   On: 03/06/2017 20:18    Assessment/Plan Pulmonary infiltrates  Lasix  40 mg IM x1 now  Increase daily p.o. Lasix to 40 mg p.o. daily  Recheck met B next week due to increase in diuretic  Azithromycin 250 mg tablets-2 tablets today, then 1 tablet daily times 4 days  Continue Atrovent 0.02% nebulizers 3 times daily scheduled  Continue Xopenex 1.25 mg nebulizer 3 times daily scheduled  Continue Xopenex 1.25 mg nebulizer every 4 hours as needed dyspnea, wheezing  Continue Robitussin-DM 5 mL p.o. every 4 hours as needed cough  Continue Robinul 1 mg tablet p.o. daily  Continue O2 2 L via nasal cannula.  Okay to wean off oxygen as tolerated.  Maintain sats greater than 92%  Genital herpes simplex, unspecified site  Valtrex 500 mg p.o. twice daily times 3 days, then  Valtrex 500 mg p.o. daily thereafter for prophylaxis  Standard precautions  Good hand hygiene and gloves  Use bedside commode for roommate  Generalized weakness  Continue participating in PT/OT  Continue exercises as taught by PT/OT  Continue tramadol 50 mg p.o. twice daily scheduled  Continue tramadol 50 mg p.o. every 4 hours as needed pain  Ambulate with assistance  Assist with ADLs as appropriate  Protein-calorie malnutrition, unspecified severity (HCC)  Pro-stat 30 mL p.o. twice daily  Ensure Enlive 1 bottle p.o. twice daily  Continue daily multivitamin  Hypomagnesemia  Magnesium oxide 250 mg tablets daily  Vitamin B12 deficiency  Cyanocobalamin 2000 mcg p.o. daily times 14 days, then  Cyanocobalamin 1000 mcg p.o. daily  Family/ staff Communication:   Total Time:  Documentation:  Face to Face:  Family/Phone:   Labs/tests ordered: 2 view chest x-ray, recheck met B next week, reviewed recent labs  Medication list reviewed and assessed for continued appropriateness.  Vikki Ports, NP-C Geriatrics [facility] [phone], [place] [address] Cell Phone (Mon-Fri 8am-5pm):  [phone] On Call:   [phone] & follow prompts after 5pm & weekends Office Phone:  [phone] Office Fax:  [phone]

## 2017-04-08 ENCOUNTER — Other Ambulatory Visit
Admission: RE | Admit: 2017-04-08 | Discharge: 2017-04-08 | Disposition: A | Discharge status: Home / Self Care | Payer: No Typology Code available for payment source | Source: Ambulatory Visit | Attending: Gerontology | Admitting: Gerontology

## 2017-04-08 DIAGNOSIS — N183 Chronic kidney disease, stage 3 (moderate): Secondary | ICD-10-CM | POA: Insufficient documentation

## 2017-04-08 LAB — BASIC METABOLIC PANEL
ANION GAP: 10 (ref 5–15)
BUN: 43 mg/dL — ABNORMAL HIGH (ref 6–20)
CALCIUM: 8.3 mg/dL — AB (ref 8.9–10.3)
CO2: 30 mmol/L (ref 22–32)
Chloride: 97 mmol/L — ABNORMAL LOW (ref 101–111)
Creatinine, Ser: 1.09 mg/dL — ABNORMAL HIGH (ref 0.44–1.00)
GFR, EST AFRICAN AMERICAN: 50 mL/min — AB (ref >60)
GFR, EST NON AFRICAN AMERICAN: 43 mL/min — AB (ref >60)
GLUCOSE: 72 mg/dL (ref 65–99)
POTASSIUM: 3.1 mmol/L — AB (ref 3.5–5.1)
Sodium: 137 mmol/L (ref 135–145)

## 2017-04-14 ENCOUNTER — Encounter
Admission: RE | Admit: 2017-04-14 | Discharge: 2017-04-14 | Disposition: A | Discharge status: Home / Self Care | Payer: Medicare Other | Source: Ambulatory Visit | Attending: Internal Medicine | Admitting: Internal Medicine

## 2017-04-16 ENCOUNTER — Other Ambulatory Visit
Admission: RE | Admit: 2017-04-16 | Discharge: 2017-04-16 | Disposition: A | Discharge status: Home / Self Care | Payer: No Typology Code available for payment source | Source: Ambulatory Visit | Attending: Gerontology | Admitting: Gerontology

## 2017-04-16 DIAGNOSIS — I129 Hypertensive chronic kidney disease with stage 1 through stage 4 chronic kidney disease, or unspecified chronic kidney disease: Secondary | ICD-10-CM | POA: Insufficient documentation

## 2017-04-16 LAB — URINALYSIS, COMPLETE (UACMP) WITH MICROSCOPIC
BILIRUBIN URINE: NEGATIVE
Bacteria, UA: NONE SEEN
Glucose, UA: NEGATIVE mg/dL
HGB URINE DIPSTICK: NEGATIVE
KETONES UR: NEGATIVE mg/dL
LEUKOCYTES UA: NEGATIVE
NITRITE: NEGATIVE
Protein, ur: NEGATIVE mg/dL
RBC / HPF: NONE SEEN RBC/hpf (ref 0–5)
SPECIFIC GRAVITY, URINE: 1.014 (ref 1.005–1.030)
SQUAMOUS EPITHELIAL / LPF: NONE SEEN
pH: 6 (ref 5.0–8.0)

## 2017-04-16 LAB — COMPREHENSIVE METABOLIC PANEL
ALT: 15 U/L (ref 14–54)
ANION GAP: 9 (ref 5–15)
AST: 22 U/L (ref 15–41)
Albumin: 3 g/dL — ABNORMAL LOW (ref 3.5–5.0)
Alkaline Phosphatase: 94 U/L (ref 38–126)
BUN: 37 mg/dL — ABNORMAL HIGH (ref 6–20)
CALCIUM: 8.7 mg/dL — AB (ref 8.9–10.3)
CHLORIDE: 98 mmol/L — AB (ref 101–111)
CO2: 32 mmol/L (ref 22–32)
Creatinine, Ser: 1.23 mg/dL — ABNORMAL HIGH (ref 0.44–1.00)
GFR calc non Af Amer: 37 mL/min — ABNORMAL LOW (ref >60)
GFR, EST AFRICAN AMERICAN: 43 mL/min — AB (ref >60)
Glucose, Bld: 105 mg/dL — ABNORMAL HIGH (ref 65–99)
POTASSIUM: 4.7 mmol/L (ref 3.5–5.1)
SODIUM: 139 mmol/L (ref 135–145)
Total Bilirubin: 0.6 mg/dL (ref 0.3–1.2)
Total Protein: 5.9 g/dL — ABNORMAL LOW (ref 6.5–8.1)

## 2017-04-16 LAB — CBC WITH DIFFERENTIAL/PLATELET
BASOS PCT: 1 %
Basophils Absolute: 0.1 10*3/uL (ref 0–0.1)
EOS ABS: 0.1 10*3/uL (ref 0–0.7)
EOS PCT: 1 %
HCT: 35.4 % (ref 35.0–47.0)
Hemoglobin: 11.5 g/dL — ABNORMAL LOW (ref 12.0–16.0)
LYMPHS ABS: 0.9 10*3/uL — AB (ref 1.0–3.6)
Lymphocytes Relative: 9 %
MCH: 32 pg (ref 26.0–34.0)
MCHC: 32.5 g/dL (ref 32.0–36.0)
MCV: 98.3 fL (ref 80.0–100.0)
MONOS PCT: 7 %
Monocytes Absolute: 0.7 10*3/uL (ref 0.2–0.9)
NEUTROS PCT: 82 %
Neutro Abs: 8.3 10*3/uL — ABNORMAL HIGH (ref 1.4–6.5)
PLATELETS: 103 10*3/uL — AB (ref 150–440)
RBC: 3.6 MIL/uL — ABNORMAL LOW (ref 3.80–5.20)
RDW: 14.2 % (ref 11.5–14.5)
WBC: 10.1 10*3/uL (ref 3.6–11.0)

## 2017-04-17 ENCOUNTER — Non-Acute Institutional Stay (SKILLED_NURSING_FACILITY): Payer: Medicare Other | Admitting: Gerontology

## 2017-04-17 ENCOUNTER — Encounter: Payer: Self-pay | Admitting: Gerontology

## 2017-04-17 DIAGNOSIS — J189 Pneumonia, unspecified organism: Secondary | ICD-10-CM | POA: Diagnosis not present

## 2017-04-17 DIAGNOSIS — R634 Abnormal weight loss: Secondary | ICD-10-CM

## 2017-04-17 DIAGNOSIS — E46 Unspecified protein-calorie malnutrition: Secondary | ICD-10-CM

## 2017-04-17 DIAGNOSIS — R918 Other nonspecific abnormal finding of lung field: Secondary | ICD-10-CM | POA: Diagnosis not present

## 2017-04-17 DIAGNOSIS — R531 Weakness: Secondary | ICD-10-CM | POA: Diagnosis not present

## 2017-04-17 DIAGNOSIS — E538 Deficiency of other specified B group vitamins: Secondary | ICD-10-CM

## 2017-04-17 DIAGNOSIS — A6000 Herpesviral infection of urogenital system, unspecified: Secondary | ICD-10-CM

## 2017-04-17 NOTE — Progress Notes (Signed)
Location:   [facility] Room Number: 210B Place of Service:  SNF [phone]) Provider:  Lorenso Quarry, NP-C  Mick Sell, MD  Patient Care Team: Mick Sell, MD as PCP - General (Infectious Diseases)  Extended Emergency Contact Information Primary Emergency Contact: Burmaster,Scott          [place], [place] [address] D[place] of [place] Home Phone: [phone] Mobile Phone: [phone] Relation: Son Secondary Emergency Contact: Estrella Deeds Address: [address]          [place], [place] [address] D[place] of [place] Home Phone: [phone] Relation: Daughter  Code Status:  FULL Goals of care: Advanced Directive information Advanced Directives 04/17/2017  Does Patient Have a Medical Advance Directive? Yes  Type of Estate agent of [place];Living will  Does patient want to make changes to medical advance directive? -  Copy of Healthcare Power of Attorney in Chart? Yes     Chief Complaint  Patient presents with  . Discharge Visit    Discharge from SNF    HPI:  Pt is a 84 y.o. female seen today for discharge evaluation. Pt was admitted to the facility for rehab following hospitalization at [facility] for sepsis r/t Pneumonia with resulting generalized weakness and deconditioning. Pt has been participating with PT and OT. She is ambulatory with a walker. She denies pain, denies chest pain or shortness of breath. While in rehab, she did develop some pulmonary infiltrates. Her oxygen had to be reapplied, Lasix increased, given antibiotics, breathing treatments, cough suppressants and anticholingergics for the secretions. Pt was later able to be weaned back off the O2. Sats 95% on room air at this time. She was having weight loss. Labs evaluated. Pt was found to have protein-calorie malnutrition, hypomagnesemia and vitamin B12 deficiency. She was started on supplements for this. Pt had a herpes out break with appropriate treatment  and resumption of suppression therapy. At time of discharge, pt is stable. VSS. Pt reports her appetite has improved. She is voiding well and having regular BMs. No other complaints.    Please note pt with limited verbal/cognitive ability. Unable to obtain complete ROS. Some ROS info obtained from staff and documentation.    Past Medical History:  Diagnosis Date  . Anxiety   . Anxiety disorder    unspecified  . Cerebral infarction (HCC)   . CVA (cerebral vascular accident) (HCC)   . Dementia   . Depression   . GERD (gastroesophageal reflux disease)   . History of recurrent UTIs   . HLD (hyperlipidemia)   . HTN (hypertension)   . Vertigo    syncope   Past Surgical History:  Procedure Laterality Date  . CARPAL TUNNEL RELEASE Right 1995   endoscopic   . CARPAL TUNNEL RELEASE Left 04/30/2013   endoscopic , Dr. Rosita Kea   . CATARACT EXTRACTION Right   . CHOLECYSTECTOMY    . ELBOW SURGERY Left   . ERCP W/ SPHINCTEROTOMY AND BALLOON DILATION  2008   and endoscopy  . ORIF DISTAL RADIUS FRACTURE Right 2002  . TOTAL KNEE ARTHROPLASTY Left 11/23/2007  . TRIGGER FINGER RELEASE  04/30/2013   third digit, incision tendon sheath for trigger finger    Allergies  Allergen Reactions  . Clarithromycin     Other reaction(s): Abdominal Pain  . Oxycontin [Oxycodone Hcl] Other (See Comments)    Hallucinations   . Sulfamethoxazole-Trimethoprim     Other reaction(s): Other (See Comments) Chest pain    Allergies as  of 04/17/2017      Reactions   Clarithromycin    Other reaction(s): Abdominal Pain   Oxycontin [oxycodone Hcl] Other (See Comments)   Hallucinations   Sulfamethoxazole-trimethoprim    Other reaction(s): Other (See Comments) Chest pain      Medication List        Accurate as of 04/17/17 12:02 PM. Always use your most recent med list.          alum & mag hydroxide-simeth 200-200-20 MG/5ML suspension Commonly known as:  MAALOX/MYLANTA Take 30 mLs by mouth every 6 (six)  hours as needed for indigestion or heartburn.   aspirin 325 MG tablet Take 1 tablet (325 mg total) by mouth daily.   bisacodyl 10 MG suppository Commonly known as:  DULCOLAX Place 10 mg rectally daily as needed.   cetirizine 10 MG tablet Commonly known as:  ZYRTEC Take 10 mg by mouth daily as needed for allergies.   cholecalciferol 1000 units tablet Commonly known as:  VITAMIN D Take 1,000 Units by mouth daily.   citalopram 20 MG tablet Commonly known as:  CELEXA Take 1 tablet by mouth daily.   cyanocobalamin 1000 MCG tablet Take 2,000 mcg by mouth daily. 2 tabs   donepezil 10 MG tablet Commonly known as:  ARICEPT Take 1 tablet by mouth at bedtime.   ENSURE ENLIVE PO Take 1 Bottle by mouth 2 (two) times daily between meals.   feeding supplement (PRO-STAT SUGAR FREE 64) Liqd Take 30 mLs by mouth 2 (two) times daily between meals.   fluticasone 50 MCG/ACT nasal spray Commonly known as:  FLONASE Place 1 spray into both nostrils daily.   Fluticasone-Salmeterol 100-50 MCG/DOSE Aepb Commonly known as:  ADVAIR Inhale 1 puff into the lungs 2 (two) times daily.   furosemide 20 MG tablet Commonly known as:  LASIX Take 1 tablet (20 mg total) by mouth daily.   galantamine 4 MG tablet Commonly known as:  RAZADYNE Take 4 mg by mouth 2 (two) times daily with a meal.   GLUCOSAMINE 1500 COMPLEX PO Take 1 capsule by mouth daily.   glycopyrrolate 1 MG tablet Commonly known as:  ROBINUL Take 1 mg by mouth daily.   guaiFENesin-dextromethorphan 100-10 MG/5ML syrup Commonly known as:  ROBITUSSIN DM Take 5 mLs by mouth every 4 (four) hours as needed for cough.   I-VITE PROTECT PO Take 1 tablet by mouth daily.   ipratropium 0.02 % nebulizer solution Commonly known as:  ATROVENT Take 2.5 mLs (0.5 mg total) by nebulization 3 (three) times daily.   levalbuterol 1.25 MG/0.5ML nebulizer solution Commonly known as:  XOPENEX Take 1.25 mg by nebulization every 4 (four) hours as  needed for wheezing or shortness of breath.   levalbuterol 1.25 MG/0.5ML nebulizer solution Commonly known as:  XOPENEX Take 1.25 mg by nebulization 3 (three) times daily.   Magnesium Oxide 250 MG Tabs Take 1 tablet by mouth daily.   meclizine 25 MG tablet Commonly known as:  ANTIVERT Take 25 mg by mouth daily as needed for dizziness.   MIRALAX packet Generic drug:  polyethylene glycol Take 17 g by mouth as needed.   potassium chloride SA 20 MEQ tablet Commonly known as:  K-DUR,KLOR-CON Take 20 mEq by mouth daily.   QUEtiapine 25 MG tablet Commonly known as:  SEROQUEL Take 25 mg by mouth at bedtime.   sennosides-docusate sodium 8.6-50 MG tablet Commonly known as:  SENOKOT-S Take 1 tablet by mouth 2 (two) times daily.   sodium chloride 0.65 %  nasal spray Commonly known as:  OCEAN Place 2 sprays into the nose as needed. Into each nostril for congestion, dry sinuses, irritation. May keep at bedside.   sodium phosphate 7-19 GM/118ML Enem Place 1 enema rectally every three (3) days as needed for severe constipation. Constipation not relieved by Milk of Magnesia or Bisacodyl Suppository.   sorbitol 70 % solution Take 30 mLs by mouth every 2 (two) hours as needed. Give 30 mL po Q 2 hours until large BM. Then may change order to prn- for constipation/ impaction   traMADol 50 MG tablet Commonly known as:  ULTRAM Take by mouth 2 (two) times daily. pain. Hold for sedation   traMADol 50 MG tablet Commonly known as:  ULTRAM Take 50 mg by mouth every 4 (four) hours as needed.   valACYclovir 500 MG tablet Commonly known as:  VALTREX Take 500 mg by mouth daily.       Review of Systems  Unable to perform ROS: Dementia  Constitutional: Negative for activity change, appetite change, chills, diaphoresis and fever.  HENT: Negative for congestion, mouth sores, nosebleeds, postnasal drip, sneezing, sore throat, trouble swallowing and voice change.   Respiratory: Negative for apnea,  cough, choking, chest tightness, shortness of breath and wheezing.   Cardiovascular: Negative for chest pain, palpitations and leg swelling.  Gastrointestinal: Negative for abdominal distention, abdominal pain, constipation, diarrhea and nausea.  Genitourinary: Negative for difficulty urinating, dysuria, frequency and urgency.  Musculoskeletal: Positive for gait problem. Negative for back pain and myalgias. Arthralgias: typical arthritis.  Skin: Negative for color change, pallor, rash and wound.  Neurological: Positive for weakness. Negative for dizziness, tremors, syncope, speech difficulty, numbness and headaches.  Psychiatric/Behavioral: Negative for agitation and behavioral problems.  All other systems reviewed and are negative.   Immunization History  Administered Date(s) Administered  . Influenza-Unspecified 10/23/2012, 12/07/2013   Pertinent  Health Maintenance Due  Topic Date Due  . DEXA SCAN  11/20/1991  . PNA vac Low Risk Adult (1 of 2 - PCV13) 11/20/1991  . INFLUENZA VACCINE  08/14/2017   No flowsheet data found. Functional Status Survey:    Vitals:   04/17/17 1133  BP: (!) 129/54  Pulse: 76  Resp: 20  Temp: 98.5 F (36.9 C)  TempSrc: Oral  SpO2: 95%  Weight: 121 lb 3.2 oz (55 kg)  Height: 5\' 2"  (1.575 m)   Body mass index is 22.17 kg/m. Physical Exam  Constitutional: Vital signs are normal. She appears well-developed and well-nourished. She is active and cooperative. She does not appear ill. No distress.  HENT:  Head: Normocephalic and atraumatic.  Mouth/Throat: Uvula is midline, oropharynx is clear and moist and mucous membranes are normal. Mucous membranes are not pale, not dry and not cyanotic.  Eyes: Pupils are equal, round, and reactive to light. Conjunctivae, EOM and lids are normal.  Neck: Trachea normal, normal range of motion and full passive range of motion without pain. Neck supple. No JVD present. No tracheal deviation, no edema and no erythema  present. No thyromegaly present.  Cardiovascular: Normal rate, regular rhythm, normal heart sounds, intact distal pulses and normal pulses. Exam reveals no gallop, no distant heart sounds and no friction rub.  No murmur heard. Pulses:      Dorsalis pedis pulses are 2+ on the right side, and 2+ on the left side.  No edema  Pulmonary/Chest: Effort normal. No accessory muscle usage. No respiratory distress. She has decreased breath sounds in the right lower field and the left  lower field. She has no wheezes. She has no rhonchi. She has no rales. She exhibits no tenderness.  Abdominal: Soft. Normal appearance and bowel sounds are normal. She exhibits no distension and no ascites. There is no tenderness.  Musculoskeletal: Normal range of motion. She exhibits no edema or tenderness.  Expected osteoarthritis, stiffness; Bilateral Calves soft, supple. Negative Homan's Sign. B- pedal pulses equal; generalized weakness  Neurological: She is alert. She has normal strength. She displays a negative Romberg sign. Coordination and gait abnormal.  Pleasantly confused  Skin: Skin is warm, dry and intact. She is not diaphoretic. No cyanosis. No pallor. Nails show no clubbing.  Psychiatric: She has a normal mood and affect. Her speech is normal and behavior is normal. Judgment and thought content normal. Cognition and memory are impaired. She exhibits abnormal recent memory and abnormal remote memory.  Nursing note and vitals reviewed.   Labs reviewed: Recent Labs    04/02/17 0623 04/03/17 1155 04/08/17 0826 04/16/17 1440  NA 139 141 137 139  K 2.9* 4.2 3.1* 4.7  CL 101 103 97* 98*  CO2 28 28 30  32  GLUCOSE 88 105* 72 105*  BUN 25* 26* 43* 37*  CREATININE 0.86 1.02* 1.09* 1.23*  CALCIUM 8.1* 8.6* 8.3* 8.7*  MG 1.5*  --   --   --    Recent Labs    03/06/17 1928 04/02/17 0623 04/16/17 1440  AST 37 25 22  ALT 23 16 15   ALKPHOS 105 77 94  BILITOT 0.5 0.5 0.6  PROT 6.6 5.4* 5.9*  ALBUMIN 3.5 2.5*  3.0*   Recent Labs    03/06/17 1928  03/10/17 0356 04/02/17 0623 04/16/17 1440  WBC 17.2*   < > 17.8* 10.9 10.1  NEUTROABS 15.8*  --   --  8.8* 8.3*  HGB 13.3   < > 11.5* 11.1* 11.5*  HCT 39.7   < > 35.0 33.6* 35.4  MCV 97.1   < > 97.1 98.3 98.3  PLT 111*   < > 84* 89* 103*   < > = values in this interval not displayed.   No results found for: TSH No results found for: HGBA1C Lab Results  Component Value Date   CHOL 117 04/02/2017   HDL 37 (L) 04/02/2017   LDLCALC 64 04/02/2017   TRIG 81 04/02/2017   CHOLHDL 3.2 04/02/2017    Significant Diagnostic Results in last 30 days:  No results found.  Assessment/Plan  Community acquired pneumonia, unspecified laterality  Resolved   Generalized weakness  Improving  Continue working with PT/OT  Continue exercises as taught by PT/OT  Ambulate with assistance  Pulmonary infiltrates  Resolved   Genital herpes simplex, unspecified site  Active flare resolved  Continue suppressive therapy  Valtrex 500 mg po Q Day  Protein-calorie malnutrition, unspecified severity (HCC)  Continue Pro-stat 30 ml po bid  Continue Ensure Enlive- 1 bottle po bid  continue daily mvi  Hypomagnesemia  Stable  Continue Magnesium Oxide 250 mg po Q Day  Vitamin B12 deficiency  Stable  Continue Cyanocobalamin 1,000 mcg po Q Day  Weight loss  Follow up with PCP asap after discharge for continuity of care and medication management  Family/ staff Communication:   Total Time:  Documentation:  Face to Face:  Family/Phone:   Labs/tests ordered:    [name] being discharged with the following home health services: HHPT/OT/NSG through [facility]   [name] being discharged with the following durable medical equipment: Hospital bed  and portable O2   Patient has been advised to f/u with their PCP in 1-2 weeks to bring them up to date on their rehab stay.  Social services at facility was responsible for  arranging this appointment.  Pt was provided with a 30 day supply of prescriptions for medications and refills must be obtained from their PCP.  For controlled substances, a more limited supply may be provided adequate until PCP appointment only.  Medication list reviewed and assessed for continued appropriateness. Monthly medication orders reviewed and signed.  Brynda Rim, NP-C Geriatrics [facility] [phone] [address], [place] [address] Cell Phone (Mon-Fri 8am-5pm):  [phone] On Call:  [phone] & follow prompts after 5pm & weekends Office Phone:  [phone] Office Fax:  [phone]

## 2017-04-18 LAB — URINE CULTURE: Culture: NO GROWTH

## 2017-05-01 ENCOUNTER — Emergency Department: Payer: Medicare Other

## 2017-05-01 ENCOUNTER — Other Ambulatory Visit: Payer: Self-pay

## 2017-05-01 ENCOUNTER — Encounter: Payer: Self-pay | Admitting: Registered Nurse

## 2017-05-01 ENCOUNTER — Encounter
Admission: EM | Disposition: A | Discharge status: Skilled Nursing Facility | Payer: Self-pay | Source: Home / Self Care | Attending: Internal Medicine

## 2017-05-01 ENCOUNTER — Inpatient Hospital Stay
Admission: EM | Admit: 2017-05-01 | Discharge: 2017-05-07 | DRG: 377 | Disposition: A | Discharge status: Skilled Nursing Facility | Payer: Medicare Other | Attending: Internal Medicine | Admitting: Internal Medicine

## 2017-05-01 ENCOUNTER — Encounter: Payer: Self-pay | Admitting: Emergency Medicine

## 2017-05-01 DIAGNOSIS — F0280 Dementia in other diseases classified elsewhere without behavioral disturbance: Secondary | ICD-10-CM | POA: Diagnosis present

## 2017-05-01 DIAGNOSIS — D62 Acute posthemorrhagic anemia: Secondary | ICD-10-CM | POA: Diagnosis present

## 2017-05-01 DIAGNOSIS — Z87440 Personal history of urinary (tract) infections: Secondary | ICD-10-CM

## 2017-05-01 DIAGNOSIS — Z7982 Long term (current) use of aspirin: Secondary | ICD-10-CM

## 2017-05-01 DIAGNOSIS — Z881 Allergy status to other antibiotic agents status: Secondary | ICD-10-CM | POA: Diagnosis not present

## 2017-05-01 DIAGNOSIS — J9601 Acute respiratory failure with hypoxia: Secondary | ICD-10-CM | POA: Diagnosis present

## 2017-05-01 DIAGNOSIS — R578 Other shock: Secondary | ICD-10-CM | POA: Diagnosis present

## 2017-05-01 DIAGNOSIS — Z96652 Presence of left artificial knee joint: Secondary | ICD-10-CM | POA: Diagnosis present

## 2017-05-01 DIAGNOSIS — K219 Gastro-esophageal reflux disease without esophagitis: Secondary | ICD-10-CM | POA: Diagnosis present

## 2017-05-01 DIAGNOSIS — I4891 Unspecified atrial fibrillation: Secondary | ICD-10-CM | POA: Diagnosis present

## 2017-05-01 DIAGNOSIS — Z79891 Long term (current) use of opiate analgesic: Secondary | ICD-10-CM

## 2017-05-01 DIAGNOSIS — Z7189 Other specified counseling: Secondary | ICD-10-CM

## 2017-05-01 DIAGNOSIS — Z515 Encounter for palliative care: Secondary | ICD-10-CM | POA: Diagnosis not present

## 2017-05-01 DIAGNOSIS — Z7951 Long term (current) use of inhaled steroids: Secondary | ICD-10-CM

## 2017-05-01 DIAGNOSIS — E872 Acidosis, unspecified: Secondary | ICD-10-CM

## 2017-05-01 DIAGNOSIS — R0902 Hypoxemia: Secondary | ICD-10-CM

## 2017-05-01 DIAGNOSIS — G3183 Dementia with Lewy bodies: Secondary | ICD-10-CM | POA: Diagnosis not present

## 2017-05-01 DIAGNOSIS — Z8249 Family history of ischemic heart disease and other diseases of the circulatory system: Secondary | ICD-10-CM

## 2017-05-01 DIAGNOSIS — E785 Hyperlipidemia, unspecified: Secondary | ICD-10-CM | POA: Diagnosis present

## 2017-05-01 DIAGNOSIS — Z9841 Cataract extraction status, right eye: Secondary | ICD-10-CM

## 2017-05-01 DIAGNOSIS — K920 Hematemesis: Principal | ICD-10-CM | POA: Diagnosis present

## 2017-05-01 DIAGNOSIS — J811 Chronic pulmonary edema: Secondary | ICD-10-CM | POA: Diagnosis present

## 2017-05-01 DIAGNOSIS — Z9049 Acquired absence of other specified parts of digestive tract: Secondary | ICD-10-CM | POA: Diagnosis not present

## 2017-05-01 DIAGNOSIS — Z820 Family history of epilepsy and other diseases of the nervous system: Secondary | ICD-10-CM

## 2017-05-01 DIAGNOSIS — Z79899 Other long term (current) drug therapy: Secondary | ICD-10-CM

## 2017-05-01 DIAGNOSIS — E8770 Fluid overload, unspecified: Secondary | ICD-10-CM | POA: Diagnosis present

## 2017-05-01 DIAGNOSIS — K922 Gastrointestinal hemorrhage, unspecified: Secondary | ICD-10-CM

## 2017-05-01 DIAGNOSIS — F329 Major depressive disorder, single episode, unspecified: Secondary | ICD-10-CM | POA: Diagnosis present

## 2017-05-01 DIAGNOSIS — R000 Tachycardia, unspecified: Secondary | ICD-10-CM | POA: Diagnosis present

## 2017-05-01 DIAGNOSIS — Z8673 Personal history of transient ischemic attack (TIA), and cerebral infarction without residual deficits: Secondary | ICD-10-CM

## 2017-05-01 DIAGNOSIS — Z882 Allergy status to sulfonamides status: Secondary | ICD-10-CM | POA: Diagnosis not present

## 2017-05-01 DIAGNOSIS — F419 Anxiety disorder, unspecified: Secondary | ICD-10-CM | POA: Diagnosis present

## 2017-05-01 DIAGNOSIS — R040 Epistaxis: Secondary | ICD-10-CM | POA: Diagnosis not present

## 2017-05-01 DIAGNOSIS — J441 Chronic obstructive pulmonary disease with (acute) exacerbation: Secondary | ICD-10-CM | POA: Diagnosis present

## 2017-05-01 DIAGNOSIS — Z87891 Personal history of nicotine dependence: Secondary | ICD-10-CM

## 2017-05-01 DIAGNOSIS — K3189 Other diseases of stomach and duodenum: Secondary | ICD-10-CM | POA: Diagnosis present

## 2017-05-01 DIAGNOSIS — N179 Acute kidney failure, unspecified: Secondary | ICD-10-CM | POA: Diagnosis present

## 2017-05-01 DIAGNOSIS — J690 Pneumonitis due to inhalation of food and vomit: Secondary | ICD-10-CM | POA: Diagnosis present

## 2017-05-01 DIAGNOSIS — Z791 Long term (current) use of non-steroidal anti-inflammatories (NSAID): Secondary | ICD-10-CM

## 2017-05-01 DIAGNOSIS — N183 Chronic kidney disease, stage 3 (moderate): Secondary | ICD-10-CM | POA: Diagnosis present

## 2017-05-01 DIAGNOSIS — I129 Hypertensive chronic kidney disease with stage 1 through stage 4 chronic kidney disease, or unspecified chronic kidney disease: Secondary | ICD-10-CM | POA: Diagnosis present

## 2017-05-01 DIAGNOSIS — Z885 Allergy status to narcotic agent status: Secondary | ICD-10-CM

## 2017-05-01 HISTORY — PX: ESOPHAGOGASTRODUODENOSCOPY (EGD) WITH PROPOFOL: SHX5813

## 2017-05-01 LAB — CBC
HCT: 34.1 % — ABNORMAL LOW (ref 35.0–47.0)
HEMOGLOBIN: 11.5 g/dL — AB (ref 12.0–16.0)
MCH: 31.7 pg (ref 26.0–34.0)
MCHC: 33.9 g/dL (ref 32.0–36.0)
MCV: 93.6 fL (ref 80.0–100.0)
PLATELETS: 110 10*3/uL — AB (ref 150–440)
RBC: 3.64 MIL/uL — AB (ref 3.80–5.20)
RDW: 15.5 % — ABNORMAL HIGH (ref 11.5–14.5)
WBC: 22.9 10*3/uL — ABNORMAL HIGH (ref 3.6–11.0)

## 2017-05-01 LAB — COMPREHENSIVE METABOLIC PANEL
ALBUMIN: 2.6 g/dL — AB (ref 3.5–5.0)
ALT: 13 U/L — AB (ref 14–54)
AST: 34 U/L (ref 15–41)
Alkaline Phosphatase: 95 U/L (ref 38–126)
Anion gap: 12 (ref 5–15)
BILIRUBIN TOTAL: 0.5 mg/dL (ref 0.3–1.2)
BUN: 29 mg/dL — AB (ref 6–20)
CO2: 20 mmol/L — ABNORMAL LOW (ref 22–32)
CREATININE: 1.1 mg/dL — AB (ref 0.44–1.00)
Calcium: 8.3 mg/dL — ABNORMAL LOW (ref 8.9–10.3)
Chloride: 110 mmol/L (ref 101–111)
GFR calc Af Amer: 50 mL/min — ABNORMAL LOW (ref >60)
GFR, EST NON AFRICAN AMERICAN: 43 mL/min — AB (ref >60)
GLUCOSE: 245 mg/dL — AB (ref 65–99)
Potassium: 4 mmol/L (ref 3.5–5.1)
Sodium: 142 mmol/L (ref 135–145)
TOTAL PROTEIN: 4.7 g/dL — AB (ref 6.5–8.1)

## 2017-05-01 LAB — GLUCOSE, CAPILLARY: Glucose-Capillary: 137 mg/dL — ABNORMAL HIGH (ref 65–99)

## 2017-05-01 LAB — BASIC METABOLIC PANEL
ANION GAP: 6 (ref 5–15)
BUN: 44 mg/dL — AB (ref 6–20)
CALCIUM: 8 mg/dL — AB (ref 8.9–10.3)
CO2: 22 mmol/L (ref 22–32)
Chloride: 114 mmol/L — ABNORMAL HIGH (ref 101–111)
Creatinine, Ser: 0.93 mg/dL (ref 0.44–1.00)
GFR calc Af Amer: >60 mL/min (ref >60)
GFR, EST NON AFRICAN AMERICAN: 53 mL/min — AB (ref >60)
GLUCOSE: 126 mg/dL — AB (ref 65–99)
Potassium: 4.2 mmol/L (ref 3.5–5.1)
SODIUM: 142 mmol/L (ref 135–145)

## 2017-05-01 LAB — CBC WITH DIFFERENTIAL/PLATELET
BASOS PCT: 1 %
Basophils Absolute: 0.2 10*3/uL — ABNORMAL HIGH (ref 0–0.1)
EOS ABS: 0.2 10*3/uL (ref 0–0.7)
Eosinophils Relative: 1 %
HCT: 27.1 % — ABNORMAL LOW (ref 35.0–47.0)
HEMOGLOBIN: 8.6 g/dL — AB (ref 12.0–16.0)
LYMPHS ABS: 3.1 10*3/uL (ref 1.0–3.6)
Lymphocytes Relative: 13 %
MCH: 31.7 pg (ref 26.0–34.0)
MCHC: 31.7 g/dL — AB (ref 32.0–36.0)
MCV: 100 fL (ref 80.0–100.0)
MONOS PCT: 4 %
Monocytes Absolute: 1 10*3/uL — ABNORMAL HIGH (ref 0.2–0.9)
NEUTROS ABS: 19.7 10*3/uL — AB (ref 1.4–6.5)
Neutrophils Relative %: 81 %
Platelets: 197 10*3/uL (ref 150–440)
RBC: 2.71 MIL/uL — ABNORMAL LOW (ref 3.80–5.20)
RDW: 15.2 % — ABNORMAL HIGH (ref 11.5–14.5)
SMEAR REVIEW: ADEQUATE
WBC: 24.2 10*3/uL — ABNORMAL HIGH (ref 3.6–11.0)

## 2017-05-01 LAB — PROTIME-INR
INR: 1.26
INR: 1.28
PROTHROMBIN TIME: 15.7 s — AB (ref 11.4–15.2)
PROTHROMBIN TIME: 15.9 s — AB (ref 11.4–15.2)

## 2017-05-01 LAB — MRSA PCR SCREENING: MRSA BY PCR: NEGATIVE

## 2017-05-01 LAB — LACTIC ACID, PLASMA
Lactic Acid, Venous: 7.6 mmol/L (ref 0.5–1.9)
Lactic Acid, Venous: 2.1 mmol/L (ref 0.5–1.9)

## 2017-05-01 LAB — BLOOD GAS, ARTERIAL
Acid-base deficit: 6.7 mmol/L — ABNORMAL HIGH (ref 0.0–2.0)
BICARBONATE: 21.8 mmol/L (ref 20.0–28.0)
FIO2: 0.4
LHR: 16 {breaths}/min
O2 Saturation: 95.9 %
PATIENT TEMPERATURE: 35.9
PEEP: 5 cmH2O
PO2 ART: 93 mmHg (ref 83.0–108.0)
VT: 450 mL
pCO2 arterial: 57 mmHg — ABNORMAL HIGH (ref 32.0–48.0)
pH, Arterial: 7.2 — ABNORMAL LOW (ref 7.350–7.450)

## 2017-05-01 LAB — PLATELET COUNT
PLATELETS: 135 10*3/uL — AB (ref 150–440)
PLATELETS: 124 10*3/uL — AB (ref 150–440)

## 2017-05-01 LAB — FIBRIN DERIVATIVES D-DIMER (ARMC ONLY): Fibrin derivatives D-dimer (ARMC): 4320.43 ng/mL (FEU) — ABNORMAL HIGH (ref 0.00–499.00)

## 2017-05-01 LAB — HEMOGLOBIN AND HEMATOCRIT, BLOOD
HCT: 36.4 % (ref 35.0–47.0)
HCT: 34 % — ABNORMAL LOW (ref 35.0–47.0)
HEMATOCRIT: 33.9 % — AB (ref 35.0–47.0)
HEMATOCRIT: 34.8 % — AB (ref 35.0–47.0)
HEMOGLOBIN: 11.1 g/dL — AB (ref 12.0–16.0)
HEMOGLOBIN: 11.7 g/dL — AB (ref 12.0–16.0)
Hemoglobin: 12.2 g/dL (ref 12.0–16.0)
Hemoglobin: 11.3 g/dL — ABNORMAL LOW (ref 12.0–16.0)

## 2017-05-01 LAB — TECHNOLOGIST SMEAR REVIEW

## 2017-05-01 LAB — TSH: TSH: 10.02 u[IU]/mL — ABNORMAL HIGH (ref 0.350–4.500)

## 2017-05-01 LAB — MASSIVE TRANSFUSION PROTOCOL ORDER (BLOOD BANK NOTIFICATION)

## 2017-05-01 LAB — FIBRINOGEN: FIBRINOGEN: 254 mg/dL (ref 210–475)

## 2017-05-01 LAB — APTT: APTT: 27 s (ref 24–36)

## 2017-05-01 LAB — TROPONIN I

## 2017-05-01 LAB — ABO/RH: ABO/RH(D): O NEG

## 2017-05-01 SURGERY — ESOPHAGOGASTRODUODENOSCOPY (EGD) WITH PROPOFOL
Anesthesia: General

## 2017-05-01 MED ORDER — MIDAZOLAM HCL 2 MG/2ML IJ SOLN: 1.0000 mg | INTRAMUSCULAR | Status: DC | PRN

## 2017-05-01 MED ORDER — ONDANSETRON HCL 4 MG PO TABS: 4.0000 mg | ORAL_TABLET | Freq: Four times a day (QID) | ORAL | Status: DC | PRN

## 2017-05-01 MED ORDER — FENTANYL BOLUS VIA INFUSION
25.0000 ug | INTRAVENOUS | Status: DC | PRN
  Administered 2017-05-01: 25 ug via INTRAVENOUS
  Filled 2017-05-01: qty 25

## 2017-05-01 MED ORDER — FENTANYL CITRATE (PF) 100 MCG/2ML IJ SOLN
100.0000 ug | Freq: Once | INTRAMUSCULAR | Status: AC
  Administered 2017-05-01: 100 ug via INTRAVENOUS

## 2017-05-01 MED ORDER — TRANEXAMIC ACID 1000 MG/10ML IV SOLN
1000.0000 mg | Freq: Once | INTRAVENOUS | Status: AC
  Administered 2017-05-01: 1000 mg via INTRAVENOUS
  Filled 2017-05-01: qty 10

## 2017-05-01 MED ORDER — DOCUSATE SODIUM 100 MG PO CAPS
100.0000 mg | ORAL_CAPSULE | Freq: Two times a day (BID) | ORAL | Status: DC
  Administered 2017-05-03 – 2017-05-07 (×7): 100 mg via ORAL
  Filled 2017-05-01 (×10): qty 1

## 2017-05-01 MED ORDER — ORAL CARE MOUTH RINSE
15.0000 mL | Freq: Two times a day (BID) | OROMUCOSAL | Status: DC
  Administered 2017-05-03 – 2017-05-06 (×3): 15 mL via OROMUCOSAL

## 2017-05-01 MED ORDER — CHLORHEXIDINE GLUCONATE 0.12 % MT SOLN
15.0000 mL | Freq: Two times a day (BID) | OROMUCOSAL | Status: DC
Start: 2017-05-01 — End: 2017-05-07
  Administered 2017-05-01 – 2017-05-07 (×11): 15 mL via OROMUCOSAL
  Filled 2017-05-01 (×9): qty 15

## 2017-05-01 MED ORDER — PROPOFOL 1000 MG/100ML IV EMUL
5.0000 ug/kg/min | Freq: Once | INTRAVENOUS | Status: AC
  Administered 2017-05-01: 5 ug/kg/min via INTRAVENOUS
  Filled 2017-05-01: qty 100

## 2017-05-01 MED ORDER — METOCLOPRAMIDE HCL 5 MG/ML IJ SOLN
10.0000 mg | Freq: Once | INTRAMUSCULAR | Status: AC
  Administered 2017-05-01: 10 mg via INTRAVENOUS
  Filled 2017-05-01: qty 2

## 2017-05-01 MED ORDER — FENTANYL 2500MCG IN NS 250ML (10MCG/ML) PREMIX INFUSION: 50.0000 ug/h | INTRAVENOUS | Status: DC

## 2017-05-01 MED ORDER — KETAMINE HCL 10 MG/ML IJ SOLN
100.0000 mg | Freq: Once | INTRAMUSCULAR | Status: AC
  Administered 2017-05-01: 100 mg via INTRAVENOUS

## 2017-05-01 MED ORDER — SODIUM CHLORIDE 0.9 % IV SOLN: 50.0000 ug/h | INTRAVENOUS | Status: DC

## 2017-05-01 MED ORDER — SODIUM CHLORIDE 0.9 % IV SOLN
3.0000 g | Freq: Four times a day (QID) | INTRAVENOUS | Status: DC
  Administered 2017-05-01 – 2017-05-05 (×16): 3 g via INTRAVENOUS
  Filled 2017-05-01 (×20): qty 3

## 2017-05-01 MED ORDER — SODIUM CHLORIDE 0.9 % IV SOLN: Freq: Once | INTRAVENOUS | Status: DC

## 2017-05-01 MED ORDER — FENTANYL 2500MCG IN NS 250ML (10MCG/ML) PREMIX INFUSION
25.0000 ug/h | INTRAVENOUS | Status: DC
  Administered 2017-05-01: 25 ug/h via INTRAVENOUS
  Filled 2017-05-01: qty 250

## 2017-05-01 MED ORDER — ACETAMINOPHEN 325 MG PO TABS: 650.0000 mg | ORAL_TABLET | Freq: Four times a day (QID) | ORAL | Status: DC | PRN

## 2017-05-01 MED ORDER — SUCCINYLCHOLINE CHLORIDE 20 MG/ML IJ SOLN: 100.0000 mg | Freq: Once | INTRAMUSCULAR | Status: DC

## 2017-05-01 MED ORDER — SODIUM CHLORIDE 0.9 % IV SOLN
INTRAVENOUS | Status: DC
  Administered 2017-05-01: 100 mL/h via INTRAVENOUS
  Administered 2017-05-01 – 2017-05-02 (×3): via INTRAVENOUS

## 2017-05-01 MED ORDER — FENTANYL CITRATE (PF) 100 MCG/2ML IJ SOLN: 50.0000 ug | Freq: Once | INTRAMUSCULAR | Status: DC

## 2017-05-01 MED ORDER — METOPROLOL TARTRATE 5 MG/5ML IV SOLN
2.5000 mg | Freq: Once | INTRAVENOUS | Status: AC
  Administered 2017-05-01: 2.5 mg via INTRAVENOUS
  Filled 2017-05-01: qty 5

## 2017-05-01 MED ORDER — CHLORHEXIDINE GLUCONATE 0.12% ORAL RINSE (MEDLINE KIT)
15.0000 mL | Freq: Two times a day (BID) | OROMUCOSAL | Status: DC
  Administered 2017-05-01: 15 mL via OROMUCOSAL

## 2017-05-01 MED ORDER — PANTOPRAZOLE SODIUM 40 MG IV SOLR
40.0000 mg | Freq: Two times a day (BID) | INTRAVENOUS | Status: DC
  Administered 2017-05-01: 40 mg via INTRAVENOUS
  Filled 2017-05-01: qty 40

## 2017-05-01 MED ORDER — ONDANSETRON HCL 4 MG/2ML IJ SOLN: 4.0000 mg | Freq: Four times a day (QID) | INTRAMUSCULAR | Status: DC | PRN

## 2017-05-01 MED ORDER — SUCCINYLCHOLINE CHLORIDE 20 MG/ML IJ SOLN
100.0000 mg | Freq: Once | INTRAMUSCULAR | Status: AC
  Administered 2017-05-01: 100 mg via INTRAVENOUS

## 2017-05-01 MED ORDER — PROPOFOL 1000 MG/100ML IV EMUL
5.0000 ug/kg/min | INTRAVENOUS | Status: DC
  Administered 2017-05-01: 5 ug/kg/min via INTRAVENOUS

## 2017-05-01 MED ORDER — FENTANYL CITRATE (PF) 100 MCG/2ML IJ SOLN: 100.0000 ug | Freq: Once | INTRAMUSCULAR | Status: DC

## 2017-05-01 MED ORDER — SODIUM CHLORIDE 0.9 % IV SOLN
3.0000 g | Freq: Two times a day (BID) | INTRAVENOUS | Status: DC
  Administered 2017-05-01: 3 g via INTRAVENOUS
  Filled 2017-05-01 (×2): qty 3

## 2017-05-01 MED ORDER — ORAL CARE MOUTH RINSE
15.0000 mL | OROMUCOSAL | Status: DC
  Administered 2017-05-01 (×2): 15 mL via OROMUCOSAL

## 2017-05-01 MED ORDER — SODIUM CHLORIDE 0.9 % IV BOLUS
500.0000 mL | Freq: Once | INTRAVENOUS | Status: AC
  Administered 2017-05-01: 500 mL via INTRAVENOUS

## 2017-05-01 MED ORDER — PANTOPRAZOLE SODIUM 40 MG IV SOLR
40.0000 mg | Freq: Two times a day (BID) | INTRAVENOUS | Status: DC
  Administered 2017-05-01 – 2017-05-04 (×7): 40 mg via INTRAVENOUS
  Filled 2017-05-01 (×7): qty 40

## 2017-05-01 MED ORDER — DILTIAZEM HCL 25 MG/5ML IV SOLN
10.0000 mg | Freq: Once | INTRAVENOUS | Status: DC
  Filled 2017-05-01: qty 5

## 2017-05-01 MED ORDER — ACETAMINOPHEN 650 MG RE SUPP
650.0000 mg | Freq: Four times a day (QID) | RECTAL | Status: DC | PRN
  Administered 2017-05-01: 650 mg via RECTAL
  Filled 2017-05-01: qty 1

## 2017-05-01 MED ORDER — KETAMINE HCL 10 MG/ML IJ SOLN: 100.0000 mg | Freq: Once | INTRAMUSCULAR | Status: DC

## 2017-05-01 NOTE — Progress Notes (Signed)
Extubated without complications per MD order 

## 2017-05-01 NOTE — Op Note (Signed)
[facility] Gastroenterology Patient Name: Cristela BlueFrances Ebey Procedure Date: 05/01/2017 11:06 AM MRN: [id number] Account #: [id number] Date of Birth: 03/17/1924 Admit Type: Inpatient Age: 84 [facility] END[facility] ROOM 2 Gender: Female Note Status: Finalized Procedure:            Upper GI endoscopy Providers:            Octavia Mottola B. Maximino Greenlandahiliani MD, MD Referring MD:         Clydie Braunavid Fitzgerald (Referring MD) Medicines:            General Anesthesia Complications:        No immediate complications. Procedure:            Pre-Anesthesia Assessment:                       - The risks and benefits of the procedure and the                        sedation options and risks were discussed with the                        patient. All questions were answered and informed                        consent was obtained.                       - Patient identification and proposed procedure were                        verified prior to the procedure.                       - ASA Grade Assessment: III - A patient with severe                        systemic disease.                       After obtaining informed consent, the endoscope was                        passed under direct vision. Throughout the procedure,                        the patient's blood pressure, pulse, and oxygen                        saturations were monitored continuously. The Endoscope                        was introduced through the mouth, and advanced to the                        third part of duodenum. The upper GI endoscopy was                        accomplished with ease. The patient tolerated the                        procedure well. Findings:      The  examined esophagus was normal.      Hematin (altered blood/coffee-ground-like material) was found in the       entire examined stomach. Lavage of the area was performed using a large       amount of sterile water, resulting in clearance with adequate   visualization.      A medium amount of food (residue) was found in the gastric fundus.       Removal of food was accomplished. It was removed with snare and left in       the antrum, after examination of the antrum, to allow visulization of       the fundus.      Clotted blood was found in the gastric fundus.      Red blood was found in the cardia. For hemostasis, two hemostatic clips       were successfully placed. There was no bleeding at the end of the       procedure. This area showed area of red blood oozing from the site with       no lesions underneath the area when washed. Significance of the lesion       is unclear and it is unclear if this area caused her GI Bleed.      A single localized spot with no bleeding and no stigmata of recent       bleeding was found in the gastric fundus. For hemostasis, two hemostatic       clips were successfully placed. There was no bleeding at the end of the       procedure. This area was not bleeding when seen but appeared to be a       circular raised red lesion. It did not appear to be an AVM. This could       have been the site of a previous Dieulafoy lesion.      Hematin (altered blood/coffee-ground-like material) was found in the       entire duodenum.      There is no endoscopic evidence of mucosal abnormalities or ulceration       in the entire examined duodenum.      NG tube was removed as it was interfering with scope maneuvering. In       addition no contents and no active bleeding was present in the stomach       to indicate NG tube suctioning at this time. Impression:           - Normal esophagus.                       - Hematin (altered blood/coffee-ground-like material)                        in the entire stomach.                       - Clotted blood in the gastric fundus.                       - Red blood in the cardia. Clips were placed. (This                        area showed area of red blood oozing from the site with  no lesions underneath the area when washed.                        Significance of the lesion is unclear and it is unclear                        if this area caused her GI Bleed.)                       - A single spot with no bleeding in the stomach. Clips                        were placed. (This area was not bleeding when seen but                        appeared to be a circular raised red lesion. It did not                        appear to be an AVM. This could have been the site of a                        previous Dieulafoy lesion.)                       - Blood in the entire examined duodenum.                       - No specimens collected. Recommendation:       - Continue Serial CBCs and transfuse PRN                       - Stop NSAID use (like Ibuprofen, Aleeve, Motrin,                        Advil, Meloxicam, Goodie Powder, BC powder etc.) except                        for Aspirin if medically indicated by PCP or cardiology                       - Use Protonix (pantoprazole) 40 mg IV BID.                       - Observe patient's clinical course.                       - The findings and recommendations were discussed with                        the patient's family.                       - The findings and recommendations were discussed with                        the referring physician.                       - If patient has active bleeding repeat EGD can be  done. Please page GI at that time. Procedure Code(s):    --- Professional ---                       [phone], 59, Esophagogastroduodenoscopy, flexible,                        transoral; with control of bleeding, any method                       43247, Esophagogastroduodenoscopy, flexible, transoral;                        with removal of foreign body(s) Diagnosis Code(s):    --- Professional ---                       K92.2, Gastrointestinal hemorrhage, unspecified                        K31.89, Other diseases of stomach and duodenum CPT copyright 2017 American Medical Association. All rights reserved. The codes documented in this report are preliminary and upon coder review may  be revised to meet current compliance requirements.  Melodie Bouillon, MD Michel Bickers B. Maximino Greenland MD, MD 05/01/2017 12:32:18 PM This report has been signed electronically. Number of Addenda: 0 Note Initiated On: 05/01/2017 11:06 AM Estimated Blood Loss: Estimated blood loss: none.      [facility]

## 2017-05-01 NOTE — Progress Notes (Signed)
Pharmacy Antibiotic Note  Rita SnareFrances L Vang is a 84 y.o. female admitted on 05/01/2017 with aspiration pneumonia.  Pharmacy has been consulted for Unasyn dosing.  Plan: Unasyn 3 grams q 12 hours ordered.  Height: 5\' 2"  (157.5 cm) Weight: 127 lb 10.3 oz (57.9 kg) IBW/kg (Calculated) : 50.1  Temp (24hrs), Avg:95.7 F (35.4 C), Min:94.6 F (34.8 C), Max:96.8 F (36 C)  Recent Labs  Lab 05/01/17 0225  WBC 24.2*  CREATININE 1.10*  LATICACIDVEN 7.6*    Estimated Creatinine Clearance: 26.9 mL/min (A) (by C-G formula based on SCr of 1.1 mg/dL (H)).    Allergies  Allergen Reactions  . Clarithromycin     Other reaction(Vang): Abdominal Pain  . Oxycontin [Oxycodone Hcl] Other (See Comments)    Hallucinations   . Sulfamethoxazole-Trimethoprim     Other reaction(Vang): Other (See Comments) Chest pain    Antimicrobials this admission: Unasyn 4/18  >>    >>   Dose adjustments this admission:  Microbiology results: 4/18 MRSA PCR: pending  Thank you for allowing pharmacy to be a part of this patient'Vang care.  Rita Vang 05/01/2017 5:25 AM

## 2017-05-01 NOTE — ED Triage Notes (Signed)
Patient to ED via [facility]. Per EMS, they were called out for patient vomiting blood. Per family, patient came home from rehab yesterday for respiratory infection. Per EMS, there was blood covering floor with large clots seen. Patient with decreased responsiveness upon arrival. MD at bedside upon patient arrival.

## 2017-05-01 NOTE — Progress Notes (Signed)
Order for cardizem placed in error.

## 2017-05-01 NOTE — Consult Note (Signed)
Rita Bouillon, MD [address], [place], [place], [address] [address], [place], [place], [address] Phone: [phone]  Fax: [phone]  Consultation  Referring Provider:     Dr. Elpidio Anis Primary Care Physician:  Rita Sell, MD Reason for Consultation:     Hematemesis  Date of Admission:  05/01/2017 Date of Consultation:  05/01/2017         HPI:   Rita Vang is a 84 y.o. female who started having, multiple episodes of hematemesis, at about 1 AM this morning.  Emesis consisted of bright red blood.  Patient had to be intubated in the ER due to continued emesis, to protect her airway.  History obtained from previous chart, and son at bedside.  Son reports patient had been complaining of abdominal pain for the past 2 days.  No further qualification of abdominal pain available, since patient is intubated and sedated.  No previous history of GI bleed.  [name] on aspirin 325 mg daily.  Son denies that she takes any other NSAIDs.  Hemoglobin was 8.6 on presentation, and was around 11-3 weeks ago.  Due to her continued hematemesis, she received 3 units PRBCs, and hemoglobin this morning is around 11.    Patient was hospitalized in February 2019, due to Community acquired pneumonia and sepsis.  Past Medical History:  Diagnosis Date  . Anxiety   . Anxiety disorder    unspecified  . Cerebral infarction (HCC)   . CVA (cerebral vascular accident) (HCC)   . Dementia   . Depression   . GERD (gastroesophageal reflux disease)   . History of recurrent UTIs   . HLD (hyperlipidemia)   . HTN (hypertension)   . Vertigo    syncope    Past Surgical History:  Procedure Laterality Date  . CARPAL TUNNEL RELEASE Right 1995   endoscopic   . CARPAL TUNNEL RELEASE Left 04/30/2013   endoscopic , Dr. Rosita Kea   . CATARACT EXTRACTION Right   . CHOLECYSTECTOMY    . ELBOW SURGERY Left   . ERCP W/ SPHINCTEROTOMY AND BALLOON DILATION  2008   and endoscopy  .  ORIF DISTAL RADIUS FRACTURE Right 2002  . TOTAL KNEE ARTHROPLASTY Left 11/23/2007  . TRIGGER FINGER RELEASE  04/30/2013   third digit, incision tendon sheath for trigger finger    Prior to Admission medications   Medication Sig Start Date End Date Taking? Authorizing Provider  alum & mag hydroxide-simeth (MAALOX/MYLANTA) 200-200-20 MG/5ML suspension Take 30 mLs by mouth every 6 (six) hours as needed for indigestion or heartburn. 03/12/17   Ramonita Lab, MD  Amino Acids-Protein Hydrolys (FEEDING SUPPLEMENT, PRO-STAT SUGAR FREE 64,) LIQD Take 30 mLs by mouth 2 (two) times daily between meals.     [provider]  aspirin 325 MG tablet Take 1 tablet (325 mg total) by mouth daily. 08/24/14   Gale Journey, MD  bisacodyl (DULCOLAX) 10 MG suppository Place 10 mg rectally daily as needed.    [provider]  cetirizine (ZYRTEC) 10 MG tablet Take 10 mg by mouth daily as needed for allergies.    [provider]  cholecalciferol (VITAMIN D) 1000 UNITS tablet Take 1,000 Units by mouth daily.    [provider]  citalopram (CELEXA) 20 MG tablet Take 1 tablet by mouth daily.  12/30/13   [provider]  cyanocobalamin 1000 MCG tablet Take 2,000 mcg by mouth daily. 2 tabs    [provider]  donepezil (ARICEPT)  10 MG tablet Take 1 tablet by mouth at bedtime.  02/02/14   [provider]  fluticasone (FLONASE) 50 MCG/ACT nasal spray Place 1 spray into both nostrils daily.  02/24/14   [provider]  Fluticasone-Salmeterol (ADVAIR) 100-50 MCG/DOSE AEPB Inhale 1 puff into the lungs 2 (two) times daily.     [provider]  furosemide (LASIX) 20 MG tablet Take 1 tablet (20 mg total) by mouth daily. 03/13/17   Ramonita Lab, MD  galantamine (RAZADYNE) 4 MG tablet Take 4 mg by mouth 2 (two) times daily with a meal.     [provider]  Glucosamine-Chondroit-Vit C-Mn (GLUCOSAMINE 1500 COMPLEX PO) Take 1 capsule by mouth daily.     [provider]  glycopyrrolate (ROBINUL) 1 MG tablet Take 1 mg by mouth daily.     [provider]  guaiFENesin-dextromethorphan (ROBITUSSIN DM) 100-10 MG/5ML syrup Take 5 mLs by mouth every 4 (four) hours as needed for cough. 03/12/17   Gouru, Deanna Artis, MD  ipratropium (ATROVENT) 0.02 % nebulizer solution Take 2.5 mLs (0.5 mg total) by nebulization 3 (three) times daily. 03/12/17   Gouru, Deanna Artis, MD  levalbuterol (XOPENEX) 1.25 MG/0.5ML nebulizer solution Take 1.25 mg by nebulization every 4 (four) hours as needed for wheezing or shortness of breath. 03/12/17   Gouru, Deanna Artis, MD  levalbuterol (XOPENEX) 1.25 MG/0.5ML nebulizer solution Take 1.25 mg by nebulization 3 (three) times daily. 03/12/17   Ramonita Lab, MD  Magnesium Oxide 250 MG TABS Take 1 tablet by mouth daily.    [provider]  meclizine (ANTIVERT) 25 MG tablet Take 25 mg by mouth daily as needed for dizziness.    [provider]  Multiple Vitamins-Minerals (I-VITE PROTECT PO) Take 1 tablet by mouth daily.    [provider]  Nutritional Supplements (ENSURE ENLIVE PO) Take 1 Bottle by mouth 2 (two) times daily between meals.    [provider]  polyethylene glycol (MIRALAX) packet Take 17 g by mouth as needed.     [provider]  potassium chloride SA (K-DUR,KLOR-CON) 20 MEQ tablet Take 20 mEq by mouth daily.    [provider]  QUEtiapine (SEROQUEL) 25 MG tablet Take 25 mg by mouth at bedtime.    [provider]  sennosides-docusate sodium (SENOKOT-S) 8.6-50 MG tablet Take 1 tablet by mouth 2 (two) times daily.    [provider]  sodium chloride (OCEAN) 0.65 % nasal spray Place 2 sprays into the nose as needed. Into each nostril for congestion, dry sinuses, irritation. May keep at bedside.    [provider]  sodium phosphate (FLEET) 7-19 GM/118ML ENEM Place 1 enema rectally every three (3) days as needed for severe constipation. Constipation  not relieved by Milk of Magnesia or Bisacodyl Suppository.    [provider]  sorbitol 70 % solution Take 30 mLs by mouth every 2 (two) hours as needed. Give 30 mL po Q 2 hours until large BM. Then may change order to prn- for constipation/ impaction    [provider]  traMADol (ULTRAM) 50 MG tablet Take by mouth 2 (two) times daily. pain. Hold for sedation    [provider]  traMADol (ULTRAM) 50 MG tablet Take 50 mg by mouth every 4 (four) hours as needed.    [provider]  valACYclovir (VALTREX) 500 MG tablet Take 500 mg by mouth daily.    [provider]    Family History  Problem Relation Age of Onset  . Heart  attack Father   . Alzheimer's disease Mother      Social History   Tobacco Use  . Smoking status: Former Smoker    Packs/day: 1.00    Types: Cigarettes  . Smokeless tobacco: Never Used  . Tobacco comment: quit at age 89  Substance Use Topics  . Alcohol use: No  . Drug use: Not on file    Allergies as of 05/01/2017 - Review Complete 05/01/2017  Allergen Reaction Noted  . Clarithromycin  06/02/2013  . Oxycontin [oxycodone hcl] Other (See Comments) 03/06/2017  . Sulfamethoxazole-trimethoprim  06/02/2013    Review of Systems:    All systems reviewed and negative except where noted in HPI.   Physical Exam:  Vital signs in last 24 hours: Vitals:   05/01/17 0500 05/01/17 0600 05/01/17 0700 05/01/17 0800  BP: 107/77 98/69 (!) 109/59 128/69  Pulse: 96 75 78 76  Resp: 16 19 16 18   Temp: (!) 97.2 F (36.2 C) 98.6 F (37 C) 98.8 F (37.1 C) 98.6 F (37 C)  TempSrc:      SpO2: 100% 100% 100% 99%  Weight:      Height:         General: intubated and sedated Head:  Normocephalic and atraumatic. Eyes:   No icterus.   Conjunctiva pink. Ears: External inspection, normal Neck:  Supple; no masses or thyroidomegaly Lungs: Respirations even and unlabored. Lungs clear to auscultation bilaterally.   No wheezes, crackles, or  rhonchi.  Abdomen:  Soft, nondistended, nontender. Normal bowel sounds. No appreciable masses or hepatomegaly.  No rebound or guarding.  Neurologic:  grossly normal neurologically. Skin:  Intact without significant lesions or rashes. Cervical Nodes:  No significant cervical adenopathy.   LAB RESULTS: Recent Labs    05/01/17 0225 05/01/17 0312 05/01/17 0613  WBC 24.2*  --   --   HGB 8.6* 11.1* 11.7*  HCT 27.1* 33.9* 34.8*  PLT 197 135* 124*   BMET Recent Labs    05/01/17 0225  NA 142  K 4.0  CL 110  CO2 20*  GLUCOSE 245*  BUN 29*  CREATININE 1.10*  CALCIUM 8.3*   LFT Recent Labs    05/01/17 0225  PROT 4.7*  ALBUMIN 2.6*  AST 34  ALT 13*  ALKPHOS 95  BILITOT 0.5   PT/INR Recent Labs    05/01/17 0225 05/01/17 0312  LABPROT 15.7* 15.9*  INR 1.26 1.28    STUDIES: Dg Abdomen 1 View  Result Date: 05/01/2017 CLINICAL DATA:  Orogastric tube placement. EXAM: ABDOMEN - 1 VIEW COMPARISON:  CT of the abdomen and pelvis from 11/19/2012 FINDINGS: The patient's enteric tube is noted extending overlying the body of the stomach. The visualized bowel gas pattern is grossly unremarkable. Clips are noted at the right upper quadrant, reflecting prior cholecystectomy. Mild bibasilar atelectasis is noted. Degenerative change is noted along the lower thoracic and upper lumbar spine. IMPRESSION: Enteric tube noted extending overlying the body of the stomach. Electronically Signed   By: Roanna RaiderJeffery  Chang M.D.   On: 05/01/2017 03:20   Dg Chest Portable 1 View  Result Date: 05/01/2017 CLINICAL DATA:  Endotracheal tube placement. EXAM: PORTABLE CHEST 1 VIEW COMPARISON:  Chest radiograph performed earlier today at 2:13 a.m. FINDINGS: The patient's endotracheal tube is seen ending 3 cm above the carina. An enteric tube is noted extending below the diaphragm. Mild vascular congestion is noted. Mild bibasilar opacities likely reflect atelectasis. The previously suggested pleural effusion is not  well seen. No pneumothorax is  identified. The cardiomediastinal silhouette is borderline normal in size. No acute osseous abnormalities are identified. There is chronic deformity of the proximal right humerus. IMPRESSION: 1. Endotracheal tube seen ending 3 cm above the carina. 2. Mild vascular congestion noted; mild bibasilar airspace opacities likely reflect atelectasis. Electronically Signed   By: Roanna Raider M.D.   On: 05/01/2017 03:19   Dg Chest Port 1 View  Result Date: 05/01/2017 CLINICAL DATA:  Acute onset of hematemesis. Decreased responsiveness. Shortness of breath. EXAM: PORTABLE CHEST 1 VIEW COMPARISON:  Chest radiograph performed 03/09/2017 FINDINGS: The lungs are well-aerated. A small right pleural effusion is noted. There is no evidence of pneumothorax. The cardiomediastinal silhouette is borderline enlarged. No acute osseous abnormalities are seen. Clips are noted within the right upper quadrant, reflecting prior cholecystectomy. IMPRESSION: Small right pleural effusion noted; lungs otherwise clear. Borderline cardiomegaly. Electronically Signed   By: Roanna Raider M.D.   On: 05/01/2017 02:47      Impression / Plan:   TAKEYLA MILLION is a 84 y.o. y/o female with hematemesis, and aspirin 325 mg use at home, currently intubated and sedated for airway protection  Possible peptic ulcer disease from NSAID use Protonix 40 IV twice daily Continue serial CBCs and transfuse. Avoid NSAIDs EGD today NG output given ongoing bleeding,, with about 100 mL of dark output.  NG tube, with bright red blood.  Given ongoing bleeding, benefits of procedure would outweigh risks at this time The above was discussed extensively with the patient's son, and he is agreeable with proceeding with the procedure  Would recommend primary team to determine need for ongoing NSAIDs long-term, especially high-dose aspirin, versus low-dose aspirin, given acute GI bleed at this time  I have discussed  alternative options, risks & benefits,  which include, but are not limited to, bleeding, infection, perforation,respiratory complication & drug reaction.  The patient/family agrees with this plan & written consent will be obtained.     Thank you for involving me in the care of this patient.      LOS: 0 days   Pasty Spillers, MD  05/01/2017, 9:16 AM

## 2017-05-01 NOTE — ED Notes (Signed)
Recollect for type and screen sent to lab  

## 2017-05-01 NOTE — Consult Note (Signed)
Name: Rita Vang MRN: [id number] DOB: [birth year]    ADMISSION DATE:  05/01/2017 CONSULTATION DATE: 05/01/2017  REFERRING MD : Dr. Marcille Blanco   CHIEF COMPLAINT: Hematemesis   BRIEF PATIENT DESCRIPTION:  84 yo female admitted with massive upper GI bleed and acute hypoxic respiratory failure presumed secondary to aspiration requiring mechanical intubation   SIGNIFICANT EVENTS  04/18-Pt admitted to ICU with upper GI bleed mechanically intubated   STUDIES:  None   HISTORY OF PRESENT ILLNESS:   This is a 84 yo female with a PMH Vertigo, HTN, CKD Stage III, Protein Calorie Malnutrition, Hyperlipidemia, Recurrent UTI's, GERD, Depression, Dementia, CVA, Cerebral Infarction, and Anxiety. She presented to [facility] ER on 04/18 via EMS from home with hematemesis with large clots and decreased responsiveness.  She was discharged from the [facility] after receiving rehab due to generalized weakness and deconditioning following hospitalization secondary to pneumonia.  Upon EMS arrival at pts home her O2 sats were in the 60's on RA and pt somnolent, therefore she was placed on NRB.  In the ER pt required mechanical intubation due to hypoxia suspected secondary to aspiration. Massive transfusion protocol initiated, pt received 2 units of platelet pheresis, tranexamic acid, protonix, and on call gastroenterologist contacted by ER physician. Lab results revealed BUN 29, creatinine 1.10, lactic acid 7.6, wbc 24.2, hgb 8.6, and platelets 197.  She was subsequently admitted to ICU by hospitalist team for further workup and treatment PCCM consulted for vent management.   PAST MEDICAL HISTORY :   has a past medical history of Anxiety, Anxiety disorder, Cerebral infarction ([place]), CVA (cerebral vascular accident) ([place]), Dementia, Depression, GERD (gastroesophageal reflux disease), History of recurrent UTIs, HLD (hyperlipidemia), HTN (hypertension), and Vertigo.  has a past surgical history that includes  Cholecystectomy; Carpal tunnel release (Right, 1995); ERCP w/ sphincterotomy and balloon dilation (2008); ORIF distal radius fracture (Right, 2002); Elbow surgery (Left); Carpal tunnel release (Left, 04/30/2013); Trigger finger release (04/30/2013); Cataract extraction (Right); and Total knee arthroplasty (Left, 11/23/2007). Prior to Admission medications   Medication Sig Start Date End Date Taking? Authorizing Provider  alum & mag hydroxide-simeth (MAALOX/MYLANTA) 200-200-20 MG/5ML suspension Take 30 mLs by mouth every 6 (six) hours as needed for indigestion or heartburn. 03/12/17   Nicholes Mango, MD  Amino Acids-Protein Hydrolys (FEEDING SUPPLEMENT, PRO-STAT SUGAR FREE 64,) LIQD Take 30 mLs by mouth 2 (two) times daily between meals.     [provider]  aspirin 325 MG tablet Take 1 tablet (325 mg total) by mouth daily. 08/24/14   Aldean Jewett, MD  bisacodyl (DULCOLAX) 10 MG suppository Place 10 mg rectally daily as needed.    [provider]  cetirizine (ZYRTEC) 10 MG tablet Take 10 mg by mouth daily as needed for allergies.    [provider]  cholecalciferol (VITAMIN D) 1000 UNITS tablet Take 1,000 Units by mouth daily.    [provider]  citalopram (CELEXA) 20 MG tablet Take 1 tablet by mouth daily.  12/30/13   [provider]  cyanocobalamin 1000 MCG tablet Take 2,000 mcg by mouth daily. 2 tabs    [provider]  donepezil (ARICEPT) 10 MG tablet Take 1 tablet by mouth at bedtime.  02/02/14   [provider]  fluticasone (FLONASE) 50 MCG/ACT nasal spray Place 1 spray into both nostrils daily.  02/24/14   [provider]  Fluticasone-Salmeterol (ADVAIR) 100-50 MCG/DOSE AEPB Inhale 1 puff into the lungs 2 (two) times daily.     [provider]  furosemide (LASIX) 20 MG tablet Take 1 tablet (20 mg total) by mouth daily. 03/13/17   Nicholes Mango, MD  galantamine (RAZADYNE) 4 MG tablet Take 4 mg by mouth 2 (two) times  daily with a meal.     [provider]  Glucosamine-Chondroit-Vit C-Mn (GLUCOSAMINE 1500 COMPLEX PO) Take 1 capsule by mouth daily.    [provider]  glycopyrrolate (ROBINUL) 1 MG tablet Take 1 mg by mouth daily.     [provider]  guaiFENesin-dextromethorphan (ROBITUSSIN DM) 100-10 MG/5ML syrup Take 5 mLs by mouth every 4 (four) hours as needed for cough. 03/12/17   Gouru, Illene Silver, MD  ipratropium (ATROVENT) 0.02 % nebulizer solution Take 2.5 mLs (0.5 mg total) by nebulization 3 (three) times daily. 03/12/17   Gouru, Illene Silver, MD  levalbuterol (XOPENEX) 1.25 MG/0.5ML nebulizer solution Take 1.25 mg by nebulization every 4 (four) hours as needed for wheezing or shortness of breath. 03/12/17   Gouru, Illene Silver, MD  levalbuterol (XOPENEX) 1.25 MG/0.5ML nebulizer solution Take 1.25 mg by nebulization 3 (three) times daily. 03/12/17   Nicholes Mango, MD  Magnesium Oxide 250 MG TABS Take 1 tablet by mouth daily.    [provider]  meclizine (ANTIVERT) 25 MG tablet Take 25 mg by mouth daily as needed for dizziness.    [provider]  Multiple Vitamins-Minerals (I-VITE PROTECT PO) Take 1 tablet by mouth daily.    [provider]  Nutritional Supplements (ENSURE ENLIVE PO) Take 1 Bottle by mouth 2 (two) times daily between meals.    [provider]  polyethylene glycol (MIRALAX) packet Take 17 g by mouth as needed.     [provider]  potassium chloride SA (K-DUR,KLOR-CON) 20 MEQ tablet Take 20 mEq by mouth daily.    [provider]  QUEtiapine (SEROQUEL) 25 MG tablet Take 25 mg by mouth at bedtime.    [provider]  sennosides-docusate sodium (SENOKOT-S) 8.6-50 MG tablet Take 1 tablet by mouth 2 (two) times daily.    [provider]  sodium chloride (OCEAN) 0.65 % nasal spray Place 2 sprays into the nose as needed. Into each nostril for congestion, dry sinuses, irritation. May keep at bedside.    [provider]  sodium phosphate (FLEET) 7-19 GM/118ML ENEM Place 1 enema rectally every three (3) days as needed for severe constipation. Constipation not relieved by Milk of Magnesia or Bisacodyl Suppository.    [provider]  sorbitol 70 % solution Take 30 mLs by mouth every 2 (two) hours as needed. Give 30 mL po Q 2 hours until large BM. Then may change order to prn- for constipation/ impaction    [provider]  traMADol (ULTRAM) 50 MG tablet Take by mouth 2 (two) times daily. pain. Hold for sedation    [provider]  traMADol (ULTRAM) 50 MG tablet Take 50 mg by mouth every 4 (four) hours as needed.    [provider]  valACYclovir (VALTREX) 500 MG tablet Take 500 mg by mouth daily.    [provider]   Allergies  Allergen Reactions  . Clarithromycin     Other reaction(s): Abdominal Pain  . Oxycontin [Oxycodone Hcl] Other (See Comments)    Hallucinations   . Sulfamethoxazole-Trimethoprim     Other reaction(s): Other (See Comments) Chest pain    FAMILY HISTORY:  family history includes Alzheimer's disease in her mother; Heart attack in her father. SOCIAL HISTORY:  reports that she has quit smoking. Her smoking  use included cigarettes. She smoked 1.00 pack per day. She has never used smokeless tobacco. She reports that she does not drink alcohol.  REVIEW OF SYSTEMS:   Unable to assess pt intubated   SUBJECTIVE:  Unable to assess pt intubated  VITAL SIGNS: Pulse Rate:  [116] 116 (04/18 0223) Resp:  [24] 24 (04/18 0223) BP: (75)/(61) 75/61 (04/18 0223) SpO2:  [84 %-95 %] 95 % (04/18 0245) FiO2 (%):  [40 %] 40 % (04/18 0245) Weight:  [59 kg (130 lb)] 59 kg (130 lb) (04/18 0224)  PHYSICAL EXAMINATION: General: acutely ill appearing female, mechanically intubated Neuro: follows commands, PERRL  HEENT: supple, no JVD  Cardiovascular: sinus tach, no M/R/G Lungs: rhonchi throughout, even, non labored  Abdomen: +BS x4, obese,  distended, taut Musculoskeletal: normal bulk and tone, no edema  Skin: intact no rashes or lesions   Recent Labs  Lab 05/01/17 0225  NA 142  K 4.0  CL 110  CO2 20*  BUN 29*  CREATININE 1.10*  GLUCOSE 245*   Recent Labs  Lab 05/01/17 0225  HGB 8.6*  HCT 27.1*  WBC 24.2*  PLT 197   Dg Abdomen 1 View  Result Date: 05/01/2017 CLINICAL DATA:  Orogastric tube placement. EXAM: ABDOMEN - 1 VIEW COMPARISON:  CT of the abdomen and pelvis from 11/19/2012 FINDINGS: The patient's enteric tube is noted extending overlying the body of the stomach. The visualized bowel gas pattern is grossly unremarkable. Clips are noted at the right upper quadrant, reflecting prior cholecystectomy. Mild bibasilar atelectasis is noted. Degenerative change is noted along the lower thoracic and upper lumbar spine. IMPRESSION: Enteric tube noted extending overlying the body of the stomach. Electronically Signed   By: Garald Balding M.D.   On: 05/01/2017 03:20   Dg Chest Portable 1 View  Result Date: 05/01/2017 CLINICAL DATA:  Endotracheal tube placement. EXAM: PORTABLE CHEST 1 VIEW COMPARISON:  Chest radiograph performed earlier today at 2:13 a.m. FINDINGS: The patient's endotracheal tube is seen ending 3 cm above the carina. An enteric tube is noted extending below the diaphragm. Mild vascular congestion is noted. Mild bibasilar opacities likely reflect atelectasis. The previously suggested pleural effusion is not well seen. No pneumothorax is identified. The cardiomediastinal silhouette is borderline normal in size. No acute osseous abnormalities are identified. There is chronic deformity of the proximal right humerus. IMPRESSION: 1. Endotracheal tube seen ending 3 cm above the carina. 2. Mild vascular congestion noted; mild bibasilar airspace opacities likely reflect atelectasis. Electronically Signed   By: Garald Balding M.D.   On: 05/01/2017 03:19   Dg Chest Port 1 View  Result Date: 05/01/2017 CLINICAL DATA:   Acute onset of hematemesis. Decreased responsiveness. Shortness of breath. EXAM: PORTABLE CHEST 1 VIEW COMPARISON:  Chest radiograph performed 03/09/2017 FINDINGS: The lungs are well-aerated. A small right pleural effusion is noted. There is no evidence of pneumothorax. The cardiomediastinal silhouette is borderline enlarged. No acute osseous abnormalities are seen. Clips are noted within the right upper quadrant, reflecting prior cholecystectomy. IMPRESSION: Small right pleural effusion noted; lungs otherwise clear. Borderline cardiomegaly. Electronically Signed   By: Garald Balding M.D.   On: 05/01/2017 02:47    ASSESSMENT / PLAN: Upper GI Bleed  Acute hypoxic respiratory presumed secondary to aspiration  Mechanical Intubation  Acute on chronic renal failure Lactic acidosis  Acute blood loss anemia  Hx: Dementia, CVA, Cerebral Infarction, CVA, GERD, and HTN  P: Full vent support for now-vent setting reviewed and established SBT's once all parameters met  Prn bronchodilator therapy VAP Bundle Implemented Repeat CXR in am  Continuous telemetry monitoring  Fluid resuscitation to maintain map >65 if unable to improve bp with fluid resuscitation will start levophed gtt Trend BMP  Replace electrolytes as indicated  Monitor UOP  Trend lactic acid  Serial H&H q6hrs  Monitor for s/sx of bleeding  Transfuse for hgb <7 and/or active s/sx of bleeding  OG tube LIS IV protonix  SCD's for VTE prophylaxis, avoid chemical prophylaxis  GI consulted appreciate input  Trend WBC and monitor fever curve Will start unasyn Maintain RASS goal 0 to -1 Fentanyl gtt and prn versed to maintain RASS goal and for pain management WUA daily   Marda Stalker, [facility] Pager [phone] (please enter 7 digits) PCCM Consult Pager [phone] (please enter 7 digits)

## 2017-05-01 NOTE — Progress Notes (Signed)
Patient had maroon colored BM, Dr. Maximino Greenlandahiliani notified and does not want stool sample. Patient hgb stable. Patient extubated without any complications, on RA. Trudee KusterBrandi R [place]

## 2017-05-01 NOTE — Progress Notes (Signed)
Pharmacy Antibiotic Note  Rita SnareFrances L Vang is a 84 y.o. female admitted on 05/01/2017 with aspiration pneumonia.  Pharmacy has been consulted for Unasyn dosing.  Plan: Transition patient to Unasyn 3g IV Q6hr.    Height: 5\' 2"  (157.5 cm) Weight: 127 lb 10.3 oz (57.9 kg) IBW/kg (Calculated) : 50.1  Temp (24hrs), Avg:99.2 F (37.3 C), Min:94.6 F (34.8 C), Max:100.9 F (38.3 C)  Recent Labs  Lab 05/01/17 0225 05/01/17 0613 05/01/17 1017 05/01/17 1020  WBC 24.2*  --   --  22.9*  CREATININE 1.10*  --  0.93  --   LATICACIDVEN 7.6* 2.1*  --   --     Estimated Creatinine Clearance: 31.8 mL/min (by C-G formula based on SCr of 0.93 mg/dL).    Allergies  Allergen Reactions  . Clarithromycin     Other reaction(s): Abdominal Pain  . Oxycontin [Oxycodone Hcl] Other (See Comments)    Hallucinations   . Sulfamethoxazole-Trimethoprim     Other reaction(s): Other (See Comments) Chest pain    Antimicrobials this admission: Unasyn 4/18  >>     Dose adjustments this admission: 4/18 Unasyn transitioned to 3g IV Q6Hr  Microbiology results: 4/18 MRSA PCR: negative   Thank you for allowing pharmacy to be a part of this patient's care.  Macarius Ruark L 05/01/2017 9:51 PM

## 2017-05-01 NOTE — ED Provider Notes (Signed)
[facility] Emergency Department Provider Note  ____________________________________________   First MD Initiated Contact with Patient 05/01/17 [phone]     (approximate)  I have reviewed the triage vital signs and the nursing notes.   HISTORY  Chief Complaint Hematemesis  History limited by the patient's clinical condition  HPI Rita Vang is a 84 y.o. female who comes to the emergency department via EMS after vomiting a large amount of bright red blood.  When EMS came on scene the patient was saturating in the 60s on room air although came up with 15 L of nonrebreather.  She had normal blood pressure in route but was tachycardic to the 1 teens.  She had an elevated blood sugar and was initially somnolent although her mental status is quickly coming around.  Past Medical History:  Diagnosis Date  . Anxiety   . Anxiety disorder    unspecified  . Cerebral infarction ([place])   . CVA (cerebral vascular accident) ([place])   . Dementia   . Depression   . GERD (gastroesophageal reflux disease)   . History of recurrent UTIs   . HLD (hyperlipidemia)   . HTN (hypertension)   . Vertigo    syncope    Patient Active Problem List   Diagnosis Date Noted  . GI bleed 05/01/2017  . Herpes genitalis 04/04/2017  . Protein-calorie malnutrition ([place]) 04/04/2017  . Hypomagnesemia 04/04/2017  . Vitamin B12 deficiency 04/04/2017  . Generalized weakness 04/01/2017  . Pressure injury of skin 03/07/2017  . Sepsis ([place]) 03/06/2017  . CAP (community acquired pneumonia) 03/06/2017  . HTN (hypertension) 03/06/2017  . Anxiety 03/06/2017  . HLD (hyperlipidemia) 03/06/2017  . GERD (gastroesophageal reflux disease) 03/06/2017  . CKD (chronic kidney disease), stage III ([place]) 03/06/2017  . CVA (cerebral infarction) 08/23/2014    Past Surgical History:  Procedure Laterality Date  . CARPAL TUNNEL RELEASE Right 1995   endoscopic   . CARPAL TUNNEL RELEASE Left 04/30/2013   endoscopic , Dr. Rudene Christians   . CATARACT EXTRACTION Right   . CHOLECYSTECTOMY    . ELBOW SURGERY Left   . ERCP W/ SPHINCTEROTOMY AND BALLOON DILATION  2008   and endoscopy  . ORIF DISTAL RADIUS FRACTURE Right 2002  . TOTAL KNEE ARTHROPLASTY Left 11/23/2007  . TRIGGER FINGER RELEASE  04/30/2013   third digit, incision tendon sheath for trigger finger    Prior to Admission medications   Medication Sig Start Date End Date Taking? Authorizing Provider  alum & mag hydroxide-simeth (MAALOX/MYLANTA) 200-200-20 MG/5ML suspension Take 30 mLs by mouth every 6 (six) hours as needed for indigestion or heartburn. 03/12/17   Nicholes Mango, MD  Amino Acids-Protein Hydrolys (FEEDING SUPPLEMENT, PRO-STAT SUGAR FREE 64,) LIQD Take 30 mLs by mouth 2 (two) times daily between meals.     [provider]  aspirin 325 MG tablet Take 1 tablet (325 mg total) by mouth daily. 08/24/14   Aldean Jewett, MD  bisacodyl (DULCOLAX) 10 MG suppository Place 10 mg rectally daily as needed.    [provider]  cetirizine (ZYRTEC) 10 MG tablet Take 10 mg by mouth daily as needed for allergies.    [provider]  cholecalciferol (VITAMIN D) 1000 UNITS tablet Take 1,000 Units by mouth daily.    [provider]  citalopram (CELEXA) 20 MG tablet Take 1 tablet by mouth daily.  12/30/13   [provider]  cyanocobalamin 1000 MCG tablet Take 2,000 mcg by mouth daily. 2 tabs  [provider]  donepezil (ARICEPT) 10 MG tablet Take 1 tablet by mouth at bedtime.  02/02/14   [provider]  fluticasone (FLONASE) 50 MCG/ACT nasal spray Place 1 spray into both nostrils daily.  02/24/14   [provider]  Fluticasone-Salmeterol (ADVAIR) 100-50 MCG/DOSE AEPB Inhale 1 puff into the lungs 2 (two) times daily.     [provider]  furosemide (LASIX) 20 MG tablet Take 1 tablet (20 mg total) by mouth daily. 03/13/17   Nicholes Mango, MD  galantamine (RAZADYNE) 4 MG tablet  Take 4 mg by mouth 2 (two) times daily with a meal.     [provider]  Glucosamine-Chondroit-Vit C-Mn (GLUCOSAMINE 1500 COMPLEX PO) Take 1 capsule by mouth daily.    [provider]  glycopyrrolate (ROBINUL) 1 MG tablet Take 1 mg by mouth daily.     [provider]  guaiFENesin-dextromethorphan (ROBITUSSIN DM) 100-10 MG/5ML syrup Take 5 mLs by mouth every 4 (four) hours as needed for cough. 03/12/17   Gouru, Illene Silver, MD  ipratropium (ATROVENT) 0.02 % nebulizer solution Take 2.5 mLs (0.5 mg total) by nebulization 3 (three) times daily. 03/12/17   Gouru, Illene Silver, MD  levalbuterol (XOPENEX) 1.25 MG/0.5ML nebulizer solution Take 1.25 mg by nebulization every 4 (four) hours as needed for wheezing or shortness of breath. 03/12/17   Gouru, Illene Silver, MD  levalbuterol (XOPENEX) 1.25 MG/0.5ML nebulizer solution Take 1.25 mg by nebulization 3 (three) times daily. 03/12/17   Nicholes Mango, MD  Magnesium Oxide 250 MG TABS Take 1 tablet by mouth daily.    [provider]  meclizine (ANTIVERT) 25 MG tablet Take 25 mg by mouth daily as needed for dizziness.    [provider]  Multiple Vitamins-Minerals (I-VITE PROTECT PO) Take 1 tablet by mouth daily.    [provider]  Nutritional Supplements (ENSURE ENLIVE PO) Take 1 Bottle by mouth 2 (two) times daily between meals.    [provider]  polyethylene glycol (MIRALAX) packet Take 17 g by mouth as needed.     [provider]  potassium chloride SA (K-DUR,KLOR-CON) 20 MEQ tablet Take 20 mEq by mouth daily.    [provider]  QUEtiapine (SEROQUEL) 25 MG tablet Take 25 mg by mouth at bedtime.    [provider]  sennosides-docusate sodium (SENOKOT-S) 8.6-50 MG tablet Take 1 tablet by mouth 2 (two) times daily.    [provider]  sodium chloride (OCEAN) 0.65 % nasal spray Place 2 sprays into the nose as needed. Into each nostril for congestion, dry sinuses, irritation. May keep at  bedside.    [provider]  sodium phosphate (FLEET) 7-19 GM/118ML ENEM Place 1 enema rectally every three (3) days as needed for severe constipation. Constipation not relieved by Milk of Magnesia or Bisacodyl Suppository.    [provider]  sorbitol 70 % solution Take 30 mLs by mouth every 2 (two) hours as needed. Give 30 mL po Q 2 hours until large BM. Then may change order to prn- for constipation/ impaction    [provider]  traMADol (ULTRAM) 50 MG tablet Take by mouth 2 (two) times daily. pain. Hold for sedation    [provider]  traMADol (ULTRAM) 50 MG tablet Take 50 mg by mouth every 4 (four) hours as needed.    [provider]  valACYclovir (VALTREX) 500 MG tablet Take 500 mg by mouth daily.    [provider]    Allergies Clarithromycin; Oxycontin [oxycodone hcl];  and Sulfamethoxazole-trimethoprim  Family History  Problem Relation Age of Onset  . Heart attack Father   . Alzheimer's disease Mother     Social History Social History   Tobacco Use  . Smoking status: Former Smoker    Packs/day: 1.00    Types: Cigarettes  . Smokeless tobacco: Never Used  . Tobacco comment: quit at age 78  Substance Use Topics  . Alcohol use: No  . Drug use: Not on file    Review of Systems Level 5 exemption history limited by the patient's clinical condition  ____________________________________________   PHYSICAL EXAM:  VITAL SIGNS: ED Triage Vitals  Enc Vitals Group     BP      Pulse      Resp      Temp      Temp src      SpO2      Weight      Height      Head Circumference      Peak Flow      Pain Score      Pain Loc      Pain Edu?      Excl. in [place]?     Constitutional: Appears critically ill somewhat somnolent elevated respiratory rate large amount of bright red blood on her face and in her hair Eyes: PERRL EOMI. mid range and brisk Head: Atraumatic. Nose: No congestion/rhinnorhea. Mouth/Throat: No  trismus Neck: No stridor.   Cardiovascular: Tachycardic rate irregularly irregular Respiratory: Normal respiratory effort.  No retractions. Lungs CTAB and moving good air Gastrointestinal: Soft nontender Musculoskeletal: No lower extremity edema   Neurologic: . No gross focal neurologic deficits are appreciated. Skin:  Skin is warm, dry and intact. No rash noted. Psychiatric: Somewhat confused    ____________________________________________   DIFFERENTIAL includes but not limited to  Upper GI bleed, lower GI bleed, hemorrhagic shock, aspiration, pneumonia ____________________________________________   LABS (all labs ordered are listed, but only abnormal results are displayed)  Labs Reviewed  COMPREHENSIVE METABOLIC PANEL - Abnormal; Notable for the following components:      Result Value   CO2 20 (*)    Glucose, Bld 245 (*)    BUN 29 (*)    Creatinine, Ser 1.10 (*)    Calcium 8.3 (*)    Total Protein 4.7 (*)    Albumin 2.6 (*)    ALT 13 (*)    GFR calc non Af Amer 43 (*)    GFR calc Af Amer 50 (*)    All other components within normal limits  CBC WITH DIFFERENTIAL/PLATELET - Abnormal; Notable for the following components:   WBC 24.2 (*)    RBC 2.71 (*)    Hemoglobin 8.6 (*)    HCT 27.1 (*)    MCHC 31.7 (*)    RDW 15.2 (*)    Neutro Abs 19.7 (*)    Monocytes Absolute 1.0 (*)    Basophils Absolute 0.2 (*)    All other components within normal limits  LACTIC ACID, PLASMA - Abnormal; Notable for the following components:   Lactic Acid, Venous 7.6 (*)    All other components within normal limits  PROTIME-INR - Abnormal; Notable for the following components:   Prothrombin Time 15.7 (*)    All other components within normal limits  HEMOGLOBIN AND HEMATOCRIT, BLOOD - Abnormal; Notable for the following components:   Hemoglobin 11.1 (*)    HCT 33.9 (*)    All other components within normal limits  BLOOD GAS,  ARTERIAL - Abnormal; Notable for the following components:    pH, Arterial 7.20 (*)    pCO2 arterial 57 (*)    Acid-base deficit 6.7 (*)    All other components within normal limits  FIBRIN DERIVATIVES D-DIMER ([facility] ONLY) - Abnormal; Notable for the following components:   Fibrin derivatives D-dimer ([facility]) 4,320.43 (*)    All other components within normal limits  PLATELET COUNT - Abnormal; Notable for the following components:   Platelets 135 (*)    All other components within normal limits  PROTIME-INR - Abnormal; Notable for the following components:   Prothrombin Time 15.9 (*)    All other components within normal limits  GLUCOSE, CAPILLARY - Abnormal; Notable for the following components:   Glucose-Capillary 137 (*)    All other components within normal limits  MRSA PCR SCREENING  TROPONIN I  FIBRINOGEN  APTT  LACTIC ACID, PLASMA  TECHNOLOGIST SMEAR REVIEW  TSH  TYPE AND SCREEN  TYPE AND SCREEN  ABO/RH  PREPARE RBC (CROSSMATCH)  PREPARE PLATELET PHERESIS  MASSIVE TRANSFUSION PROTOCOL ORDER (BLOOD BANK NOTIFICATION)    Lab work reviewed by me with elevated lactic acid consistent with under resuscitation Her hemoglobin has dropped more than 3 points in the last 2 weeks __________________________________________  EKG  ED ECG REPORT I, Darel Hong, the attending physician, personally viewed and interpreted this ECG.  Date: 05/01/2017 EKG Time:  Rate: 122 Rhythm: Atrial flutter with rapid ventricular response QRS Axis: normal Intervals: Slightly prolonged QTC ST/T Wave abnormalities: normal Narrative Interpretation: no evidence of acute ischemia  ____________________________________________  RADIOLOGY  Chest x-ray reviewed by me after intubation with endotracheal tube in appropriate position ____________________________________________   PROCEDURES  Procedure(s) performed: Yes  .Critical Care Performed by: Darel Hong, MD Authorized by: Darel Hong, MD   Critical care provider statement:    Critical  care time (minutes):  45   Critical care time was exclusive of:  Separately billable procedures and treating other patients   Critical care was necessary to treat or prevent imminent or life-threatening deterioration of the following conditions:  Shock and respiratory failure   Critical care was time spent personally by me on the following activities:  Development of treatment plan with patient or surrogate, discussions with consultants, evaluation of patient's response to treatment, examination of patient, obtaining history from patient or surrogate, ordering and performing treatments and interventions, ordering and review of laboratory studies, ordering and review of radiographic studies, pulse oximetry, re-evaluation of patient's condition and review of old charts Procedure Name: Intubation Date/Time: 05/01/2017 2:55 AM Performed by: Darel Hong, MD Pre-anesthesia Checklist: Patient identified, Patient being monitored, Emergency Drugs available, Timeout performed and Suction available Oxygen Delivery Method: Nasal cannula Preoxygenation: Pre-oxygenation with 100% oxygen Induction Type: Rapid sequence Ventilation: Mask ventilation without difficulty Laryngoscope Size: Mac and 4 Grade View: Grade I Tube size: 7.5 mm Number of attempts: 1 Placement Confirmation: ETT inserted through vocal cords under direct vision,  CO2 detector and Breath sounds checked- equal and bilateral Secured at: 21 cm Tube secured with: ETT holder       Critical Care performed: yes  Observation: no ____________________________________________   INITIAL IMPRESSION / [place] / ED COURSE  Pertinent labs & imaging results that were available during my care of the patient were reviewed by me and considered in my medical decision making (see chart for details).  The patient arrives critically ill-appearing with a large amount of bright red blood on her hair on her chest and  on her close.  First  blood pressure is 70/40 so O+ blood will be emergently transfused.  She has coarse breath sounds and I have high clinical suspicion for aspiration.  She is saturating low without oxygen supplementation.  I discussed intubation with the patient and family at bedside and together we all agreed that this is the safest course of action.  I will resuscitate her prior to intubation however.     ----------------------------------------- 2:54 AM on 05/01/2017 -----------------------------------------  The patient was intubated for hypoxia and presumed aspiration as well as massive upper GI bleed.  Following the intubation I discussed with on-call gastroenterologist Dr. Marius Ditch who agrees with continued resuscitation and she will kindly come evaluate the patient.  I then discussed the case with the hospitalist Dr. Marcille Blanco who has graciously agreed to admit the patient to his service.  We are giving O+ blood now, lakelets are on their way for her aspirin use, Protonix for the upper GI bleed, tranexamic acid for the GI bleed as well.  The patient remains hypotensive following intubation and sedation will be challenging and we will continue to resuscitate with blood products rather than crystalloid. ____________________________________________  ----------------------------------------- 3:01 AM on 05/01/2017 -----------------------------------------  The patient remains hypotensive despite 2 units of blood.  I called blood bank for massive transfusion.  FINAL CLINICAL IMPRESSION(S) / ED DIAGNOSES  Final diagnoses:  Hemorrhagic shock ([place])  Upper GI bleed  Lactic acidosis      NEW MEDICATIONS STARTED DURING THIS VISIT:  Current Discharge Medication List       Note:  This document was prepared using Dragon voice recognition software and may include unintentional dictation errors.     Darel Hong, MD 05/01/17 [phone]

## 2017-05-01 NOTE — Progress Notes (Signed)
   05/01/17 0247  Clinical Encounter Type  Visited With Family  Visit Type Follow-up  Spiritual Encounters  Spiritual Needs Emotional   Chaplain returned to follow up with son [name].  Per staff, care team was working with patient and son was in waiting room.  Chaplain sat with [name], offering emotional support and utilizing active listening.  Scott spoke of mother's health and other trips to the ED.  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  Scott reviewed the events of this night.  Staff came to let [name] know that he could go be with his mother; visit ended.

## 2017-05-01 NOTE — Progress Notes (Signed)
Patient resting in bed at this time. Will continue to monitor. Report given to RN, T. Walker.

## 2017-05-01 NOTE — Progress Notes (Signed)
   05/01/17 0212  Clinical Encounter Type  Visited With Family;Patient not available;Health care provider  Visit Type Initial  Spiritual Encounters  Spiritual Needs Emotional   Chaplain responded to page from ED.  Upon arrival, staff asked chaplain to meet patient's son in the lobby and walk him back to the room.  Chaplain stood with patient's son as doctor related information related to patient condition and plan of care.  Chaplain received page; excused self and let son know that she would come back to check in.

## 2017-05-01 NOTE — Progress Notes (Signed)
Patient resting in room.  Pleasantly confused. Able to answer questions appropriately.  Family at bedside.  No bleeding noted. No acute distress noted. Will continue to monitor.

## 2017-05-01 NOTE — H&P (Signed)
Rita Vang is an 84 y.o. female.   Chief Complaint: Hematemesis HPI: Patient with past medical history of recent hospital admission for sepsis and pneumonia with elevated troponin as well as A. fib, hypertension, history of CVA and GERD presents to the emergency department after an episode of hematemesis.  The patient vomited blood that covered her chest and hair and face.  She became hypotensive and was not protecting her airway which prompted the emergency department staff to intubate her.  Transfusion of packed red blood cells was ordered prior to the emergency department staff calling the hospitalist service for admission.  Past Medical History:  Diagnosis Date  . Anxiety   . Anxiety disorder    unspecified  . Cerebral infarction ([place])   . CVA (cerebral vascular accident) ([place])   . Dementia   . Depression   . GERD (gastroesophageal reflux disease)   . History of recurrent UTIs   . HLD (hyperlipidemia)   . HTN (hypertension)   . Vertigo    syncope    Past Surgical History:  Procedure Laterality Date  . CARPAL TUNNEL RELEASE Right 1995   endoscopic   . CARPAL TUNNEL RELEASE Left 04/30/2013   endoscopic , Dr. Rudene Christians   . CATARACT EXTRACTION Right   . CHOLECYSTECTOMY    . ELBOW SURGERY Left   . ERCP W/ SPHINCTEROTOMY AND BALLOON DILATION  2008   and endoscopy  . ORIF DISTAL RADIUS FRACTURE Right 2002  . TOTAL KNEE ARTHROPLASTY Left 11/23/2007  . TRIGGER FINGER RELEASE  04/30/2013   third digit, incision tendon sheath for trigger finger    Family History  Problem Relation Age of Onset  . Heart attack Father   . Alzheimer's disease Mother    Social History:  reports that she has quit smoking. Her smoking use included cigarettes. She smoked 1.00 pack per day. She has never used smokeless tobacco. She reports that she does not drink alcohol. Her drug history is not on file.  Allergies:  Allergies  Allergen Reactions  . Clarithromycin     Other reaction(s): Abdominal  Pain  . Oxycontin [Oxycodone Hcl] Other (See Comments)    Hallucinations   . Sulfamethoxazole-Trimethoprim     Other reaction(s): Other (See Comments) Chest pain    Medications Prior to Admission  Medication Sig Dispense Refill  . alum & mag hydroxide-simeth (MAALOX/MYLANTA) 200-200-20 MG/5ML suspension Take 30 mLs by mouth every 6 (six) hours as needed for indigestion or heartburn. 355 mL 0  . Amino Acids-Protein Hydrolys (FEEDING SUPPLEMENT, PRO-STAT SUGAR FREE 64,) LIQD Take 30 mLs by mouth 2 (two) times daily between meals.     Marland Kitchen aspirin 325 MG tablet Take 1 tablet (325 mg total) by mouth daily. 30 tablet 1  . bisacodyl (DULCOLAX) 10 MG suppository Place 10 mg rectally daily as needed.    . cetirizine (ZYRTEC) 10 MG tablet Take 10 mg by mouth daily as needed for allergies.    . cholecalciferol (VITAMIN D) 1000 UNITS tablet Take 1,000 Units by mouth daily.    . citalopram (CELEXA) 20 MG tablet Take 1 tablet by mouth daily.     . cyanocobalamin 1000 MCG tablet Take 2,000 mcg by mouth daily. 2 tabs    . donepezil (ARICEPT) 10 MG tablet Take 1 tablet by mouth at bedtime.     . fluticasone (FLONASE) 50 MCG/ACT nasal spray Place 1 spray into both nostrils daily.     . Fluticasone-Salmeterol (ADVAIR) 100-50 MCG/DOSE AEPB Inhale 1 puff into the  lungs 2 (two) times daily.     . furosemide (LASIX) 20 MG tablet Take 1 tablet (20 mg total) by mouth daily. 30 tablet 0  . galantamine (RAZADYNE) 4 MG tablet Take 4 mg by mouth 2 (two) times daily with a meal.     . Glucosamine-Chondroit-Vit C-Mn (GLUCOSAMINE 1500 COMPLEX PO) Take 1 capsule by mouth daily.    Marland Kitchen glycopyrrolate (ROBINUL) 1 MG tablet Take 1 mg by mouth daily.     Marland Kitchen guaiFENesin-dextromethorphan (ROBITUSSIN DM) 100-10 MG/5ML syrup Take 5 mLs by mouth every 4 (four) hours as needed for cough. 118 mL 0  . ipratropium (ATROVENT) 0.02 % nebulizer solution Take 2.5 mLs (0.5 mg total) by nebulization 3 (three) times daily. 75 mL 12  .  levalbuterol (XOPENEX) 1.25 MG/0.5ML nebulizer solution Take 1.25 mg by nebulization every 4 (four) hours as needed for wheezing or shortness of breath. 1 each 12  . levalbuterol (XOPENEX) 1.25 MG/0.5ML nebulizer solution Take 1.25 mg by nebulization 3 (three) times daily. 1 each 12  . Magnesium Oxide 250 MG TABS Take 1 tablet by mouth daily.    . meclizine (ANTIVERT) 25 MG tablet Take 25 mg by mouth daily as needed for dizziness.    . Multiple Vitamins-Minerals (I-VITE PROTECT PO) Take 1 tablet by mouth daily.    . Nutritional Supplements (ENSURE ENLIVE PO) Take 1 Bottle by mouth 2 (two) times daily between meals.    . polyethylene glycol (MIRALAX) packet Take 17 g by mouth as needed.     . potassium chloride SA (K-DUR,KLOR-CON) 20 MEQ tablet Take 20 mEq by mouth daily.    . QUEtiapine (SEROQUEL) 25 MG tablet Take 25 mg by mouth at bedtime.    . sennosides-docusate sodium (SENOKOT-S) 8.6-50 MG tablet Take 1 tablet by mouth 2 (two) times daily.    . sodium chloride (OCEAN) 0.65 % nasal spray Place 2 sprays into the nose as needed. Into each nostril for congestion, dry sinuses, irritation. May keep at bedside.    . sodium phosphate (FLEET) 7-19 GM/118ML ENEM Place 1 enema rectally every three (3) days as needed for severe constipation. Constipation not relieved by Milk of Magnesia or Bisacodyl Suppository.    . sorbitol 70 % solution Take 30 mLs by mouth every 2 (two) hours as needed. Give 30 mL po Q 2 hours until large BM. Then may change order to prn- for constipation/ impaction    . traMADol (ULTRAM) 50 MG tablet Take by mouth 2 (two) times daily. pain. Hold for sedation    . traMADol (ULTRAM) 50 MG tablet Take 50 mg by mouth every 4 (four) hours as needed.    . valACYclovir (VALTREX) 500 MG tablet Take 500 mg by mouth daily.      Results for orders placed or performed during the hospital encounter of 05/01/17 (from the past 48 hour(s))  Comprehensive metabolic panel     Status: Abnormal    Collection Time: 05/01/17  2:25 AM  Result Value Ref Range   Sodium 142 135 - 145 mmol/L   Potassium 4.0 3.5 - 5.1 mmol/L   Chloride 110 101 - 111 mmol/L   CO2 20 (L) 22 - 32 mmol/L   Glucose, Bld 245 (H) 65 - 99 mg/dL   BUN 29 (H) 6 - 20 mg/dL   Creatinine, Ser 1.10 (H) 0.44 - 1.00 mg/dL   Calcium 8.3 (L) 8.9 - 10.3 mg/dL   Total Protein 4.7 (L) 6.5 - 8.1 g/dL   Albumin 2.6 (  L) 3.5 - 5.0 g/dL   AST 34 15 - 41 U/L   ALT 13 (L) 14 - 54 U/L   Alkaline Phosphatase 95 38 - 126 U/L   Total Bilirubin 0.5 0.3 - 1.2 mg/dL   GFR calc non Af Amer 43 (L) >60 mL/min   GFR calc Af Amer 50 (L) >60 mL/min    Comment: (NOTE) The eGFR has been calculated using the CKD EPI equation. This calculation has not been validated in all clinical situations. eGFR's persistently <60 mL/min signify possible Chronic Kidney Disease.    Anion gap 12 5 - 15    Comment: Performed at [facility], [address] [place], [place] [address]  Troponin I     Status: None   Collection Time: 05/01/17  2:25 AM  Result Value Ref Range   Troponin I <0.03 <0.03 ng/mL    Comment: Performed at [facility], [address] [place], [place] [address]  CBC with Differential     Status: Abnormal   Collection Time: 05/01/17  2:25 AM  Result Value Ref Range   WBC 24.2 (H) 3.6 - 11.0 K/uL   RBC 2.71 (L) 3.80 - 5.20 MIL/uL   Hemoglobin 8.6 (L) 12.0 - 16.0 g/dL   HCT 27.1 (L) 35.0 - 47.0 %   MCV 100.0 80.0 - 100.0 fL   MCH 31.7 26.0 - 34.0 pg   MCHC 31.7 (L) 32.0 - 36.0 g/dL   RDW 15.2 (H) 11.5 - 14.5 %   Platelets 197 150 - 440 K/uL   Neutrophils Relative % 81 %   Lymphocytes Relative 13 %   Monocytes Relative 4 %   Eosinophils Relative 1 %   Basophils Relative 1 %   Neutro Abs 19.7 (H) 1.4 - 6.5 K/uL   Lymphs Abs 3.1 1.0 - 3.6 K/uL   Monocytes Absolute 1.0 (H) 0.2 - 0.9 K/uL   Eosinophils Absolute 0.2 0 - 0.7 K/uL   Basophils Absolute 0.2 (H) 0 - 0.1 K/uL   RBC Morphology MIXED RBC POPULATION      Comment: POLYCHROMASIA PRESENT   Smear Review      PLATELET CLUMPS NOTED ON SMEAR, COUNT APPEARS ADEQUATE    Comment: Performed at [facility], [address] [place], [place] [address]  Lactic acid, plasma     Status: Abnormal   Collection Time: 05/01/17  2:25 AM  Result Value Ref Range   Lactic Acid, Venous 7.6 (HH) 0.5 - 1.9 mmol/L    Comment: CRITICAL RESULT CALLED TO, READ BACK BY AND VERIFIED WITH LAURA CATES AT 0326 ON 05/01/17 [place]. Performed at [facility], [address] [place], [place] [address]   Protime-INR     Status: Abnormal   Collection Time: 05/01/17  2:25 AM  Result Value Ref Range   Prothrombin Time 15.7 (H) 11.4 - 15.2 seconds   INR 1.26     Comment: Performed at [facility], [address] [place], [place] [address]  Type and screen [place]     Status: None (Preliminary result)   Collection Time: 05/01/17  2:25 AM  Result Value Ref Range   ABO/RH(D) PENDING    Antibody Screen PENDING    Sample Expiration      05/04/2017 Performed at [facility], [address], [place], [place] [address]   ABO/Rh     Status: None   Collection Time: 05/01/17  2:25 AM  Result Value Ref Range   ABO/RH(D)  O NEG Performed at [facility], [address] [place], [place] [address]   Type and screen Ordered by PROVIDER DEFAULT     Status: None   Collection Time: 05/01/17  2:51 AM  Result Value Ref Range   ABO/RH(D) O NEG    Antibody Screen NEG    Sample Expiration      05/04/2017 Performed at [facility], [address] [place], [place] [address]   Prepare RBC (crossmatch)     Status: None   Collection Time: 05/01/17  2:53 AM  Result Value Ref Range   Order Confirmation      ORDER PROCESSED BY BLOOD BANK Performed at [facility], [address] [place], [place] [address]   Hemoglobin and hematocrit, blood (STAT)     Status: Abnormal   Collection Time:  05/01/17  3:12 AM  Result Value Ref Range   Hemoglobin 11.1 (L) 12.0 - 16.0 g/dL    Comment: RESULT REPEATED AND VERIFIED   HCT 33.9 (L) 35.0 - 47.0 %    Comment: Performed at [facility], [address] [place], [place] [address]  Fibrin derivatives D-[facility] only)     Status: Abnormal   Collection Time: 05/01/17  3:12 AM  Result Value Ref Range   Fibrin derivatives D-dimer ([facility]) 4,320.43 (H) 0.00 - 499.00 ng/mL (FEU)    Comment: (NOTE) <> Exclusion of Venous Thromboembolism (VTE) - OUTPATIENT ONLY   (Emergency Department or [place])   0-499 ng/ml (FEU): With a low to intermediate pretest probability                      for VTE this test result excludes the diagnosis                      of VTE.   >499 ng/ml (FEU) : VTE not excluded; additional work up for VTE is                      required. <> Testing on Inpatients and Evaluation of Disseminated Intravascular   Coagulation (DIC) Reference Range:   0-499 ng/ml (FEU) Performed at [facility], [address] [place], [place] [address]   Fibrinogen     Status: None   Collection Time: 05/01/17  3:12 AM  Result Value Ref Range   Fibrinogen 254 210 - 475 mg/dL    Comment: Performed at [facility], [address] [place], [place] [address]  Platelet count     Status: Abnormal   Collection Time: 05/01/17  3:12 AM  Result Value Ref Range   Platelets 135 (L) 150 - 440 K/uL    Comment: Performed at [facility], [address] [place], [place] [address]  Protime-INR     Status: Abnormal   Collection Time: 05/01/17  3:12 AM  Result Value Ref Range   Prothrombin Time 15.9 (H) 11.4 - 15.2 seconds   INR 1.28     Comment: Performed at [facility], [address] [place], [place] [address]  APTT     Status: None   Collection Time: 05/01/17  3:12 AM  Result Value Ref Range   aPTT 27 24 - 36 seconds    Comment: Performed at [facility], [address]  [place], [place] [address]  Glucose, capillary     Status: Abnormal   Collection Time: 05/01/17  4:08 AM  Result Value Ref Range  Glucose-Capillary 137 (H) 65 - 99 mg/dL   Comment 1 Notify RN   Blood gas, arterial     Status: Abnormal   Collection Time: 05/01/17  4:22 AM  Result Value Ref Range   FIO2 0.40    Delivery systems VENTILATOR    Mode ASSIST CONTROL    VT 450 mL   LHR 16 resp/min   Peep/cpap 5.0 cm H20   pH, Arterial 7.20 (L) 7.350 - 7.450   pCO2 arterial 57 (H) 32.0 - 48.0 mmHg   pO2, Arterial 93 83.0 - 108.0 mmHg   Bicarbonate 21.8 20.0 - 28.0 mmol/L   Acid-base deficit 6.7 (H) 0.0 - 2.0 mmol/L   O2 Saturation 95.9 %   Patient temperature 35.9    Collection site RIGHT RADIAL    Sample type ARTERIAL DRAW    Allens test (pass/fail) PASS PASS    Comment: Performed at [facility], [address], [place],  [address]   Dg Abdomen 1 View  Result Date: 05/01/2017 CLINICAL DATA:  Orogastric tube placement. EXAM: ABDOMEN - 1 VIEW COMPARISON:  CT of the abdomen and pelvis from 11/19/2012 FINDINGS: The patient's enteric tube is noted extending overlying the body of the stomach. The visualized bowel gas pattern is grossly unremarkable. Clips are noted at the right upper quadrant, reflecting prior cholecystectomy. Mild bibasilar atelectasis is noted. Degenerative change is noted along the lower thoracic and upper lumbar spine. IMPRESSION: Enteric tube noted extending overlying the body of the stomach. Electronically Signed   By: Garald Balding M.D.   On: 05/01/2017 03:20   Dg Chest Portable 1 View  Result Date: 05/01/2017 CLINICAL DATA:  Endotracheal tube placement. EXAM: PORTABLE CHEST 1 VIEW COMPARISON:  Chest radiograph performed earlier today at 2:13 a.m. FINDINGS: The patient's endotracheal tube is seen ending 3 cm above the carina. An enteric tube is noted extending below the diaphragm. Mild vascular congestion is noted. Mild bibasilar opacities likely reflect  atelectasis. The previously suggested pleural effusion is not well seen. No pneumothorax is identified. The cardiomediastinal silhouette is borderline normal in size. No acute osseous abnormalities are identified. There is chronic deformity of the proximal right humerus. IMPRESSION: 1. Endotracheal tube seen ending 3 cm above the carina. 2. Mild vascular congestion noted; mild bibasilar airspace opacities likely reflect atelectasis. Electronically Signed   By: Garald Balding M.D.   On: 05/01/2017 03:19   Dg Chest Port 1 View  Result Date: 05/01/2017 CLINICAL DATA:  Acute onset of hematemesis. Decreased responsiveness. Shortness of breath. EXAM: PORTABLE CHEST 1 VIEW COMPARISON:  Chest radiograph performed 03/09/2017 FINDINGS: The lungs are well-aerated. A small right pleural effusion is noted. There is no evidence of pneumothorax. The cardiomediastinal silhouette is borderline enlarged. No acute osseous abnormalities are seen. Clips are noted within the right upper quadrant, reflecting prior cholecystectomy. IMPRESSION: Small right pleural effusion noted; lungs otherwise clear. Borderline cardiomegaly. Electronically Signed   By: Garald Balding M.D.   On: 05/01/2017 02:47    Review of Systems  Unable to perform ROS: Intubated    Blood pressure 115/80, pulse (!) 55, temperature (!) 94.6 F (34.8 C), resp. rate 16, height 5' 2"  (1.575 m), weight 59 kg (130 lb), SpO2 99 %. Physical Exam  Vitals reviewed. Constitutional: She is oriented to person, place, and time. She appears well-developed and well-nourished. No distress. She is intubated.  HENT:  Head: Normocephalic and atraumatic.  Mouth/Throat: Oropharynx is clear and moist.  Eyes: Pupils are equal, round, and reactive to  light. EOM are normal. No scleral icterus.  Neck: Normal range of motion. Neck supple. No JVD present. No tracheal deviation present. No thyromegaly present.  Cardiovascular: Normal rate, regular rhythm and normal heart sounds.  Exam reveals no gallop and no friction rub.  No murmur heard. Respiratory: Effort normal and breath sounds normal. She is intubated.  GI: Soft. Bowel sounds are normal. She exhibits no distension. There is no tenderness.  Genitourinary:  Genitourinary Comments: Deferred  Musculoskeletal: Normal range of motion. She exhibits no edema.  Lymphadenopathy:    She has no cervical adenopathy.  Neurological: She is alert and oriented to person, place, and time. No cranial nerve deficit. She exhibits normal muscle tone.  Alert prior to intubation and sedation  Skin: Skin is warm and dry. No rash noted. No erythema.  Psychiatric:  Intubated and sedated     Assessment/Plan This is a 84 year old female admitted for hematemesis. 1.  Hematemesis: Secondary to upper GI bleed.  The patient has been taking aspirin 325 mg.  We will hold antiplatelet therapy.  She is intubated and will remain n.p.o.  Gastric tube in place suctioning coffee-ground emesis and frank blood to gravity.  Gastroenterology is been called and will scope the patient as soon as possible.  Continue pantoprazole drip as well as tranexamic acid. 2.  Hypertension: initially low/normal; improving with blood transfusion. Normal saline to maintain volume.  I have held antihypertensive medication at this time but if blood pressure exceeds acceptable range initiate IV therapy to mitigate likelihood of recurrent bleed. 3.  GERD: Continue PPI.  Likely etiology of GI bleed. 4.  CKD: Stage III; hydrate with intravenous fluid.  Avoid nephrotoxic agents.  Continue to follow renal function. 5.  DVT prophylaxis: SCDs 6.  GI prophylaxis: As above The patient is a full code.  Time spent on admission orders and critical care approximately 45 minutes.  Discussed with nurse practitioner on ICU service.  Harrie Foreman, MD 05/01/2017, 4:46 AM

## 2017-05-02 ENCOUNTER — Encounter: Payer: Self-pay | Admitting: Gastroenterology

## 2017-05-02 DIAGNOSIS — K922 Gastrointestinal hemorrhage, unspecified: Secondary | ICD-10-CM

## 2017-05-02 DIAGNOSIS — R578 Other shock: Secondary | ICD-10-CM

## 2017-05-02 LAB — BASIC METABOLIC PANEL
Anion gap: 7 (ref 5–15)
BUN: 49 mg/dL — AB (ref 6–20)
CHLORIDE: 114 mmol/L — AB (ref 101–111)
CO2: 24 mmol/L (ref 22–32)
Calcium: 8.1 mg/dL — ABNORMAL LOW (ref 8.9–10.3)
Creatinine, Ser: 1 mg/dL (ref 0.44–1.00)
GFR calc Af Amer: 56 mL/min — ABNORMAL LOW (ref >60)
GFR calc non Af Amer: 48 mL/min — ABNORMAL LOW (ref >60)
Glucose, Bld: 77 mg/dL (ref 65–99)
POTASSIUM: 3.7 mmol/L (ref 3.5–5.1)
Sodium: 145 mmol/L (ref 135–145)

## 2017-05-02 LAB — BPAM RBC
BLOOD PRODUCT EXPIRATION DATE: 201904232359
BLOOD PRODUCT EXPIRATION DATE: 201904232359
BLOOD PRODUCT EXPIRATION DATE: 201905082359
Blood Product Expiration Date: 201905082359
ISSUE DATE / TIME: 201904180220
ISSUE DATE / TIME: 201904180220
UNIT TYPE AND RH: 9500
UNIT TYPE AND RH: 9500
Unit Type and Rh: 5100
Unit Type and Rh: 5100

## 2017-05-02 LAB — TYPE AND SCREEN
ABO/RH(D): O NEG
ANTIBODY SCREEN: NEGATIVE
UNIT DIVISION: 0
Unit division: 0
Unit division: 0
Unit division: 0

## 2017-05-02 LAB — BPAM PLATELET PHERESIS
BLOOD PRODUCT EXPIRATION DATE: 201904192359
Unit Type and Rh: 9500

## 2017-05-02 LAB — IRON AND TIBC
IRON: 38 ug/dL (ref 28–170)
Saturation Ratios: 19 % (ref 10.4–31.8)
TIBC: 203 ug/dL — ABNORMAL LOW (ref 250–450)
UIBC: 165 ug/dL

## 2017-05-02 LAB — CBC
HCT: 31.9 % — ABNORMAL LOW (ref 35.0–47.0)
HEMOGLOBIN: 10.6 g/dL — AB (ref 12.0–16.0)
MCH: 31.2 pg (ref 26.0–34.0)
MCHC: 33.3 g/dL (ref 32.0–36.0)
MCV: 93.5 fL (ref 80.0–100.0)
Platelets: 128 10*3/uL — ABNORMAL LOW (ref 150–440)
RBC: 3.42 MIL/uL — AB (ref 3.80–5.20)
RDW: 16.3 % — ABNORMAL HIGH (ref 11.5–14.5)
WBC: 21.8 10*3/uL — ABNORMAL HIGH (ref 3.6–11.0)

## 2017-05-02 LAB — PROTIME-INR
INR: 1.16
PROTHROMBIN TIME: 14.7 s (ref 11.4–15.2)

## 2017-05-02 LAB — FERRITIN: Ferritin: 70 ng/mL (ref 11–307)

## 2017-05-02 LAB — PREPARE PLATELET PHERESIS: Unit division: 0

## 2017-05-02 LAB — HEMOGLOBIN AND HEMATOCRIT, BLOOD
HCT: 30.5 % — ABNORMAL LOW (ref 35.0–47.0)
HEMATOCRIT: 31.9 % — AB (ref 35.0–47.0)
HEMOGLOBIN: 10.3 g/dL — AB (ref 12.0–16.0)
Hemoglobin: 10.5 g/dL — ABNORMAL LOW (ref 12.0–16.0)

## 2017-05-02 LAB — MAGNESIUM: Magnesium: 1.6 mg/dL — ABNORMAL LOW (ref 1.7–2.4)

## 2017-05-02 LAB — FOLATE: FOLATE: 7.8 ng/mL (ref >5.9)

## 2017-05-02 LAB — PHOSPHORUS: Phosphorus: 3.1 mg/dL (ref 2.5–4.6)

## 2017-05-02 LAB — APTT: aPTT: 31 seconds (ref 24–36)

## 2017-05-02 LAB — LACTIC ACID, PLASMA: LACTIC ACID, VENOUS: 1.1 mmol/L (ref 0.5–1.9)

## 2017-05-02 LAB — PREPARE RBC (CROSSMATCH)

## 2017-05-02 MED ORDER — ENSURE ENLIVE PO LIQD
237.0000 mL | Freq: Two times a day (BID) | ORAL | Status: DC
  Administered 2017-05-03 – 2017-05-07 (×9): 237 mL via ORAL

## 2017-05-02 MED ORDER — METOPROLOL TARTRATE 25 MG PO TABS
25.0000 mg | ORAL_TABLET | Freq: Two times a day (BID) | ORAL | Status: DC
  Administered 2017-05-02 – 2017-05-07 (×11): 25 mg via ORAL
  Filled 2017-05-02 (×12): qty 1

## 2017-05-02 MED ORDER — MAGNESIUM SULFATE 2 GM/50ML IV SOLN
2.0000 g | Freq: Once | INTRAVENOUS | Status: AC
  Administered 2017-05-02: 2 g via INTRAVENOUS
  Filled 2017-05-02: qty 50

## 2017-05-02 MED ORDER — MUPIROCIN 2 % EX OINT
TOPICAL_OINTMENT | Freq: Two times a day (BID) | CUTANEOUS | Status: DC
  Administered 2017-05-02: 23:00:00 via NASAL
  Administered 2017-05-03: 1 via NASAL
  Administered 2017-05-03 – 2017-05-07 (×8): via NASAL
  Filled 2017-05-02 (×3): qty 22

## 2017-05-02 MED ORDER — SALINE SPRAY 0.65 % NA SOLN
1.0000 | NASAL | Status: DC | PRN
  Filled 2017-05-02: qty 44

## 2017-05-02 MED ORDER — PHENYLEPHRINE HCL 0.25 % NA SOLN
1.0000 | NASAL | Status: DC | PRN
Start: 2017-05-02 — End: 2017-05-04
  Filled 2017-05-02: qty 15

## 2017-05-02 MED ORDER — AYR SALINE NASAL NA GEL: 1.0000 "application " | Freq: Four times a day (QID) | NASAL | Status: DC

## 2017-05-02 MED ORDER — EPINEPHRINE HCL (NASAL) 0.1 % NA SOLN
1.0000 [drp] | NASAL | Status: DC | PRN
  Filled 2017-05-02: qty 30

## 2017-05-02 NOTE — Consult Note (Signed)
.. Rita Vang, [place]line [id number] 01/05/1925 Rita Vang, Srikar, MD  Reason for Consult: epistaxis  HPI: 84 y.o. Female admitted for GI bleed.  Underwent EGD and bleeding areas in cardia of stomach were clipped per EGD report..  Episode of epistaxis this a.m. And 7.5cm rhinorocket placed in right side.  Still had bleeding from left side and mouth and called to see patient.  Patient stopped bleeding about 30 minutes ago per nurse.  Patient with dementia and full code per nurse.  Extubated about 12 hours ago.  Allergies:  Allergies  Allergen Reactions  . Clarithromycin     Other reaction(s): Abdominal Pain  . Oxycontin [Oxycodone Hcl] Other (See Comments)    Hallucinations   . Sulfamethoxazole-Trimethoprim     Other reaction(s): Other (See Comments) Chest pain    ROS: Review of systems normal other than 12 systems except per HPI.  PMH:  Past Medical History:  Diagnosis Date  . Anxiety   . Anxiety disorder    unspecified  . Cerebral infarction ([facility])   . CVA (cerebral vascular accident) ([facility])   . Dementia   . Depression   . GERD (gastroesophageal reflux disease)   . History of recurrent UTIs   . HLD (hyperlipidemia)   . HTN (hypertension)   . Vertigo    syncope    FH:  Family History  Problem Relation Age of Onset  . Heart attack Father   . Alzheimer's disease Mother     SH:  Social History   Socioeconomic History  . Marital status: Widowed    Spouse name: Not on file  . Number of children: 4  . Years of education: 3614  . Highest education level: Associate degree: occupational, Scientist, product/process developmenttechnical, or vocational program  Occupational History  . Not on file  Social Needs  . Financial resource strain: Not on file  . Food insecurity:    Worry: Not on file    Inability: Not on file  . Transportation needs:    Medical: Not on file    Non-medical: Not on file  Tobacco Use  . Smoking status: Former Smoker    Packs/day: 1.00    Types: Cigarettes  . Smokeless tobacco: Never  Used  . Tobacco comment: quit at age 84  Substance and Sexual Activity  . Alcohol use: No  . Drug use: Not on file  . Sexual activity: Not on file  Lifestyle  . Physical activity:    Days per week: Not on file    Minutes per session: Not on file  . Stress: Not on file  Relationships  . Social connections:    Talks on phone: Not on file    Gets together: Not on file    Attends religious service: Not on file    Active member of club or organization: Not on file    Attends meetings of clubs or organizations: Not on file    Relationship status: Not on file  . Intimate partner violence:    Fear of current or ex partner: Not on file    Emotionally abused: Not on file    Physically abused: Not on file    Forced sexual activity: Not on file  Other Topics Concern  . Not on file  Social History Narrative  . Not on file    PSH:  Past Surgical History:  Procedure Laterality Date  . CARPAL TUNNEL RELEASE Right 1995   endoscopic   . CARPAL TUNNEL RELEASE Left 04/30/2013   endoscopic , Dr. Rosita KeaMenz   .  CATARACT EXTRACTION Right   . CHOLECYSTECTOMY    . ELBOW SURGERY Left   . ERCP W/ SPHINCTEROTOMY AND BALLOON DILATION  2008   and endoscopy  . ORIF DISTAL RADIUS FRACTURE Right 2002  . TOTAL KNEE ARTHROPLASTY Left 11/23/2007  . TRIGGER FINGER RELEASE  04/30/2013   third digit, incision tendon sheath for trigger finger    Physical  Exam:  GEN-  Somnolent, yet somewhat arousable.  Responds to name and opened eyes once NOSE-  Rhino-rocket in right nares 90% inserted.  No active bleeding.  Left nares with dried blood but no active bleeding.  OC/OP- xerostomia, good visualization of posterior oropharynx with no active bleeding.  Dried blood on teeth and lips. EARS-  Clear external ear canals NECK-  Supple, no masses or lesions RESP-  Respiratory cleared CARD-  RRR  A/P: Epistaxis s/p packing in ICU with no active bleeding.  Approx 20ml of blood loss in canister.  Unsure if GI bleeding  with vomitting/spitting up blood through mouth/nares or primary nasal bleeding that has been solved with packing on right side.  Will leave packing in place for 5 days and then remove as outpatient.    Plan:    1)  Nasal saline spray TID   2)  Afrin spray prn nasal bleeding  3)  Humidification  4)  Bactroban ointment to left nares TID with qtip x 5 days for significant dryness.  5)  Patient will need Gram + antibiotic coverage for duration of indwelling nasal packing.      Rita Vang 05/02/2017 4:53 AM

## 2017-05-02 NOTE — Evaluation (Signed)
Clinical/Bedside Swallow Evaluation Patient Details  Name: Rita Vang MRN: [id number] Date of Birth: 03/10/1924  Today's Date: 05/02/2017 Time: SLP Start Time (ACUTE ONLY): 1000 SLP Stop Time (ACUTE ONLY): 1100 SLP Time Calculation (min) (ACUTE ONLY): 60 min  Past Medical History:  Past Medical History:  Diagnosis Date  . Anxiety   . Anxiety disorder    unspecified  . Cerebral infarction (HCC)   . CVA (cerebral vascular accident) (HCC)   . Dementia   . Depression   . GERD (gastroesophageal reflux disease)   . History of recurrent UTIs   . HLD (hyperlipidemia)   . HTN (hypertension)   . Vertigo    syncope   Past Surgical History:  Past Surgical History:  Procedure Laterality Date  . CARPAL TUNNEL RELEASE Right 1995   endoscopic   . CARPAL TUNNEL RELEASE Left 04/30/2013   endoscopic , Dr. Rosita KeaMenz   . CATARACT EXTRACTION Right   . CHOLECYSTECTOMY    . ELBOW SURGERY Left   . ERCP W/ SPHINCTEROTOMY AND BALLOON DILATION  2008   and endoscopy  . ESOPHAGOGASTRODUODENOSCOPY (EGD) WITH PROPOFOL N/A 05/01/2017   Procedure: ESOPHAGOGASTRODUODENOSCOPY (EGD) WITH PROPOFOL;  Surgeon: Pasty Spillersahiliani, Varnita B, MD;  Location: [facility] ENDOSCOPY;  Service: Endoscopy;  Laterality: N/A;  . ORIF DISTAL RADIUS FRACTURE Right 2002  . TOTAL KNEE ARTHROPLASTY Left 11/23/2007  . TRIGGER FINGER RELEASE  04/30/2013   third digit, incision tendon sheath for trigger finger   HPI:  Pt is a 84 yo female with a PMH of Dementia, Vertigo, HTN, GERD, CKD Stage III, Protein Calorie Malnutrition, Hyperlipidemia, Recurrent UTI's, Depression, Dementia, CVA, and Anxiety. Per EMS, they were called out to pt's home for patient vomiting blood. Pt recently d/c'd from Rehab d/t deconditioning. Pt admitted with massive upper GI bleed and acute hypoxic respiratory failure presumed secondary to aspiration requiring mechanical intubation. Pt was extubated by the evening. However, d/t an episode of epistaxis this a.m., a 7.5cm  rhinorocket placed in right nares. Pt awakened appropriately and seemed pleased to have po's w/ SLP. Talkative w/ need for min cues for questions, follow through w/ tasks.    Assessment / Plan / Recommendation Clinical Impression  Pt appears to present w/ adequate oropharyngeal phase swallow function w/ reduced risk for aspiration when following general aspiration precautions. Pt does have a baseline of Dementia and GERD which can both impact swallowing function/motility; any Reflux or Regurgitation can increase risk for Reflux aspiration. Pt also had a recent GI bleed; a nasal rhinorocket is placed in the R nares d/t an episode of epistaxis. Pt consumed po trials of thin liquids(mostly via straw d/t rhinorocket) and purees w/ no overt s/s of aspiration noted; clear vocal quality noted post trials w/ no decline in respiratory status occurring. O2 sats remained in mid-upper 90's during trials. Oral phase appeared [facility] for bolus management and clearing w/ all trials. Pt fed self w/ min setup assist. No OM weakness noted during movements, speech. Recommend continue initiation of a Pureed diet w/ thin liquids for easier oral intake at this time of her illness; conservation of energy and ease w/ oral phase. Recommend general aspiration precautions; Pills in Puree Crushed for easier swallowing. ST services will f/u w/ pt's status and trials to upgrade diet consistency as appropriate for pt. Recommend f/u w/ GI for GERD questions/tx and how it impacts overall oral intake; f/u w/ Neurology and how Dementia can impact overall oral intake/desire; f/u w/ Dietician for support.   SLP Visit Diagnosis:  Dysphagia, unspecified (R13.10)    Aspiration Risk  (reduced following precautions)    Diet Recommendation  Dysphagia level 1 (puree) w/ Thin liquids; general aspiration precautions; Reflux precautions. Tray setup support, encouragement. Dietician f/u w/ nutritional supplements as needed   Medication Administration:  Crushed with puree(for easier swallowing at this time)    Other  Recommendations Recommended Consults: (Dietician f/u) Oral Care Recommendations: Oral care BID;Staff/trained caregiver to provide oral care Other Recommendations: (n/a)   Follow up Recommendations None      Frequency and Duration min 2x/week  1 week       Prognosis Prognosis for Safe Diet Advancement: Good Barriers to Reach Goals: Cognitive deficits      Swallow Study   General Date of Onset: 05/01/17 HPI: Pt is a 84 yo female with a PMH of Dementia, Vertigo, HTN, GERD, CKD Stage III, Protein Calorie Malnutrition, Hyperlipidemia, Recurrent UTI's, Depression, Dementia, CVA, and Anxiety. Per EMS, they were called out to pt's home for patient vomiting blood. Pt recently d/c'd from Rehab d/t deconditioning. Pt admitted with massive upper GI bleed and acute hypoxic respiratory failure presumed secondary to aspiration requiring mechanical intubation. Pt was extubated by the evening. However, d/t an episode of epistaxis this a.m., a 7.5cm rhinorocket placed in right nares. Pt awakened appropriately and seemed pleased to have po's w/ SLP. Talkative w/ need for min cues for questions, follow through w/ tasks.  Type of Study: Bedside Swallow Evaluation Previous Swallow Assessment: none reported Diet Prior to this Study: NPO(since admission) Temperature Spikes Noted: (wbc trending down 21.8; temp elevated at admit) Respiratory Status: Room air History of Recent Intubation: Yes Length of Intubations (days): 1 days Date extubated: 05/01/17 Behavior/Cognition: Alert;Cooperative;Pleasant mood;Confused;Distractible;Requires cueing(baseline Dementia) Oral Cavity Assessment: Dried secretions(blood) Oral Care Completed by SLP: Yes Oral Cavity - Dentition: Adequate natural dentition;Missing dentition(just few) Vision: Functional for self-feeding Self-Feeding Abilities: Able to feed self;Needs assist;Needs set up Patient Positioning:  Upright in bed Baseline Vocal Quality: Normal;Low vocal intensity Volitional Cough: Strong Volitional Swallow: Able to elicit    Oral/Motor/Sensory Function Overall Oral Motor/Sensory Function: Within functional limits   Ice Chips Ice chips: Within functional limits Presentation: Spoon(fed; 5 trials)   Thin Liquid Thin Liquid: Within functional limits Presentation: Cup;Self Fed;Straw(5 trials each)    Nectar Thick Nectar Thick Liquid: Not tested   Honey Thick Honey Thick Liquid: Not tested   Puree Puree: Within functional limits Presentation: Self Fed;Spoon(assisted at times; ~4 ozs total) Other Comments: cleared some bloddy phlegm   Solid   GO   Solid: Not tested Other Comments: will continue to assess at tx session; recent GI bleeding        Jerilynn Som, MS, CCC-SLP Haasini Patnaude 05/02/2017,2:27 PM

## 2017-05-02 NOTE — Progress Notes (Signed)
Patient began to have nose bleed while being changed. NP Luci Bankukov called to come to bedside. Verbal orders given for Epinephrine nasal spray, nasal packing and/or nasal tampon insertion and suctioning. All of the verbal orders completed. NP Tukov administered the Epinephrine nasal solution into each nostril.  Patient remains with nasal tampon in right nare at this time. Will continue to monitor. Suction setup at bedside. Will continue to monitor patient.

## 2017-05-02 NOTE — Progress Notes (Signed)
   05/02/17 1349  Clinical Encounter Type  Visited With Patient  Visit Type Follow-up  Spiritual Encounters  Spiritual Needs Emotional   Chaplain spoke with patient about on going chaplain availability.  Patient appreciative of visit.  She indicated that she would prefer to visit longer another time.

## 2017-05-02 NOTE — Consult Note (Signed)
Name: VERNADETTE STUTSMAN MRN: [id number] DOB: 01/02/1925    ADMISSION DATE:  05/01/2017 CONSULTATION DATE: 05/01/2017  REFERRING MD : Dr. Sheryle Hail   CHIEF COMPLAINT: Hematemesis   BRIEF PATIENT DESCRIPTION:  84 yo female admitted with massive upper GI bleed and acute hypoxic respiratory failure presumed secondary to aspiration requiring mechanical intubation   SIGNIFICANT EVENTS  04/18-Pt admitted to ICU with upper GI bleed mechanically intubated, extubated after EGD 4/19 epistaxis Rhino Rocket placed  STUDIES:  None   HISTORY OF PRESENT ILLNESS:   This is a 84 yo female with a PMH Vertigo, HTN, CKD Stage III, Protein Calorie Malnutrition, Hyperlipidemia, Recurrent UTI's, GERD, Depression, Dementia, CVA, Cerebral Infarction, and Anxiety. She presented to [facility] ER on 04/18 via EMS from home with hematemesis with large clots and decreased responsiveness.  She was discharged from the [facility] after receiving rehab due to generalized weakness and deconditioning following hospitalization secondary to pneumonia.  Upon EMS arrival at pts home her O2 sats were in the 60's on RA and pt somnolent, therefore she was placed on NRB.  In the ER pt required mechanical intubation due to hypoxia suspected secondary to aspiration. Massive transfusion protocol initiated, pt received 2 units of platelet pheresis, tranexamic acid, protonix, and on call gastroenterologist contacted by ER physician. Lab results revealed BUN 29, creatinine 1.10, lactic acid 7.6, wbc 24.2, hgb 8.6, and platelets 197.  She was subsequently admitted to ICU by hospitalist team for further workup and treatment PCCM consulted for vent management.    SUBJECTIVE EXTUBATED AFTER  EGD YESTERDAY No real source of bleeding Acute severe epistaxis last night Rhino rocket placed ENT consulted No acute distress Afebrile   ROS Alert and awake NAD No fevers No SOB All other ROS negative  VITAL SIGNS: Temp:  [98.6 F (37  C)-100.9 F (38.3 C)] 98.6 F (37 C) (04/19 0600) Pulse Rate:  [74-135] 81 (04/19 0600) Resp:  [16-33] 27 (04/19 0600) BP: (95-151)/(50-90) 145/67 (04/19 0600) SpO2:  [90 %-100 %] 94 % (04/19 0600) FiO2 (%):  [30 %] 30 % (04/18 0700) Weight:  [127 lb 6.8 oz (57.8 kg)] 127 lb 6.8 oz (57.8 kg) (04/19 0427)  PHYSICAL EXAMINATION: General: nasal rocket in place Neuro: follows commands, PERRL  HEENT: supple, no JVD  Cardiovascular: sinus tach, no M/R/G Lungs: rhonchi throughout, even, non labored  Abdomen: +BS x4, Musculoskeletal: normal bulk and tone, no edema  Skin: intact no rashes or lesions   Recent Labs  Lab 05/01/17 0225 05/01/17 1017 05/01/17 2225  NA 142 142 145  K 4.0 4.2 3.7  CL 110 114* 114*  CO2 20* 22 24  BUN 29* 44* 49*  CREATININE 1.10* 0.93 1.00  GLUCOSE 245* 126* 77   Recent Labs  Lab 05/01/17 0225  05/01/17 0613 05/01/17 1020  05/01/17 2224 05/02/17 0148 05/02/17 0427  HGB 8.6*   < > 11.7* 11.5*   < > 11.3* 10.5* 10.6*  HCT 27.1*   < > 34.8* 34.1*   < > 34.0* 31.9* 31.9*  WBC 24.2*  --   --  22.9*  --   --   --  21.8*  PLT 197   < > 124* 110*  --   --   --  128*   < > = values in this interval not displayed.    Anti-infectives (From admission, onward)   Start     Dose/Rate Route Frequency Ordered Stop   05/01/17 1200  Ampicillin-Sulbactam (UNASYN) 3 g in  sodium chloride 0.9 % 100 mL IVPB     3 g 200 mL/hr over 30 Minutes Intravenous Every 6 hours 05/01/17 1109     05/01/17 0600  Ampicillin-Sulbactam (UNASYN) 3 g in sodium chloride 0.9 % 100 mL IVPB  Status:  Discontinued     3 g 200 mL/hr over 30 Minutes Intravenous Every 12 hours 05/01/17 0525 05/01/17 1109       ASSESSMENT / PLAN: Upper GI Bleed -no obvious source of bleeding-s/p hemostat clips for possible old lesion Extubated to RA developed acute epistaxis Acute blood loss anemia  Hx: Dementia, CVA, Cerebral Infarction, CVA, GERD, and HTN  P: Continuous telemetry monitoring  Trend  BMP  Replace electrolytes as indicated  Monitor UOP  Monitor for s/sx of bleeding  Transfuse for hgb <7 and/or active s/sx of bleeding  IV protonix  SCD's for VTE prophylaxis, avoid chemical prophylaxis  GI consulted appreciate input   Epipstaxis Rhino Rocket by as per ENT Needs it for 5 days    Lucie LeatherKurian David Hokulani Rogel, M.D.  [facility]  Medical Director [facility] Medical Director [facility] Cardio-Pulmonary Department

## 2017-05-02 NOTE — Progress Notes (Signed)
Rita Darby, MD [address]  [place]  [place], [place] [address]  Main: [phone]  Fax: [phone] Pager: [phone]   Subjective: [name] extubated and hemodynamically stable. Patient had 3 episodes of bowel movements which were dark. She is tolerating diet well. And, resting comfortably   Objective: Vital signs in last 24 hours: Vitals:   05/02/17 1800 05/02/17 1900 05/02/17 2000 05/02/17 2100  BP: (!) 154/70 (!) 157/65 (!) 144/65 136/82  Pulse: 71 74 70 77  Resp: (!) 23 (!) 26 (!) 21 18  Temp:   97.7 F (36.5 C)   TempSrc:   Oral   SpO2: 95% 94% 95% 94%  Weight:      Height:       Weight change: -2 lb 9.2 oz (-1.168 kg)  Intake/Output Summary (Last 24 hours) at 05/02/2017 2135 Last data filed at 05/02/2017 2100 Gross per 24 hour  Intake 2850 ml  Output 760 ml  Net 2090 ml     Exam: Heart:: Regular rate and rhythm or S1S2 present Lungs: clear to auscultation Abdomen: soft, nontender, normal bowel sounds   Lab Results: CBC Latest Ref Rng & Units 05/02/2017 05/02/2017 05/02/2017  WBC 3.6 - 11.0 K/uL - 21.8(H) -  Hemoglobin 12.0 - 16.0 g/dL 10.3(L) 10.6(L) 10.5(L)  Hematocrit 35.0 - 47.0 % 30.5(L) 31.9(L) 31.9(L)  Platelets 150 - 440 K/uL - 128(L) -   BMP Latest Ref Rng & Units 05/01/2017 05/01/2017 05/01/2017  Glucose 65 - 99 mg/dL 77 126(H) 245(H)  BUN 6 - 20 mg/dL 49(H) 44(H) 29(H)  Creatinine 0.44 - 1.00 mg/dL 1.00 0.93 1.10(H)  Sodium 135 - 145 mmol/L 145 142 142  Potassium 3.5 - 5.1 mmol/L 3.7 4.2 4.0  Chloride 101 - 111 mmol/L 114(H) 114(H) 110  CO2 22 - 32 mmol/L 24 22 20(L)  Calcium 8.9 - 10.3 mg/dL 8.1(L) 8.0(L) 8.3(L)   Hepatic Function Latest Ref Rng & Units 05/01/2017 04/16/2017 04/02/2017  Total Protein 6.5 - 8.1 g/dL 4.7(L) 5.9(L) 5.4(L)  Albumin 3.5 - 5.0 g/dL 2.6(L) 3.0(L) 2.5(L)  AST 15 - 41 U/L 34 22 25  ALT 14 - 54 U/L 13(L) 15 16  Alk Phosphatase 38 - 126 U/L 95 94 77  Total Bilirubin 0.3 - 1.2 mg/dL 0.5 0.6 0.5    Micro Results: Recent Results (from the past 240 hour(s))  MRSA PCR Screening     Status: None   Collection Time: 05/01/17  4:22 AM  Result Value Ref Range Status   MRSA by PCR NEGATIVE NEGATIVE Final    Comment:        The GeneXpert MRSA Assay (FDA approved for NASAL specimens only), is one component of a comprehensive MRSA colonization surveillance program. It is not intended to diagnose MRSA infection nor to guide or monitor treatment for MRSA infections. Performed at [facility], [address], [place], [place] [address]    Studies/Results: Dg Abdomen 1 View  Result Date: 05/01/2017 CLINICAL DATA:  Orogastric tube placement. EXAM: ABDOMEN - 1 VIEW COMPARISON:  CT of the abdomen and pelvis from 11/19/2012 FINDINGS: The patient's enteric tube is noted extending overlying the body of the stomach. The visualized bowel gas pattern is grossly unremarkable. Clips are noted at the right upper quadrant, reflecting prior cholecystectomy. Mild bibasilar atelectasis is noted. Degenerative change is noted along the lower thoracic and upper lumbar spine. IMPRESSION: Enteric tube noted extending overlying the body of the stomach. Electronically Signed   By: Garald Balding  M.D.   On: 05/01/2017 03:20   Dg Chest Portable 1 View  Result Date: 05/01/2017 CLINICAL DATA:  Endotracheal tube placement. EXAM: PORTABLE CHEST 1 VIEW COMPARISON:  Chest radiograph performed earlier today at 2:13 a.m. FINDINGS: The patient's endotracheal tube is seen ending 3 cm above the carina. An enteric tube is noted extending below the diaphragm. Mild vascular congestion is noted. Mild bibasilar opacities likely reflect atelectasis. The previously suggested pleural effusion is not well seen. No pneumothorax is identified. The cardiomediastinal silhouette is borderline normal in size. No acute osseous abnormalities are identified. There is chronic deformity of the proximal right humerus. IMPRESSION: 1.  Endotracheal tube seen ending 3 cm above the carina. 2. Mild vascular congestion noted; mild bibasilar airspace opacities likely reflect atelectasis. Electronically Signed   By: Garald Balding M.D.   On: 05/01/2017 03:19   Dg Chest Port 1 View  Result Date: 05/01/2017 CLINICAL DATA:  Acute onset of hematemesis. Decreased responsiveness. Shortness of breath. EXAM: PORTABLE CHEST 1 VIEW COMPARISON:  Chest radiograph performed 03/09/2017 FINDINGS: The lungs are well-aerated. A small right pleural effusion is noted. There is no evidence of pneumothorax. The cardiomediastinal silhouette is borderline enlarged. No acute osseous abnormalities are seen. Clips are noted within the right upper quadrant, reflecting prior cholecystectomy. IMPRESSION: Small right pleural effusion noted; lungs otherwise clear. Borderline cardiomegaly. Electronically Signed   By: Garald Balding M.D.   On: 05/01/2017 02:47   Medications: I have reviewed the patient's current medications. Scheduled Meds: . chlorhexidine  15 mL Mouth Rinse BID  . docusate sodium  100 mg Oral BID  . mouth rinse  15 mL Mouth Rinse q12n4p  . metoprolol tartrate  25 mg Oral BID  . mupirocin ointment   Nasal BID  . pantoprazole (PROTONIX) IV  40 mg Intravenous Q12H   Continuous Infusions: . sodium chloride    . sodium chloride 100 mL/hr at 05/02/17 2112  . ampicillin-sulbactam (UNASYN) IV Stopped (05/02/17 1900)   PRN Meds:.acetaminophen **OR** acetaminophen, ondansetron **OR** ondansetron (ZOFRAN) IV, phenylephrine, sodium chloride   Assessment: Active Problems:   GI bleed   Acute gastrointestinal hemorrhage   Gastric irritation hemoglobin has been stable since EGD Plan: Check ferritin, iron, B12, folate and replace as needed Avoid aspirin 325 mg and other NSAIDs including BC powder or Goody powder Protonix 40 mg twice daily for 3 months Currently receiving antibiotics for aspiration pneumonia Anticipate discharge home in 1-2  days Follow up with GI in 2-4 weeks after discharge    LOS: 1 day   Rita Vang 05/02/2017, 9:35 PM

## 2017-05-02 NOTE — Progress Notes (Signed)
Pharmacy Antibiotic Note  Rita Vang is a 84 y.o. female admitted on 05/01/2017 with aspiration pneumonia.  Patient now extubated. Pharmacy has been consulted for Unasyn dosing.  Plan: Transition patient to Unasyn 3g IV Q6hr.    Height: 5\' 2"  (157.5 cm) Weight: 127 lb 6.8 oz (57.8 kg) IBW/kg (Calculated) : 50.1  Temp (24hrs), Avg:99.5 F (37.5 C), Min:98.6 F (37 C), Max:100.8 F (38.2 C)  Recent Labs  Lab 05/01/17 0225 05/01/17 0613 05/01/17 1017 05/01/17 1020 05/01/17 2225 05/02/17 0427  WBC 24.2*  --   --  22.9*  --  21.8*  CREATININE 1.10*  --  0.93  --  1.00  --   LATICACIDVEN 7.6* 2.1*  --   --   --  1.1    Estimated Creatinine Clearance: 29.6 mL/min (by C-G formula based on SCr of 1 mg/dL).    Allergies  Allergen Reactions  . Clarithromycin     Other reaction(s): Abdominal Pain  . Oxycontin [Oxycodone Hcl] Other (See Comments)    Hallucinations   . Sulfamethoxazole-Trimethoprim     Other reaction(s): Other (See Comments) Chest pain    Antimicrobials this admission: Unasyn 4/18  >>     Dose adjustments this admission: 4/18 Unasyn transitioned to 3g IV Q6Hr  Microbiology results: 4/18 MRSA PCR: negative   Thank you for allowing pharmacy to be a part of this patient's care.  Simpson,Michael L 05/02/2017 8:52 PM

## 2017-05-02 NOTE — Progress Notes (Signed)
Rhino rocket dislodged from patient's nares. Dr. Jenne CampusMcQueen notified and stated that if patient had no further bleeding rhino rocket did not need to be replaced. No bleeding at this time, will continue to assess. Trudee KusterBrandi R Mansfield

## 2017-05-02 NOTE — Progress Notes (Signed)
Called to evaluate patient for profound nosebleed. Upon arrival, patient is bleeding from both nares despite packing with gauze. ICE packs applied. Neosynephrine nasal spray ordered from pharmacy and a generous amount sprayed into both nares. A 7.5cm nasal trumpet inserted into left nare. STAT ENT consult initiated. Spoke with Dr Andee PolesVaught; he will be in shortly to evaluate patient. Patient's son Mr. Urschel notified. Will continue to monitor. If patient is unable to maintain airway, a code blue will be initiated for intubation  Helon Wisinski S. [facility] ANP-BC Pulmonary and Critical Care Medicine [facility] Pager [phone] or [phone]  NB: This document was prepared using Dragon voice recognition software and may include unintentional dictation errors.

## 2017-05-03 LAB — HEMOGLOBIN AND HEMATOCRIT, BLOOD
HEMATOCRIT: 26.9 % — AB (ref 35.0–47.0)
HEMOGLOBIN: 9.1 g/dL — AB (ref 12.0–16.0)

## 2017-05-03 LAB — BASIC METABOLIC PANEL
Anion gap: 3 — ABNORMAL LOW (ref 5–15)
BUN: 29 mg/dL — AB (ref 6–20)
CO2: 26 mmol/L (ref 22–32)
CREATININE: 0.71 mg/dL (ref 0.44–1.00)
Calcium: 7.8 mg/dL — ABNORMAL LOW (ref 8.9–10.3)
Chloride: 118 mmol/L — ABNORMAL HIGH (ref 101–111)
GFR calc Af Amer: >60 mL/min (ref >60)
Glucose, Bld: 106 mg/dL — ABNORMAL HIGH (ref 65–99)
POTASSIUM: 3 mmol/L — AB (ref 3.5–5.1)
SODIUM: 147 mmol/L — AB (ref 135–145)

## 2017-05-03 LAB — CBC
HCT: 27.1 % — ABNORMAL LOW (ref 35.0–47.0)
Hemoglobin: 9.1 g/dL — ABNORMAL LOW (ref 12.0–16.0)
MCH: 31.2 pg (ref 26.0–34.0)
MCHC: 33.4 g/dL (ref 32.0–36.0)
MCV: 93.3 fL (ref 80.0–100.0)
PLATELETS: 113 10*3/uL — AB (ref 150–440)
RBC: 2.91 MIL/uL — AB (ref 3.80–5.20)
RDW: 16.1 % — AB (ref 11.5–14.5)
WBC: 17.5 10*3/uL — ABNORMAL HIGH (ref 3.6–11.0)

## 2017-05-03 LAB — VITAMIN B12: Vitamin B-12: 226 pg/mL (ref 180–914)

## 2017-05-03 LAB — PHOSPHORUS: Phosphorus: 2 mg/dL — ABNORMAL LOW (ref 2.5–4.6)

## 2017-05-03 LAB — MAGNESIUM: MAGNESIUM: 1.8 mg/dL (ref 1.7–2.4)

## 2017-05-03 MED ORDER — POTASSIUM CHLORIDE 20 MEQ PO PACK
60.0000 meq | PACK | Freq: Once | ORAL | Status: AC
  Administered 2017-05-03: 60 meq via ORAL

## 2017-05-03 MED ORDER — POTASSIUM PHOSPHATE MONOBASIC 500 MG PO TABS
500.0000 mg | ORAL_TABLET | Freq: Once | ORAL | Status: AC
  Administered 2017-05-03: 500 mg via ORAL
  Filled 2017-05-03: qty 1

## 2017-05-03 MED ORDER — K PHOS MONO-SOD PHOS DI & MONO 155-852-130 MG PO TABS
500.0000 mg | ORAL_TABLET | Freq: Two times a day (BID) | ORAL | Status: DC
  Administered 2017-05-03: 500 mg via ORAL
  Filled 2017-05-03 (×2): qty 2

## 2017-05-03 NOTE — Progress Notes (Signed)
Cephas Darby, MD [address]  [place]  [place], [place] [address]  Main: [phone]  Fax: [phone] Pager: [phone]   Subjective: Patient has been transferred to floor today. He had 1 bowel movement in the ICU today which was brownish black. Her hemoglobin dropped from 10.3-10.1 in last 24 hours. She is able to tolerate by mouth diet well. She is evaluated by speech pathology and there is no evidence of oropharyngeal dysphagia   Objective: Vital signs in last 24 hours: Vitals:   05/03/17 0800 05/03/17 0919 05/03/17 1000 05/03/17 1049  BP:  (!) 154/72 (!) 144/69 (!) 157/73  Pulse:  75 70 78  Resp:   (!) 22 20  Temp: 98.1 F (36.7 C)   98.4 F (36.9 C)  TempSrc: Oral   Oral  SpO2:   98% 94%  Weight:      Height:       Weight change: 4 lb 13.6 oz (2.2 kg)  Intake/Output Summary (Last 24 hours) at 05/03/2017 1742 Last data filed at 05/03/2017 1400 Gross per 24 hour  Intake 2400 ml  Output 500 ml  Net 1900 ml     Exam: Heart:: Regular rate and rhythm or S1S2 present Lungs: clear to auscultation Abdomen: soft, nontender, normal bowel sounds   Lab Results: CBC Latest Ref Rng & Units 05/03/2017 05/03/2017 05/02/2017  WBC 3.6 - 11.0 K/uL 17.5(H) - -  Hemoglobin 12.0 - 16.0 g/dL 9.1(L) 9.1(L) 10.3(L)  Hematocrit 35.0 - 47.0 % 27.1(L) 26.9(L) 30.5(L)  Platelets 150 - 440 K/uL 113(L) - -   BMP Latest Ref Rng & Units 05/03/2017 05/01/2017 05/01/2017  Glucose 65 - 99 mg/dL 106(H) 77 126(H)  BUN 6 - 20 mg/dL 29(H) 49(H) 44(H)  Creatinine 0.44 - 1.00 mg/dL 0.71 1.00 0.93  Sodium 135 - 145 mmol/L 147(H) 145 142  Potassium 3.5 - 5.1 mmol/L 3.0(L) 3.7 4.2  Chloride 101 - 111 mmol/L 118(H) 114(H) 114(H)  CO2 22 - 32 mmol/L _0 Calcium 8.9 - 10.3 mg/dL 7.8(L) 8.1(L) 8.0(L)   Hepatic Function Latest Ref Rng & Units 05/01/2017 04/16/2017 04/02/2017  Total Protein 6.5 - 8.1 g/dL 4.7(L) 5.9(L) 5.4(L)  Albumin 3.5 - 5.0 g/dL 2.6(L) 3.0(L) 2.5(L)  AST 15 - 41  U/L 34 22 25  ALT 14 - 54 U/L 13(L) 15 16  Alk Phosphatase 38 - 126 U/L 95 94 77  Total Bilirubin 0.3 - 1.2 mg/dL 0.5 0.6 0.5   Micro Results: Recent Results (from the past 240 hour(s))  MRSA PCR Screening     Status: None   Collection Time: 05/01/17  4:22 AM  Result Value Ref Range Status   MRSA by PCR NEGATIVE NEGATIVE Final    Comment:        The GeneXpert MRSA Assay (FDA approved for NASAL specimens only), is one component of a comprehensive MRSA colonization surveillance program. It is not intended to diagnose MRSA infection nor to guide or monitor treatment for MRSA infections. Performed at [facility], [address], [place], [place] [address]    Studies/Results: No results found. Medications: I have reviewed the patient's current medications. Scheduled Meds: . chlorhexidine  15 mL Mouth Rinse BID  . docusate sodium  100 mg Oral BID  . feeding supplement (ENSURE ENLIVE)  237 mL Oral BID BM  . mouth rinse  15 mL Mouth Rinse q12n4p  . metoprolol tartrate  25 mg Oral BID  . mupirocin ointment   Nasal BID  .  pantoprazole (PROTONIX) IV  40 mg Intravenous Q12H  . potassium phosphate (monobasic)  500 mg Oral Once   Continuous Infusions: . sodium chloride    . sodium chloride 100 mL/hr at 05/02/17 2112  . ampicillin-sulbactam (UNASYN) IV Stopped (05/03/17 1645)   PRN Meds:.acetaminophen **OR** acetaminophen, ondansetron **OR** ondansetron (ZOFRAN) IV, phenylephrine, sodium chloride   Assessment: Active Problems:   GI bleed   Acute gastrointestinal hemorrhage   Gastric irritation hemoglobin slightly dropped within last 24 hours but there is no evidence of active GI bleed  Plan: Patient does not have iron or B12 deficiency Avoid aspirin 325 mg and other NSAIDs including BC powder or Goody powder Protonix 40 mg twice daily for 3 months Currently receiving antibiotics for aspiration pneumonia Anticipate discharge home in 1-2 days Follow up with GI, Dr  Bonna Gains in 2-4 weeks after discharge    LOS: 2 days   [name] 05/03/2017, 5:42 PM

## 2017-05-03 NOTE — Progress Notes (Signed)
Speech Language Pathology Treatment: Dysphagia  Patient Details Name: Rita Vang MRN: [id number] DOB: 04/21/1924 Today's Date: 05/03/2017 Time: [id number] SLP Time Calculation (min) (ACUTE ONLY): 40 min  Assessment / Plan / Recommendation Clinical Impression  Pt seen today for ongoing assessment of toleration of diet; monitoring of safety w/ diet. Per NSG report, pt has been eating much at the recent meals once diet was initiated yesterday PM. Pt is able to feed herself w/ setup and min encouragement; the diet consistency appears easy for her overall.  Pt appears to present w/ adequate toleration of diet w/ no overt oropharyngeal phase swallow function deficits and is at reduced risk for aspiration when following general aspiration precautions. Pt does have a baseline of Dementia and GERD which can both impact swallowing function/motility; any Reflux or Regurgitation can increase risk for Reflux aspiration. Pt also had a recent GI bleed. Pt consumed a few po trials of thin liquids(mostly via straw) and purees w/ NSG w/ no overt s/s of aspiration noted; clear vocal quality noted post trials w/ no decline in respiratory status occurring. [facility] bolus management and clearing. Pt fed self w/ min setup assist.  Recommend continue a Pureed diet w/ thin liquids for easier oral intake at this time of her illness; conservation of energy and ease w/ oral phase and Cognitive effort/attention. Recommend general aspiration precautions; Pills in Puree Crushed for easier swallowing. ST services will f/u w/ pt's status and trials to upgrade diet consistency as appropriate for pt. Recommend f/u w/ GI for GERD questions/tx and how it impacts overall oral intake; f/u w/ Neurology and how Dementia can impact overall oral intake/desire; f/u w/ Dietician for support. NSG updated.     HPI HPI: Pt is a 84 yo female with a PMH of Dementia, Vertigo, HTN, GERD, CKD Stage III, Protein Calorie  Malnutrition, Hyperlipidemia, Recurrent UTI's, Depression, Dementia, CVA, and Anxiety. Per EMS, they were called out to pt's home for patient vomiting blood. Pt recently d/c'd from Rehab d/t deconditioning. Pt admitted with massive upper GI bleed and acute hypoxic respiratory failure presumed secondary to aspiration requiring mechanical intubation. Pt was extubated by the evening. However, d/t an episode of epistaxis, a 7.5cm rhinorocket placed in right nares. Pt awakened appropriately and seemed pleased to have po's w/ SLP. Talkative w/ need for min cues for questions, follow through w/ tasks. Pt pulled out the rhinorocket tube/packing and appears to be tolerating it well w/ no nose bleeding per NSG..      SLP Plan  Continue with current plan of care(w/ trials to upgrade next 1-2 days )       Recommendations  Diet recommendations: Dysphagia 1 (puree);Thin liquid Liquids provided via: Cup;Straw Medication Administration: Crushed with puree(as able or in liquid, chewable forms) Supervision: Patient able to self feed;Intermittent supervision to cue for compensatory strategies(tray setup and encouragement) Compensations: Minimize environmental distractions;Slow rate;Small sips/bites;Lingual sweep for clearance of pocketing;Multiple dry swallows after each bite/sip;Follow solids with liquid Postural Changes and/or Swallow Maneuvers: Seated upright 90 degrees;Upright 30-60 min after meal                General recommendations: (Dietician f/u for supplements as needed) Oral Care Recommendations: Oral care BID;Staff/trained caregiver to provide oral care Follow up Recommendations: None SLP Visit Diagnosis: Dysphagia, unspecified (R13.10)(Cognitive decline) Plan: Continue with current plan of care(w/ trials to upgrade next 1-2 days )       GO  Rita Som, MS, CCC-SLP Vang,Rita 05/03/2017, 11:02 AM

## 2017-05-03 NOTE — Progress Notes (Signed)
Name: Rita Vang MRN: [id number] DOB: 1924/12/05    ADMISSION DATE:  05/01/2017 CONSULTATION DATE: 05/01/2017  REFERRING MD : Dr. Sheryle Hail   CHIEF COMPLAINT: Hematemesis   BRIEF PATIENT DESCRIPTION:  84 yo female admitted with massive upper GI bleed and acute hypoxic respiratory failure presumed secondary to aspiration requiring mechanical intubation   SIGNIFICANT EVENTS  04/18-Pt admitted to ICU with upper GI bleed mechanically intubated, extubated after EGD 4/19 epistaxis Rhino Rocket placed  STUDIES:  None   HISTORY OF PRESENT ILLNESS:   This is a 84 yo female with a PMH Vertigo, HTN, CKD Stage III, Protein Calorie Malnutrition, Hyperlipidemia, Recurrent UTI's, GERD, Depression, Dementia, CVA, Cerebral Infarction, and Anxiety. She presented to [facility] ER on 04/18 via EMS from home with hematemesis with large clots and decreased responsiveness.  She was discharged from the [facility] after receiving rehab due to generalized weakness and deconditioning following hospitalization secondary to pneumonia.  Upon EMS arrival at pts home her O2 sats were in the 60's on RA and pt somnolent, therefore she was placed on NRB.  In the ER pt required mechanical intubation due to hypoxia suspected secondary to aspiration. Massive transfusion protocol initiated, pt received 2 units of platelet pheresis, tranexamic acid, protonix, and on call gastroenterologist contacted by ER physician. Lab results revealed BUN 29, creatinine 1.10, lactic acid 7.6, wbc 24.2, hgb 8.6, and platelets 197.  She was subsequently admitted to ICU by hospitalist team for further workup and treatment PCCM consulted for vent management.   SUBJECTIVE EXTUBATED AFTER  EGD 4/17 No real source of bleeding Acute severe epistaxis 4/19 Rhino rocket placed ENT consulted Patient removed rhino rocket, No further bleeding Pleasantly confused  ROS Alert and awake NAD No fevers No SOB All other ROS negative  VITAL  SIGNS: Temp:  [97.7 F (36.5 C)-100 F (37.8 C)] 98.1 F (36.7 C) (04/20 0137) Pulse Rate:  [59-104] 60 (04/20 0500) Resp:  [18-27] 23 (04/20 0500) BP: (136-170)/(59-93) 161/74 (04/20 0500) SpO2:  [92 %-100 %] 98 % (04/20 0500) Weight:  [132 lb 4.4 oz (60 kg)] 132 lb 4.4 oz (60 kg) (04/20 0459)  PHYSICAL EXAMINATION: General: nad, Pleasant Neuro: follows commands, PERRL  HEENT: supple, no JVD  Cardiovascular: sinus tach, no M/R/G Lungs: bilateral breath sounds diminished in the bases, improved rhonchi; even, non labored  Abdomen: +BS x4, Musculoskeletal: normal bulk and tone, no edema  Skin: intact no rashes or lesions   Recent Labs  Lab 05/01/17 1017 05/01/17 2225 05/03/17 0353  NA 142 145 147*  K 4.2 3.7 3.0*  CL 114* 114* 118*  CO2 22 24 26   BUN 44* 49* 29*  CREATININE 0.93 1.00 0.71  GLUCOSE 126* 77 106*   Recent Labs  Lab 05/01/17 1020  05/02/17 0427 05/02/17 1619 05/03/17 0353  HGB 11.5*   < > 10.6* 10.3* 9.1*  9.1*  HCT 34.1*   < > 31.9* 30.5* 27.1*  26.9*  WBC 22.9*  --  21.8*  --  17.5*  PLT 110*  --  128*  --  113*   < > = values in this interval not displayed.    Anti-infectives (From admission, onward)   Start     Dose/Rate Route Frequency Ordered Stop   05/01/17 1200  Ampicillin-Sulbactam (UNASYN) 3 g in sodium chloride 0.9 % 100 mL IVPB     3 g 200 mL/hr over 30 Minutes Intravenous Every 6 hours 05/01/17 1109     05/01/17 0600  Ampicillin-Sulbactam (  UNASYN) 3 g in sodium chloride 0.9 % 100 mL IVPB  Status:  Discontinued     3 g 200 mL/hr over 30 Minutes Intravenous Every 12 hours 05/01/17 0525 05/01/17 1109       ASSESSMENT / PLAN: Upper GI Bleed -no obvious source of bleeding-s/p hemostat clips for possible old lesion Extubated to RA developed acute epistaxis Acute blood loss anemia  Hx: Dementia, CVA, Cerebral Infarction, CVA, GERD, and HTN  P: Continuous telemetry monitoring  Trend BMP  Replace electrolytes as indicated  Monitor  UOP  Monitor for GI and nasal bleeding  Moisturize nares bid Transfuse for hgb <7 and/or active s/sx of bleeding  IV protonix  SCD's for VTE prophylaxis, avoid chemical prophylaxis  GI consulted appreciate input   [name] stable for transfer out of the unit [facility] ANP-BC Pulmonary and Critical Care Medicine [facility] Pager [phone] or [phone]  NB: This document was prepared using Dragon voice recognition software and may include unintentional dictation errors.

## 2017-05-03 NOTE — Progress Notes (Signed)
[facility] - [place] [facility]   PATIENT NAME: Rita BlueFrances Macmillan    MR#:  [id number]  DATE OF BIRTH:  12/31/1924  SUBJECTIVE:  CHIEF COMPLAINT:   Chief Complaint  Patient presents with  . Hematemesis   Patient presently confused.  Had severe epistaxis earlier.  Rhino pocket place.  REVIEW OF SYSTEMS:    Review of Systems  Unable to perform ROS: Dementia    DRUG ALLERGIES:   Allergies  Allergen Reactions  . Clarithromycin     Other reaction(s): Abdominal Pain  . Oxycontin [Oxycodone Hcl] Other (See Comments)    Hallucinations   . Sulfamethoxazole-Trimethoprim     Other reaction(s): Other (See Comments) Chest pain    VITALS:  Blood pressure (!) 157/73, pulse 78, temperature 98.4 F (36.9 C), temperature source Oral, resp. rate 20, height 5\' 2"  (1.575 m), weight 60 kg (132 lb 4.4 oz), SpO2 94 %.  PHYSICAL EXAMINATION:   Physical Exam  GENERAL:  84 y.o.-year-old patient lying in the bed with no acute distress.  EYES: Pupils equal, round, reactive to light and accommodation. No scleral icterus. Extraocular muscles intact.  HEENT: Head atraumatic, normocephalic.  Dry blood around nose NECK:  Supple, no jugular venous distention. No thyroid enlargement, no tenderness.  LUNGS: Normal breath sounds bilaterally, no wheezing, rales, rhonchi. No use of accessory muscles of respiration.  CARDIOVASCULAR: S1, S2 normal. No murmurs, rubs, or gallops.  ABDOMEN: Soft, nontender, nondistended. Bowel sounds present. No organomegaly or mass.  EXTREMITIES: No cyanosis, clubbing or edema b/l.    NEUROLOGIC: Moves all 4 extremities.   PSYCHIATRIC: The patient is pleasantly confused SKIN: No obvious rash, lesion, or ulcer.   LABORATORY PANEL:   CBC Recent Labs  Lab 05/03/17 0353  WBC 17.5*  HGB 9.1*  9.1*  HCT 27.1*  26.9*  PLT 113*    ------------------------------------------------------------------------------------------------------------------ Chemistries  Recent Labs  Lab 05/01/17 0225  05/03/17 0353  NA 142   < > 147*  K 4.0   < > 3.0*  CL 110   < > 118*  CO2 20*   < > 26  GLUCOSE 245*   < > 106*  BUN 29*   < > 29*  CREATININE 1.10*   < > 0.71  CALCIUM 8.3*   < > 7.8*  MG  --    < > 1.8  AST 34  --   --   ALT 13*  --   --   ALKPHOS 95  --   --   BILITOT 0.5  --   --    < > = values in this interval not displayed.   ------------------------------------------------------------------------------------------------------------------  Cardiac Enzymes Recent Labs  Lab 05/01/17 0225  TROPONINI <0.03   ------------------------------------------------------------------------------------------------------------------  RADIOLOGY:  No results found.   ASSESSMENT AND PLAN:   * Upper GI Bleed -no obvious source of bleeding-s/p hemostat clips for possible old lesion Protonix BID Monitor Hb  *Ventilator dependent respiratory failure.  Extubated and doing well.  * Acute blood loss anemia secondary to GI bleed.   Status post blood transfusion.  Stabilized.    *Epistaxis.  Had a rhino pocket.  Removed by patient.  No further bleeding.  * Dementia  *DVT prophylaxis with SCDs    All the records are reviewed and case discussed with Care Management/Social Worker Management plans discussed with the patient, family and they are in agreement.  CODE STATUS: FULL CODE  DVT Prophylaxis: SCDs  TOTAL TIME TAKING CARE OF THIS PATIENT: 35  minutes.   POSSIBLE D/C IN 2-3 DAYS, DEPENDING ON CLINICAL CONDITION.  [name] Janelie Goltz M.D on 05/03/2017 at 12:02 PM  Between 7am to 6pm - Pager - [phone]  After 6pm go to [email or web address] - password [email or web address]  [facility] [place] [facility]  Office  [phone]  CC: Primary care physician; Mick Sell, MD  Note: This dictation was prepared with Dragon  dictation along with smaller phrase technology. Any transcriptional errors that result from this process are unintentional.

## 2017-05-04 ENCOUNTER — Inpatient Hospital Stay: Payer: Medicare Other

## 2017-05-04 LAB — CBC
HEMATOCRIT: 29 % — AB (ref 35.0–47.0)
Hemoglobin: 9.7 g/dL — ABNORMAL LOW (ref 12.0–16.0)
MCH: 31.6 pg (ref 26.0–34.0)
MCHC: 33.4 g/dL (ref 32.0–36.0)
MCV: 94.7 fL (ref 80.0–100.0)
PLATELETS: 116 10*3/uL — AB (ref 150–440)
RBC: 3.06 MIL/uL — ABNORMAL LOW (ref 3.80–5.20)
RDW: 16.1 % — AB (ref 11.5–14.5)
WBC: 17.2 10*3/uL — AB (ref 3.6–11.0)

## 2017-05-04 MED ORDER — POTASSIUM CHLORIDE CRYS ER 20 MEQ PO TBCR
20.0000 meq | EXTENDED_RELEASE_TABLET | Freq: Every day | ORAL | Status: DC
  Administered 2017-05-04 – 2017-05-05 (×2): 20 meq via ORAL
  Filled 2017-05-04 (×2): qty 1

## 2017-05-04 MED ORDER — MOMETASONE FURO-FORMOTEROL FUM 100-5 MCG/ACT IN AERO
2.0000 | INHALATION_SPRAY | Freq: Two times a day (BID) | RESPIRATORY_TRACT | Status: DC
  Administered 2017-05-04 – 2017-05-07 (×7): 2 via RESPIRATORY_TRACT
  Filled 2017-05-04: qty 8.8

## 2017-05-04 MED ORDER — QUETIAPINE FUMARATE 25 MG PO TABS
25.0000 mg | ORAL_TABLET | Freq: Every day | ORAL | Status: DC
  Administered 2017-05-04 – 2017-05-06 (×3): 25 mg via ORAL
  Filled 2017-05-04 (×3): qty 1

## 2017-05-04 MED ORDER — IPRATROPIUM BROMIDE HFA 17 MCG/ACT IN AERS: 2.0000 | INHALATION_SPRAY | Freq: Three times a day (TID) | RESPIRATORY_TRACT | Status: DC

## 2017-05-04 MED ORDER — GLYCOPYRROLATE 1 MG PO TABS
1.0000 mg | ORAL_TABLET | Freq: Every day | ORAL | Status: DC
  Administered 2017-05-04 – 2017-05-07 (×4): 1 mg via ORAL
  Filled 2017-05-04 (×4): qty 1

## 2017-05-04 MED ORDER — TRAMADOL HCL 50 MG PO TABS
50.0000 mg | ORAL_TABLET | Freq: Two times a day (BID) | ORAL | Status: DC
  Administered 2017-05-04 – 2017-05-05 (×2): 50 mg via ORAL
  Filled 2017-05-04 (×2): qty 1

## 2017-05-04 MED ORDER — PANTOPRAZOLE SODIUM 40 MG PO TBEC
40.0000 mg | DELAYED_RELEASE_TABLET | Freq: Two times a day (BID) | ORAL | Status: DC
  Administered 2017-05-05 – 2017-05-07 (×5): 40 mg via ORAL
  Filled 2017-05-04 (×5): qty 1

## 2017-05-04 MED ORDER — FLUTICASONE PROPIONATE 50 MCG/ACT NA SUSP
1.0000 | Freq: Every day | NASAL | Status: DC
  Administered 2017-05-04 – 2017-05-07 (×4): 1 via NASAL
  Filled 2017-05-04: qty 16

## 2017-05-04 MED ORDER — IPRATROPIUM-ALBUTEROL 0.5-2.5 (3) MG/3ML IN SOLN
3.0000 mL | Freq: Four times a day (QID) | RESPIRATORY_TRACT | Status: DC
  Administered 2017-05-04 (×3): 3 mL via RESPIRATORY_TRACT
  Filled 2017-05-04 (×2): qty 3

## 2017-05-04 MED ORDER — SIMVASTATIN 20 MG PO TABS
20.0000 mg | ORAL_TABLET | Freq: Every day | ORAL | Status: DC
  Administered 2017-05-04 – 2017-05-07 (×4): 20 mg via ORAL
  Filled 2017-05-04 (×4): qty 1

## 2017-05-04 MED ORDER — MAGNESIUM OXIDE 400 (241.3 MG) MG PO TABS
200.0000 mg | ORAL_TABLET | Freq: Every day | ORAL | Status: DC
  Administered 2017-05-04 – 2017-05-05 (×2): 200 mg via ORAL
  Filled 2017-05-04 (×2): qty 1

## 2017-05-04 MED ORDER — FUROSEMIDE 10 MG/ML IJ SOLN
40.0000 mg | Freq: Two times a day (BID) | INTRAMUSCULAR | Status: AC
  Administered 2017-05-04 – 2017-05-05 (×4): 40 mg via INTRAVENOUS
  Filled 2017-05-04 (×4): qty 4

## 2017-05-04 MED ORDER — GALANTAMINE HYDROBROMIDE 4 MG PO TABS
4.0000 mg | ORAL_TABLET | Freq: Two times a day (BID) | ORAL | Status: DC
  Administered 2017-05-04 – 2017-05-07 (×6): 4 mg via ORAL
  Filled 2017-05-04 (×7): qty 1

## 2017-05-04 MED ORDER — METHYLPREDNISOLONE SODIUM SUCC 125 MG IJ SOLR
60.0000 mg | Freq: Every day | INTRAMUSCULAR | Status: AC
  Administered 2017-05-04 – 2017-05-06 (×3): 60 mg via INTRAVENOUS
  Filled 2017-05-04 (×3): qty 2

## 2017-05-04 MED ORDER — CITALOPRAM HYDROBROMIDE 20 MG PO TABS
20.0000 mg | ORAL_TABLET | Freq: Every day | ORAL | Status: DC
  Administered 2017-05-04 – 2017-05-07 (×4): 20 mg via ORAL
  Filled 2017-05-04 (×4): qty 1

## 2017-05-04 MED ORDER — DONEPEZIL HCL 5 MG PO TABS
10.0000 mg | ORAL_TABLET | Freq: Every day | ORAL | Status: DC
  Administered 2017-05-04 – 2017-05-06 (×3): 10 mg via ORAL
  Filled 2017-05-04 (×2): qty 2
  Filled 2017-05-04: qty 1
  Filled 2017-05-04: qty 2

## 2017-05-04 NOTE — Progress Notes (Signed)
[facility] - [place] [facility]   PATIENT NAME: Rita BlueFrances Vang    MR#:  [id number]  DATE OF BIRTH:  03/20/1924  SUBJECTIVE:  CHIEF COMPLAINT:   Chief Complaint  Patient presents with  . Hematemesis   Confused. No further bleeding  REVIEW OF SYSTEMS:    Review of Systems  Unable to perform ROS: Dementia    DRUG ALLERGIES:   Allergies  Allergen Reactions  . Clarithromycin     Other reaction(s): Abdominal Pain  . Oxycontin [Oxycodone Hcl] Other (See Comments)    Hallucinations   . Sulfamethoxazole-Trimethoprim     Other reaction(s): Other (See Comments) Chest pain    VITALS:  Blood pressure (!) 156/81, pulse 84, temperature 98.1 F (36.7 C), temperature source Oral, resp. rate 18, height 5\' 2"  (1.575 m), weight 67.6 kg (149 lb), SpO2 94 %.  PHYSICAL EXAMINATION:   Physical Exam  GENERAL:  84 y.o.-year-old patient lying in the bed with no acute distress.  EYES: Pupils equal, round, reactive to light and accommodation. No scleral icterus. Extraocular muscles intact.  HEENT: Head atraumatic, normocephalic.  NECK:  Supple, no jugular venous distention. No thyroid enlargement, no tenderness.  LUNGS: Normal breath sounds bilaterally, no wheezing, rales, rhonchi. No use of accessory muscles of respiration.  CARDIOVASCULAR: S1, S2 normal. No murmurs, rubs, or gallops.  ABDOMEN: Soft, nontender, nondistended. Bowel sounds present. No organomegaly or mass.  EXTREMITIES: No cyanosis, clubbing or edema b/l.    NEUROLOGIC: Moves all 4 extremities.   PSYCHIATRIC: The patient is pleasantly confused SKIN: No obvious rash, lesion, or ulcer.   LABORATORY PANEL:   CBC Recent Labs  Lab 05/04/17 0406  WBC 17.2*  HGB 9.7*  HCT 29.0*  PLT 116*   ------------------------------------------------------------------------------------------------------------------ Chemistries  Recent Labs  Lab 05/01/17 0225  05/03/17 0353  NA 142   < > 147*  K 4.0   < >  3.0*  CL 110   < > 118*  CO2 20*   < > 26  GLUCOSE 245*   < > 106*  BUN 29*   < > 29*  CREATININE 1.10*   < > 0.71  CALCIUM 8.3*   < > 7.8*  MG  --    < > 1.8  AST 34  --   --   ALT 13*  --   --   ALKPHOS 95  --   --   BILITOT 0.5  --   --    < > = values in this interval not displayed.   ------------------------------------------------------------------------------------------------------------------  Cardiac Enzymes Recent Labs  Lab 05/01/17 0225  TROPONINI <0.03   ------------------------------------------------------------------------------------------------------------------  RADIOLOGY:  Dg Chest 2 View  Result Date: 05/04/2017 CLINICAL DATA:  Hypoxia today.  Possible aspiration pneumonia. EXAM: CHEST - 2 VIEW COMPARISON:  Single-view of the chest 05/01/2017 and 03/09/2017. FINDINGS: There is interstitial pulmonary edema. Moderate bilateral pleural effusions and basilar airspace disease are worse on the left. Heart size is upper normal. Extensive atherosclerotic vascular disease is present. Remote left rib fractures are noted. IMPRESSION: Interstitial pulmonary edema. Left worse than right effusions and airspace disease. Airspace disease could be due to atelectasis or pneumonia. The appearance is not typical of aspiration. Atherosclerosis. Electronically Signed   By: Drusilla Kannerhomas  Dalessio M.D.   On: 05/04/2017 11:02     ASSESSMENT AND PLAN:   * Upper GI Bleed -no obvious source of bleeding-s/p hemostat clips for possible old lesion Protonix BID Monitor Hb Transfer out of ICU  *Ventilator dependent  respiratory failure.  Extubated and doing well.  * Acute blood loss anemia secondary to GI bleed.   Status post blood transfusion.  Stabilized.    *Epistaxis.  Had a rhino pocket.  Removed by patient.  No further bleeding.  * Dementia  *DVT prophylaxis with SCDs    All the records are reviewed and case discussed with Care Management/Social Worker Management plans  discussed with the patient, family and they are in agreement.  CODE STATUS: FULL CODE  DVT Prophylaxis: SCDs  TOTAL TIME TAKING CARE OF THIS PATIENT: 35 minutes.   POSSIBLE D/C IN 2-3 DAYS, DEPENDING ON CLINICAL CONDITION.  Orie Fisherman M.D on 05/04/2017 at 11:18 AM  Between 7am to 6pm - Pager - [phone]  After 6pm go to [email or web address] - password [email or web address] [facility]  [facility] [place] [facility]  Office  [phone]  CC: Primary care physician; Mick Sell, MD  Note: This dictation was prepared with Dragon dictation along with smaller phrase technology. Any transcriptional errors that result from this process are unintentional.

## 2017-05-04 NOTE — Progress Notes (Signed)
SOUND Physicians - [place] at [facility]   PATIENT NAME: Rita Vang    MR#:  [id number]  DATE OF BIRTH:  [birth year]  SUBJECTIVE:  CHIEF COMPLAINT:   Chief Complaint  Patient presents with  . Hematemesis   Confused.  NO bleeding Poor PO intake On 2L O2  Patient has audible wheezing and is on oxygen.  Conversational dyspnea  Patient's caregiver from home at bedside  REVIEW OF SYSTEMS:    Review of Systems  Unable to perform ROS: Dementia    DRUG ALLERGIES:   Allergies  Allergen Reactions  . Clarithromycin     Other reaction(s): Abdominal Pain  . Oxycontin [Oxycodone Hcl] Other (See Comments)    Hallucinations   . Sulfamethoxazole-Trimethoprim     Other reaction(s): Other (See Comments) Chest pain    VITALS:  Blood pressure (!) 156/81, pulse 84, temperature 98.1 F (36.7 C), temperature source Oral, resp. rate 18, height 5\' 2"  (1.575 m), weight 67.6 kg (149 lb), SpO2 94 %.  PHYSICAL EXAMINATION:   Physical Exam  GENERAL:  84 y.o.-year-old patient lying in the bed with conversational dyspnea EYES: Pupils equal, round, reactive to light and accommodation. No scleral icterus. Extraocular muscles intact.  HEENT: Head atraumatic, normocephalic.  NECK:  Supple, no jugular venous distention. No thyroid enlargement, no tenderness.  LUNGS: bilateral wheezing and decreased air entry CARDIOVASCULAR: S1, S2 normal. No murmurs, rubs, or gallops.  ABDOMEN: Soft, nontender, nondistended. Bowel sounds present. No organomegaly or mass.  EXTREMITIES: No cyanosis, clubbing or edema b/l.    NEUROLOGIC: Moves all 4 extremities.   PSYCHIATRIC: The patient is pleasantly confused SKIN: No obvious rash, lesion, or ulcer.   LABORATORY PANEL:   CBC Recent Labs  Lab 05/04/17 0406  WBC 17.2*  HGB 9.7*  HCT 29.0*  PLT 116*   ------------------------------------------------------------------------------------------------------------------ Chemistries  Recent  Labs  Lab 05/01/17 0225  05/03/17 0353  NA 142   < > 147*  K 4.0   < > 3.0*  CL 110   < > 118*  CO2 20*   < > 26  GLUCOSE 245*   < > 106*  BUN 29*   < > 29*  CREATININE 1.10*   < > 0.71  CALCIUM 8.3*   < > 7.8*  MG  --    < > 1.8  AST 34  --   --   ALT 13*  --   --   ALKPHOS 95  --   --   BILITOT 0.5  --   --    < > = values in this interval not displayed.   ------------------------------------------------------------------------------------------------------------------  Cardiac Enzymes Recent Labs  Lab 05/01/17 0225  TROPONINI <0.03   ------------------------------------------------------------------------------------------------------------------  RADIOLOGY:  Dg Chest 2 View  Result Date: 05/04/2017 CLINICAL DATA:  Hypoxia today.  Possible aspiration pneumonia. EXAM: CHEST - 2 VIEW COMPARISON:  Single-view of the chest 05/01/2017 and 03/09/2017. FINDINGS: There is interstitial pulmonary edema. Moderate bilateral pleural effusions and basilar airspace disease are worse on the left. Heart size is upper normal. Extensive atherosclerotic vascular disease is present. Remote left rib fractures are noted. IMPRESSION: Interstitial pulmonary edema. Left worse than right effusions and airspace disease. Airspace disease could be due to atelectasis or pneumonia. The appearance is not typical of aspiration. Atherosclerosis. Electronically Signed   By: Drusilla Kanner M.D.   On: 05/04/2017 11:02     ASSESSMENT AND PLAN:   *COPD exacerbation with acute hypoxic respiratory failure We will give stat dose of  IV steroids and initiate nebulizers.  Check chest x-ray. Resume home inhalers.  * Upper GI Bleed -no obvious source of bleeding-s/p hemostat clips for possible old lesion Protonix BID Monitor Hb Transfer out of ICU  *Ventilator dependent respiratory failure.  Extubated and doing well.  * Acute blood loss anemia secondary to GI bleed.   Status post blood transfusion.   Stabilized.    *Epistaxis.  Had a rhino pocket.  Removed by patient.  No further bleeding.  * Dementia  *DVT prophylaxis with SCDs    All the records are reviewed and case discussed with Care Management/Social Worker Management plans discussed with the patient, family and they are in agreement.  CODE STATUS: FULL CODE  DVT Prophylaxis: SCDs  TOTAL CRITICAL CARE TIME TAKING CARE OF THIS PATIENT: 35 minutes.   POSSIBLE D/C IN 2-3 DAYS, DEPENDING ON CLINICAL CONDITION.  Orie FishermanSrikar R Ataya Murdy M.D on 05/04/2017 at 11:19 AM  Between 7am to 6pm - Pager - [phone]  After 6pm go to [email or web address] - password [email or web address] [facility]  [facility] [place] [facility]  Office  [phone]  CC: Primary care physician; Mick SellFitzgerald, David P, MD  Note: This dictation was prepared with Dragon dictation along with smaller phrase technology. Any transcriptional errors that result from this process are unintentional.

## 2017-05-04 NOTE — Progress Notes (Signed)
Rita Darby, MD [address]  [place]  [place], [place] [address]  Main: [phone]  Fax: [phone] Pager: [phone]   Subjective: No further episodes of bleeding.She did not have BM in last 24 hours. She developed mild respiratory distress secondary to pulmonary edema from volume overload and possible COPD exacerbation Her hemoglobin has been stable. [name] started on diuretics, nebulizers and steroids. Her son is bedside and she is comfortable   Objective: Vital signs in last 24 hours: Vitals:   05/04/17 0720 05/04/17 0722 05/04/17 1056 05/04/17 1626  BP:  (!) 156/81  (!) 156/69  Pulse:  84  71  Resp:  18  18  Temp:  98.1 F (36.7 C)  98.2 F (36.8 C)  TempSrc:  Oral  Oral  SpO2: (!) 88% 94% 94% 96%  Weight:      Height:       Weight change: 16 lb 11.6 oz (7.586 kg)  Intake/Output Summary (Last 24 hours) at 05/04/2017 1859 Last data filed at 05/04/2017 1644 Gross per 24 hour  Intake -  Output 2550 ml  Net -2550 ml     Exam: Heart:: Regular rate and rhythm or S1S2 present Lungs: clear to auscultation Abdomen: soft, nontender, normal bowel sounds   Lab Results: CBC Latest Ref Rng & Units 05/04/2017 05/03/2017 05/03/2017  WBC 3.6 - 11.0 K/uL 17.2(H) 17.5(H) -  Hemoglobin 12.0 - 16.0 g/dL 9.7(L) 9.1(L) 9.1(L)  Hematocrit 35.0 - 47.0 % 29.0(L) 27.1(L) 26.9(L)  Platelets 150 - 440 K/uL 116(L) 113(L) -   BMP Latest Ref Rng & Units 05/03/2017 05/01/2017 05/01/2017  Glucose 65 - 99 mg/dL 106(H) 77 126(H)  BUN 6 - 20 mg/dL 29(H) 49(H) 44(H)  Creatinine 0.44 - 1.00 mg/dL 0.71 1.00 0.93  Sodium 135 - 145 mmol/L 147(H) 145 142  Potassium 3.5 - 5.1 mmol/L 3.0(L) 3.7 4.2  Chloride 101 - 111 mmol/L 118(H) 114(H) 114(H)  CO2 22 - 32 mmol/L 26 24 22   Calcium 8.9 - 10.3 mg/dL 7.8(L) 8.1(L) 8.0(L)   Hepatic Function Latest Ref Rng & Units 05/01/2017 04/16/2017 04/02/2017  Total Protein 6.5 - 8.1 g/dL 4.7(L) 5.9(L) 5.4(L)  Albumin 3.5 - 5.0 g/dL 2.6(L)  3.0(L) 2.5(L)  AST 15 - 41 U/L 34 22 25  ALT 14 - 54 U/L 13(L) 15 16  Alk Phosphatase 38 - 126 U/L 95 94 77  Total Bilirubin 0.3 - 1.2 mg/dL 0.5 0.6 0.5   Micro Results: Recent Results (from the past 240 hour(s))  MRSA PCR Screening     Status: None   Collection Time: 05/01/17  4:22 AM  Result Value Ref Range Status   MRSA by PCR NEGATIVE NEGATIVE Final    Comment:        The GeneXpert MRSA Assay (FDA approved for NASAL specimens only), is one component of a comprehensive MRSA colonization surveillance program. It is not intended to diagnose MRSA infection nor to guide or monitor treatment for MRSA infections. Performed at [facility], [address] [place],  [address]    Studies/Results: Dg Chest 2 View  Result Date: 05/04/2017 CLINICAL DATA:  Hypoxia today.  Possible aspiration pneumonia. EXAM: CHEST - 2 VIEW COMPARISON:  Single-view of the chest 05/01/2017 and 03/09/2017. FINDINGS: There is interstitial pulmonary edema. Moderate bilateral pleural effusions and basilar airspace disease are worse on the left. Heart size is upper normal. Extensive atherosclerotic vascular disease is present. Remote left rib fractures are noted. IMPRESSION: Interstitial pulmonary edema. Left worse  than right effusions and airspace disease. Airspace disease could be due to atelectasis or pneumonia. The appearance is not typical of aspiration. Atherosclerosis. Electronically Signed   By: Inge Rise M.D.   On: 05/04/2017 11:02   Medications: I have reviewed the patient's current medications. Scheduled Meds: . chlorhexidine  15 mL Mouth Rinse BID  . citalopram  20 mg Oral Daily  . docusate sodium  100 mg Oral BID  . donepezil  10 mg Oral QHS  . feeding supplement (ENSURE ENLIVE)  237 mL Oral BID BM  . fluticasone  1 spray Each Nare Daily  . furosemide  40 mg Intravenous BID  . galantamine  4 mg Oral BID WC  . glycopyrrolate  1 mg Oral Daily  . ipratropium-albuterol  3 mL  Nebulization Q6H  . magnesium oxide  200 mg Oral Daily  . mouth rinse  15 mL Mouth Rinse q12n4p  . methylPREDNISolone (SOLU-MEDROL) injection  60 mg Intravenous Daily  . metoprolol tartrate  25 mg Oral BID  . mometasone-formoterol  2 puff Inhalation BID  . mupirocin ointment   Nasal BID  . [START ON 05/05/2017] pantoprazole  40 mg Oral BID AC  . potassium chloride SA  20 mEq Oral Daily  . QUEtiapine  25 mg Oral QHS  . simvastatin  20 mg Oral Daily  . traMADol  50 mg Oral BID   Continuous Infusions: . ampicillin-sulbactam (UNASYN) IV 3 g (05/04/17 1846)   PRN Meds:.acetaminophen **OR** acetaminophen, ondansetron **OR** ondansetron (ZOFRAN) IV, sodium chloride   Assessment: Active Problems:   GI bleed   Acute gastrointestinal hemorrhage   Gastric irritation hemoglobin has been stable  Plan: Patient does not have iron or B12 deficiency Avoid aspirin 325 mg and other NSAIDs including BC powder or Goody powder Protonix 40 mg twice daily for 3 months Currently receiving antibiotics for aspiration pneumonia Anticipate discharge home in 1-2 days Follow up with GI, Dr Bonna Gains in 2-4 weeks after discharge  GI will sign off, please call us back with questions/concerns    LOS: 3 days   Rita Vang 05/04/2017, 6:59 PM

## 2017-05-05 LAB — HEMOGLOBIN: HEMOGLOBIN: 8.3 g/dL — AB (ref 12.0–16.0)

## 2017-05-05 LAB — PHOSPHORUS: PHOSPHORUS: 2.3 mg/dL — AB (ref 2.5–4.6)

## 2017-05-05 LAB — BASIC METABOLIC PANEL
Anion gap: 9 (ref 5–15)
BUN: 21 mg/dL — AB (ref 6–20)
CALCIUM: 8.1 mg/dL — AB (ref 8.9–10.3)
CO2: 29 mmol/L (ref 22–32)
Chloride: 102 mmol/L (ref 101–111)
Creatinine, Ser: 0.86 mg/dL (ref 0.44–1.00)
GFR, EST NON AFRICAN AMERICAN: 58 mL/min — AB (ref >60)
Glucose, Bld: 124 mg/dL — ABNORMAL HIGH (ref 65–99)
Potassium: 3 mmol/L — ABNORMAL LOW (ref 3.5–5.1)
SODIUM: 140 mmol/L (ref 135–145)

## 2017-05-05 LAB — MAGNESIUM: MAGNESIUM: 1.5 mg/dL — AB (ref 1.7–2.4)

## 2017-05-05 MED ORDER — AMOXICILLIN-POT CLAVULANATE 400-57 MG/5ML PO SUSR
800.0000 mg | Freq: Two times a day (BID) | ORAL | Status: DC
  Administered 2017-05-05 – 2017-05-07 (×4): 800 mg via ORAL
  Filled 2017-05-05 (×5): qty 10

## 2017-05-05 MED ORDER — IPRATROPIUM-ALBUTEROL 0.5-2.5 (3) MG/3ML IN SOLN: 3.0000 mL | Freq: Four times a day (QID) | RESPIRATORY_TRACT | Status: DC | PRN

## 2017-05-05 MED ORDER — K PHOS MONO-SOD PHOS DI & MONO 155-852-130 MG PO TABS
500.0000 mg | ORAL_TABLET | ORAL | Status: AC
  Administered 2017-05-05 (×2): 500 mg via ORAL
  Filled 2017-05-05 (×2): qty 2

## 2017-05-05 MED ORDER — MAGNESIUM SULFATE 2 GM/50ML IV SOLN
2.0000 g | Freq: Once | INTRAVENOUS | Status: AC
  Administered 2017-05-05: 2 g via INTRAVENOUS
  Filled 2017-05-05: qty 50

## 2017-05-05 MED ORDER — IPRATROPIUM-ALBUTEROL 0.5-2.5 (3) MG/3ML IN SOLN
3.0000 mL | Freq: Three times a day (TID) | RESPIRATORY_TRACT | Status: DC
  Administered 2017-05-05 – 2017-05-07 (×8): 3 mL via RESPIRATORY_TRACT
  Filled 2017-05-05 (×8): qty 3

## 2017-05-05 MED ORDER — TRAMADOL HCL 50 MG PO TABS: 50.0000 mg | ORAL_TABLET | Freq: Two times a day (BID) | ORAL | Status: DC | PRN

## 2017-05-05 MED ORDER — POTASSIUM CHLORIDE CRYS ER 20 MEQ PO TBCR
40.0000 meq | EXTENDED_RELEASE_TABLET | ORAL | Status: AC
  Administered 2017-05-05 (×2): 40 meq via ORAL
  Filled 2017-05-05 (×2): qty 2

## 2017-05-05 NOTE — Clinical Social Work Note (Signed)
Clinical Social Work Assessment  Patient Details  Name: Rita Vang MRN: [id number] Date of Birth: 1924-04-07  Date of referral:  05/05/17               Reason for consult:  Facility Placement                Permission sought to share information with:  Chartered certified accountant granted to share information::  Yes, Verbal Permission Granted  Name::      [place]::   [place]   Relationship::     Contact Information:     Housing/Transportation Living arrangements for the past 2 months:  [place], [place] of Information:  Patient, Adult Children Patient Interpreter Needed:  None Criminal Activity/Legal Involvement Pertinent to Current Situation/Hospitalization:  No - Comment as needed Significant Relationships:  Adult Children Lives with:  Self Do you feel safe going back to the place where you live?  No Need for family participation in patient care:  Yes (Comment)  Care giving concerns: Patient recently discharged from [facility] 2 days ago to her home in [place]. Patient lives alone and doesn't feel like she can take care of herself at home and requested to return to SNF.   Social Worker assessment / plan: [place] representative (CSW) met with patient alone at bedside to discuss D/C plan. Patient was alert and oriented X3 and was laying in the bed. CSW introduced self and explained role of CSW department. Patient reported that she wants to go back to rehab and her son Nicki Reaper will make the decision about which rehab. CSW contacted patient's son Nicki Reaper and made him aware of above. Scott reported that patient was at [facility] for about 5 and 1/2 weeks before she discharged home. CSW explained to [place] at [place] that patient has used around 40 SNF days and will be in her co-pay days. 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 Patient and her son Nicki Reaper are agreeable to SNF search  in [place]. FL2 complete and faxed out.   CSW presented bed offers to [place] and he chose [facility]. CSW explained to [place] that patient will require a 3 night qualifying inpatient stay at a hospital in order for medicare to pay for [facility]. Patient was admitted to inpatient on 05/01/17. [facility] admissions coordinator at [facility] is aware of above and accepted bed offer. CSW will continue to follow and assist as needed.   Employment status:  Retired Forensic scientist:  Medicare PT Recommendations:  Not assessed at this time Information / Referral to community resources:  [place]  Patient/Family's Response to care:  Patient's son Event organiser chose [facility].   Patient/Family's Understanding of and Emotional Response to Diagnosis, Current Treatment, and Prognosis:  Patient and her son were very pleasant and thanked CSW for assistance.   Emotional Assessment Appearance:  Appears stated age Attitude/Demeanor/Rapport:    Affect (typically observed):  Quiet Orientation:  Oriented to Self, Oriented to Place, Fluctuating Orientation (Suspected and/or reported Sundowners), Oriented to  Time Alcohol / Substance use:  Not Applicable Psych involvement (Current and /or in the community):  No (Comment)  Discharge Needs  Concerns to be addressed:  Discharge Planning Concerns Readmission within the last 30 days:  No Current discharge risk:  Dependent with Mobility, Chronically ill Barriers to Discharge:  Continued Medical Work up   [employer], Veronia Beets, LCSW 05/05/2017, 5:31 PM

## 2017-05-05 NOTE — Progress Notes (Signed)
SOUND Physicians - [place] at [facility]   PATIENT NAME: Rita Vang    MR#:  [id number]  DATE OF BIRTH:  November 14, 1924  SUBJECTIVE:  CHIEF COMPLAINT:   Chief Complaint  Patient presents with  . Hematemesis   Confused.  No bleeding On 1 L O2 Poor PO intake  REVIEW OF SYSTEMS:    Review of Systems  Unable to perform ROS: Dementia    DRUG ALLERGIES:   Allergies  Allergen Reactions  . Clarithromycin     Other reaction(s): Abdominal Pain  . Oxycontin [Oxycodone Hcl] Other (See Comments)    Hallucinations   . Sulfamethoxazole-Trimethoprim     Other reaction(s): Other (See Comments) Chest pain    VITALS:  Blood pressure (!) 164/79, pulse 63, temperature (!) 97.5 F (36.4 C), temperature source Oral, resp. rate 18, height 5\' 2"  (1.575 m), weight 67.6 kg (149 lb), SpO2 96 %.  PHYSICAL EXAMINATION:   Physical Exam  GENERAL:  84 y.o.-year-old patient lying in the bed with conversational dyspnea EYES: Pupils equal, round, reactive to light and accommodation. No scleral icterus. Extraocular muscles intact.  HEENT: Head atraumatic, normocephalic.  NECK:  Supple, no jugular venous distention. No thyroid enlargement, no tenderness.  LUNGS: bilateral wheezing and decreased air entry CARDIOVASCULAR: S1, S2 normal. No murmurs, rubs, or gallops.  ABDOMEN: Soft, nontender, nondistended. Bowel sounds present. No organomegaly or mass.  EXTREMITIES: No cyanosis, clubbing or edema b/l.    NEUROLOGIC: Moves all 4 extremities.   PSYCHIATRIC: The patient is pleasantly confused SKIN: No obvious rash, lesion, or ulcer.   LABORATORY PANEL:   CBC Recent Labs  Lab 05/04/17 0406 05/05/17 0332  WBC 17.2*  --   HGB 9.7* 8.3*  HCT 29.0*  --   PLT 116*  --    ------------------------------------------------------------------------------------------------------------------ Chemistries  Recent Labs  Lab 05/01/17 0225  05/03/17 0353 05/05/17 1219  NA 142   < > 147* 140   K 4.0   < > 3.0* 3.0*  CL 110   < > 118* 102  CO2 20*   < > 26 29  GLUCOSE 245*   < > 106* 124*  BUN 29*   < > 29* 21*  CREATININE 1.10*   < > 0.71 0.86  CALCIUM 8.3*   < > 7.8* 8.1*  MG  --    < > 1.8  --   AST 34  --   --   --   ALT 13*  --   --   --   ALKPHOS 95  --   --   --   BILITOT 0.5  --   --   --    < > = values in this interval not displayed.   ------------------------------------------------------------------------------------------------------------------  Cardiac Enzymes Recent Labs  Lab 05/01/17 0225  TROPONINI <0.03   ------------------------------------------------------------------------------------------------------------------  RADIOLOGY:  Dg Chest 2 View  Result Date: 05/04/2017 CLINICAL DATA:  Hypoxia today.  Possible aspiration pneumonia. EXAM: CHEST - 2 VIEW COMPARISON:  Single-view of the chest 05/01/2017 and 03/09/2017. FINDINGS: There is interstitial pulmonary edema. Moderate bilateral pleural effusions and basilar airspace disease are worse on the left. Heart size is upper normal. Extensive atherosclerotic vascular disease is present. Remote left rib fractures are noted. IMPRESSION: Interstitial pulmonary edema. Left worse than right effusions and airspace disease. Airspace disease could be due to atelectasis or pneumonia. The appearance is not typical of aspiration. Atherosclerosis. Electronically Signed   By: Drusilla Kanner M.D.   On: 05/04/2017 11:02  ASSESSMENT AND PLAN:   *COPD exacerbation with acute hypoxic respiratory failure On IV steroids and nebs  * Upper GI Bleed - no obvious source of bleeding-s/p hemostat clips for possible old lesion Protonix BID Monitor Hb Transfer out of ICU  * Acute blood loss anemia due to GI bleed Some worsening today. Monitor  *Ventilator dependent respiratory failure. Extubated and doing well.  * Acute blood loss anemia secondary to GI bleed.   Status post blood transfusion. On PPI  *Epistaxis.   Had a rhino pocket.  Removed by patient.  No further bleeding.  * Dementia  *DVT prophylaxis with SCDs  All the records are reviewed and case discussed with Care Management/Social Worker Management plans discussed with the patient, family and they are in agreement.  CODE STATUS: FULL CODE  DVT Prophylaxis: SCDs  TOTAL TIME TAKING CARE OF THIS PATIENT: 35 minutes.   POSSIBLE D/C IN 2-3 DAYS, DEPENDING ON CLINICAL CONDITION.  Molinda BailiffSrikar R Celestine Prim M.D on 05/05/2017 at 1:24 PM  Between 7am to 6pm - Pager - [phone]  After 6pm go to [email or web address] - password [email or web address] [facility]  S[facility] [place] [facility]  Office  [phone]  CC: Primary care physician; Mick SellFitzgerald, David P, MD  Note: This dictation was prepared with Dragon dictation along with smaller phrase technology. Any transcriptional errors that result from this process are unintentional.

## 2017-05-05 NOTE — Clinical Social Work Placement (Signed)
   CLINICAL SOCIAL WORK PLACEMENT  NOTE  Date:  05/05/2017  Patient Details  Name: Rita SnareFrances L Kwiatek MRN: [id number] Date of Birth: 11/07/1924  Clinical Social Work is seeking post-discharge placement for this patient at the Skilled  Nursing Facility level of care (*CSW will initial, date and re-position this form in  chart as items are completed):  Yes   Patient/family provided with [facility] Clinical Social Work Department's list of facilities offering this level of care within the geographic area requested by the patient (or if unable, by the patient's family).  Yes   Patient/family informed of their freedom to choose among providers that offer the needed level of care, that participate in Medicare, Medicaid or managed care program needed by the patient, have an available bed and are willing to accept the patient.  Yes   Patient/family informed of [facility]'s ownership interest [facility] and [facility], as well as of the fact that they are under no obligation to receive care at these facilities.  PASRR submitted to EDS on       PASRR number received on       Existing PASRR number confirmed on 05/05/17     FL2 transmitted to all facilities in geographic area requested by pt/family on 05/05/17     FL2 transmitted to all facilities within larger geographic area on       Patient informed that his/her managed care company has contracts with or will negotiate with certain facilities, including the following:        Yes   Patient/family informed of bed offers received.  Patient [facility]     Physician recommends and patient chooses bed at      Patient to be transferred to   on  .  Patient to be transferred to facility by       Patient family notified on   of transfer.  Name of family member notified:        PHYSICIAN       Additional Comment:    _______________________________________________ Zyden Suman, Darleen CrockerBailey M, LCSW 05/05/2017,  5:30 PM

## 2017-05-05 NOTE — Consult Note (Signed)
Pharmacy Electrolyte Monitoring Consult:  Pharmacy consulted to assist in monitoring and replacing electrolytes in this 84 y.o. female admitted on 05/01/2017 with hematemesis  Labs:  Sodium (mmol/L)  Date Value  05/05/2017 140  11/19/2012 135 (L)   Potassium (mmol/L)  Date Value  05/05/2017 3.0 (L)  09/13/2013 4.6   Magnesium (mg/dL)  Date Value  14/78/295604/22/2019 1.5 (L)   Phosphorus (mg/dL)  Date Value  21/30/865704/22/2019 2.3 (L)   Calcium (mg/dL)  Date Value  84/69/629504/22/2019 8.1 (L)   Calcium, Total (mg/dL)  Date Value  28/41/324411/06/2012 9.2   Albumin (g/dL)  Date Value  01/02/725304/18/2019 2.6 (L)  11/19/2012 3.9     Plan: Potassium level was replaced earlier in the day (4/22). Magnesium and Phosphorous levels will be replaced.  Will order Magnesium Sulfate 2 grams Iv x1 and K-Phos Neutral 2 tabs q4h x 2 per protocol. Will check labs in AM.    Pharmacy will continue to monitor and adjust per consult.

## 2017-05-05 NOTE — Care Management Important Message (Signed)
Important Message  Patient Details  Name: Rita Vang MRN: [id number] Date of Birth: 08/27/1924   Medicare Important Message Given:  Yes    Olegario MessierKathy A Carlin Mamone 05/05/2017, 12:06 PM

## 2017-05-05 NOTE — [place] FL2 (Signed)
[place] MEDICAID FL2 LEVEL OF CARE SCREENING TOOL     IDENTIFICATION  Patient Name: Rita Vang Birthdate: [birth year] Sex: female Admission Date (Current Location): 05/01/2017  [place] Number:  Chiropodist and Address:  [facility], [address], [place], [place] [address]      Provider Number: [id number]  Attending Physician Name and Address:  Milagros Loll, MD  Relative Name and Phone Number:       Current Level of Care: Hospital Recommended Level of Care: Skilled Nursing Facility Prior Approval Number:    Date Approved/Denied:   PASRR Number: [id number])  Discharge Plan: SNF    Current Diagnoses: Patient Active Problem List   Diagnosis Date Noted  . GI bleed 05/01/2017  . Acute gastrointestinal hemorrhage   . Gastric irritation   . Herpes genitalis 04/04/2017  . Protein-calorie malnutrition (HCC) 04/04/2017  . Hypomagnesemia 04/04/2017  . Vitamin B12 deficiency 04/04/2017  . Generalized weakness 04/01/2017  . Pressure injury of skin 03/07/2017  . Sepsis (HCC) 03/06/2017  . CAP (community acquired pneumonia) 03/06/2017  . HTN (hypertension) 03/06/2017  . Anxiety 03/06/2017  . HLD (hyperlipidemia) 03/06/2017  . GERD (gastroesophageal reflux disease) 03/06/2017  . CKD (chronic kidney disease), stage III (HCC) 03/06/2017  . CVA (cerebral infarction) 08/23/2014    Orientation RESPIRATION BLADDER Height & Weight     Self, Time  O2(2 Liters Oxygen. ) Incontinent Weight: 149 lb (67.6 kg) Height:  5\' 2"  (157.5 cm)  BEHAVIORAL SYMPTOMS/MOOD NEUROLOGICAL BOWEL NUTRITION STATUS      Continent Diet(Dysphagia 1 (puree);Thin liquid)  AMBULATORY STATUS COMMUNICATION OF NEEDS Skin   Extensive Assist Verbally Normal                       Personal Care Assistance Level of Assistance  Bathing, Feeding, Dressing Bathing Assistance: Limited assistance Feeding assistance: Independent Dressing Assistance:  Limited assistance     Functional Limitations Info  Sight, Hearing, Speech Sight Info: Adequate Hearing Info: Adequate Speech Info: Adequate    SPECIAL CARE FACTORS FREQUENCY  PT (By licensed PT), OT (By licensed OT)     PT Frequency: (5) OT Frequency: (5)            Contractures      Additional Factors Info  Code Status, Allergies Code Status Info: (Full Code. ) Allergies Info: (Clarithromycin, Oxycontin Oxycodone Hcl, Sulfamethoxazole-trimethoprim)           Current Medications (05/05/2017):  This is the current hospital active medication list Current Facility-Administered Medications  Medication Dose Route Frequency Provider Last Rate Last Dose  . acetaminophen (TYLENOL) tablet 650 mg  650 mg Oral Q6H PRN Lewie Loron, NP       Or  . acetaminophen (TYLENOL) suppository 650 mg  650 mg Rectal Q6H PRN Lewie Loron, NP   650 mg at 05/01/17 2341  . Ampicillin-Sulbactam (UNASYN) 3 g in sodium chloride 0.9 % 100 mL IVPB  3 g Intravenous Q6H Lewie Loron, NP   Stopped at 05/05/17 0544  . chlorhexidine (PERIDEX) 0.12 % solution 15 mL  15 mL Mouth Rinse BID Marylou Flesher S, NP   15 mL at 05/05/17 0919  . citalopram (CELEXA) tablet 20 mg  20 mg Oral Daily Milagros Loll, MD   20 mg at 05/05/17 0919  . docusate sodium (COLACE) capsule 100 mg  100 mg Oral BID Marylou Flesher S, NP   100 mg at 05/05/17 0919  . donepezil (  ARICEPT) tablet 10 mg  10 mg Oral QHS [name], Srikar, MD   10 mg at 05/04/17 2244  . feeding supplement (ENSURE ENLIVE) (ENSURE ENLIVE) liquid 237 mL  237 mL Oral BID BM Tukov, Magadalene S, NP   237 mL at 05/05/17 0922  . fluticasone (FLONASE) 50 MCG/ACT nasal spray 1 spray  1 spray Each Nare Daily [name], Srikar, MD   1 spray at 05/05/17 0918  . furosemide (LASIX) injection 40 mg  40 mg Intravenous BID [name], Srikar, MD   40 mg at 05/05/17 0917  . galantamine (RAZADYNE) tablet 4 mg  4 mg Oral BID WC [name], Srikar, MD   4 mg at 05/05/17  [id number]  . glycopyrrolate (ROBINUL) tablet 1 mg  1 mg Oral Daily [name], Srikar, MD   1 mg at 05/05/17 0918  . ipratropium-albuterol (DUONEB) 0.5-2.5 (3) MG/3ML nebulizer solution 3 mL  3 mL Nebulization TID [name], Srikar, MD   3 mL at 05/05/17 0852  . ipratropium-albuterol (DUONEB) 0.5-2.5 (3) MG/3ML nebulizer solution 3 mL  3 mL Nebulization Q6H PRN Sudini, Srikar, MD      . magnesium oxide (MAG-OX) tablet 200 mg  200 mg Oral Daily [name], Srikar, MD   200 mg at 05/05/17 0920  . MEDLINE mouth rinse  15 mL Mouth Rinse q12n4p Tukov, Magadalene S, NP   15 mL at 05/03/17 1800  . methylPREDNISolone sodium succinate (SOLU-MEDROL) 125 mg/2 mL injection 60 mg  60 mg Intravenous Daily [name], Srikar, MD   60 mg at 05/05/17 0917  . metoprolol tartrate (LOPRESSOR) tablet 25 mg  25 mg Oral BID Marylou Flesherukov, Magadalene S, NP   25 mg at 05/05/17 0920  . mometasone-formoterol (DULERA) 100-5 MCG/ACT inhaler 2 puff  2 puff Inhalation BID [name], Srikar, MD   2 puff at 05/05/17 [id number]  . mupirocin ointment (BACTROBAN) 2 %   Nasal BID Tukov, Magadalene S, NP      . ondansetron (ZOFRAN) tablet 4 mg  4 mg Oral Q6H PRN Tukov, Magadalene S, NP       Or  . ondansetron (ZOFRAN) injection 4 mg  4 mg Intravenous Q6H PRN Tukov, Magadalene S, NP      . pantoprazole (PROTONIX) EC tablet 40 mg  40 mg Oral BID AC Toney ReilVanga, Rohini Reddy, MD   40 mg at 05/05/17 0923  . potassium chloride SA (K-DUR,KLOR-CON) CR tablet 20 mEq  20 mEq Oral Daily [name], Srikar, MD   20 mEq at 05/05/17 0919  . QUEtiapine (SEROQUEL) tablet 25 mg  25 mg Oral QHS [name], Srikar, MD   25 mg at 05/04/17 2243  . simvastatin (ZOCOR) tablet 20 mg  20 mg Oral Daily [name], Srikar, MD   20 mg at 05/05/17 0924  . sodium chloride (OCEAN) 0.65 % nasal spray 1 spray  1 spray Each Nare PRN Marylou Flesherukov, Magadalene S, NP      . traMADol (ULTRAM) tablet 50 mg  50 mg Oral BID [name], Srikar, MD   50 mg at 05/05/17 0919     Discharge Medications: Please see discharge summary for a  list of discharge medications.  Relevant Imaging Results:  Relevant Lab Results:   Additional Information (SSN: [id number])  Parneet Glantz, Darleen CrockerBailey M, LCSW

## 2017-05-05 NOTE — Progress Notes (Signed)
Pharmacy Antibiotic Note  Rita SnareFrances Vang Vang is a 84 y.o. female admitted on 05/01/2017 with aspiration pneumonia.  Patient now extubated. Pharmacy has been consulted for Unasyn dosing.  Plan: Continue Unasyn 3g IV Q6hr.    Height: 5\' 2"  (157.5 cm) Weight: 149 lb (67.6 kg) IBW/kg (Calculated) : 50.1  Temp (24hrs), Avg:98 F (36.7 C), Min:97.5 F (36.4 C), Max:98.2 F (36.8 C)  Recent Labs  Lab 05/01/17 0225 05/01/17 0613 05/01/17 1017 05/01/17 1020 05/01/17 2225 05/02/17 0427 05/03/17 0353 05/04/17 0406  WBC 24.2*  --   --  22.9*  --  21.8* 17.5* 17.2*  CREATININE 1.10*  --  0.93  --  1.00  --  0.71  --   LATICACIDVEN 7.6* 2.1*  --   --   --  1.1  --   --     Estimated Creatinine Clearance: 42.1 mL/min (by C-G formula based on SCr of 0.71 mg/dL).    Allergies  Allergen Reactions  . Clarithromycin     Other reaction(s): Abdominal Pain  . Oxycontin [Oxycodone Hcl] Other (See Comments)    Hallucinations   . Sulfamethoxazole-Trimethoprim     Other reaction(s): Other (See Comments) Chest pain    Antimicrobials this admission: Unasyn 4/18  >>     Dose adjustments this admission: 4/18 Unasyn transitioned to 3g IV Q6Hr  Microbiology results: 4/18 MRSA PCR: negative   Thank you for allowing pharmacy to be a part of this patient's care.  Rita HeckWang, Rita Vang 05/05/2017 12:01 PM

## 2017-05-05 NOTE — Progress Notes (Signed)
Paged dr. Elpidio AnisSudini to request for PT order for eval/safety. Awaiting response

## 2017-05-06 DIAGNOSIS — Z515 Encounter for palliative care: Secondary | ICD-10-CM

## 2017-05-06 DIAGNOSIS — F0280 Dementia in other diseases classified elsewhere without behavioral disturbance: Secondary | ICD-10-CM

## 2017-05-06 DIAGNOSIS — G3183 Dementia with Lewy bodies: Secondary | ICD-10-CM

## 2017-05-06 DIAGNOSIS — Z7189 Other specified counseling: Secondary | ICD-10-CM

## 2017-05-06 LAB — BASIC METABOLIC PANEL
Anion gap: 9 (ref 5–15)
BUN: 28 mg/dL — AB (ref 6–20)
CALCIUM: 7.9 mg/dL — AB (ref 8.9–10.3)
CO2: 33 mmol/L — ABNORMAL HIGH (ref 22–32)
CREATININE: 0.81 mg/dL (ref 0.44–1.00)
Chloride: 99 mmol/L — ABNORMAL LOW (ref 101–111)
GFR calc Af Amer: >60 mL/min (ref >60)
GLUCOSE: 94 mg/dL (ref 65–99)
POTASSIUM: 3.6 mmol/L (ref 3.5–5.1)
SODIUM: 141 mmol/L (ref 135–145)

## 2017-05-06 LAB — MAGNESIUM: Magnesium: 1.8 mg/dL (ref 1.7–2.4)

## 2017-05-06 LAB — HEMOGLOBIN: HEMOGLOBIN: 9.3 g/dL — AB (ref 12.0–16.0)

## 2017-05-06 LAB — PHOSPHORUS: Phosphorus: 3.4 mg/dL (ref 2.5–4.6)

## 2017-05-06 MED ORDER — POTASSIUM CHLORIDE CRYS ER 20 MEQ PO TBCR
40.0000 meq | EXTENDED_RELEASE_TABLET | Freq: Every day | ORAL | Status: DC
  Administered 2017-05-06 – 2017-05-07 (×2): 40 meq via ORAL
  Filled 2017-05-06 (×2): qty 2

## 2017-05-06 MED ORDER — MAGNESIUM OXIDE 400 (241.3 MG) MG PO TABS
400.0000 mg | ORAL_TABLET | Freq: Every day | ORAL | Status: DC
  Administered 2017-05-07: 400 mg via ORAL
  Filled 2017-05-06: qty 1

## 2017-05-06 MED ORDER — MAGNESIUM OXIDE 400 (241.3 MG) MG PO TABS
400.0000 mg | ORAL_TABLET | Freq: Two times a day (BID) | ORAL | Status: AC
  Administered 2017-05-06 (×2): 400 mg via ORAL
  Filled 2017-05-06 (×2): qty 1

## 2017-05-06 NOTE — Consult Note (Signed)
Pharmacy Electrolyte Monitoring Consult:  Pharmacy consulted to assist in monitoring and replacing electrolytes in this 84 y.o. female admitted on 05/01/2017 with hematemesis-Afib  Labs:  Sodium (mmol/L)  Date Value  05/06/2017 141  11/19/2012 135 (L)   Potassium (mmol/L)  Date Value  05/06/2017 3.6  09/13/2013 4.6   Magnesium (mg/dL)  Date Value  96/04/540904/23/2019 1.8   Phosphorus (mg/dL)  Date Value  81/19/147804/23/2019 3.4   Calcium (mg/dL)  Date Value  29/56/213004/23/2019 7.9 (L)   Calcium, Total (mg/dL)  Date Value  86/57/846911/06/2012 9.2   Albumin (g/dL)  Date Value  62/95/284104/18/2019 2.6 (L)  11/19/2012 3.9     Plan: Due to Afib will target a goal of K >4 and Mg >2. I will increase home dose of K to 40 MEQ daily and Mag ox 400mg  daily. I will give mag ox 400mg  BID x 2 doses today.    Pharmacy will continue to monitor and adjust per consult.    Olene FlossMelissa D Madisson Kulaga, Pharm.D, BCPS Clinical Pharmacist

## 2017-05-06 NOTE — Progress Notes (Addendum)
Per MD patient is not stable for D/C today. Plan is for patient to D[facility]C to [facility] when medically stable. Patient's son [name] is aware of above. [facility] is aware of above.   Baker Spielberg IncorporatedBailey Shaasia Odle, LCSW [phone]

## 2017-05-06 NOTE — Progress Notes (Signed)
Pt seen in room, in bed with son at beside. Pt pleasant and cooperative. Unable to state date, place or situation. Follows simple commands. Denies pain.call bell in reach.

## 2017-05-06 NOTE — Progress Notes (Signed)
[facility] [place] [facility]   PATIENT NAME: Cristela BlueFrances Hiscox    MR#:  [id number]  DATE OF BIRTH:  10/02/1924  SUBJECTIVE:  CHIEF COMPLAINT:   Chief Complaint  Patient presents with  . Hematemesis    Confused.  Sitting in a chair.  Worked with physical therapy.  On oxygen.  Audible wheezing  REVIEW OF SYSTEMS:    Review of Systems  Unable to perform ROS: Dementia    DRUG ALLERGIES:   Allergies  Allergen Reactions  . Clarithromycin     Other reaction(s): Abdominal Pain  . Oxycontin [Oxycodone Hcl] Other (See Comments)    Hallucinations   . Sulfamethoxazole-Trimethoprim     Other reaction(s): Other (See Comments) Chest pain    VITALS:  Blood pressure (!) 147/104, pulse 87, temperature 98.3 F (36.8 C), temperature source Oral, resp. rate 18, height 5\' 2"  (1.575 m), weight 60.3 kg (133 lb), SpO2 97 %.  PHYSICAL EXAMINATION:   Physical Exam  GENERAL:  84 y.o.-year-old patient lying in the bed , dypneic EYES: Pupils equal, round, reactive to light and accommodation. No scleral icterus. Extraocular muscles intact.  HEENT: Head atraumatic, normocephalic.  NECK:  Supple, no jugular venous distention. No thyroid enlargement, no tenderness.  LUNGS: bilateral wheezing and decreased air entry CARDIOVASCULAR: S1, S2 normal. No murmurs, rubs, or gallops.  ABDOMEN: Soft, nontender, nondistended. Bowel sounds present. No organomegaly or mass.  EXTREMITIES: No cyanosis, clubbing or edema b/l.    NEUROLOGIC: Moves all 4 extremities.   PSYCHIATRIC: The patient is pleasantly confused SKIN: No obvious rash, lesion, or ulcer.   LABORATORY PANEL:   CBC Recent Labs  Lab 05/04/17 0406  05/06/17 0426  WBC 17.2*  --   --   HGB 9.7*   < > 9.3*  HCT 29.0*  --   --   PLT 116*  --   --    < > = values in this interval not displayed.    ------------------------------------------------------------------------------------------------------------------ Chemistries  Recent Labs  Lab 05/01/17 0225  05/06/17 0426  NA 142   < > 141  K 4.0   < > 3.6  CL 110   < > 99*  CO2 20*   < > 33*  GLUCOSE 245*   < > 94  BUN 29*   < > 28*  CREATININE 1.10*   < > 0.81  CALCIUM 8.3*   < > 7.9*  MG  --    < > 1.8  AST 34  --   --   ALT 13*  --   --   ALKPHOS 95  --   --   BILITOT 0.5  --   --    < > = values in this interval not displayed.   ------------------------------------------------------------------------------------------------------------------  Cardiac Enzymes Recent Labs  Lab 05/01/17 0225  TROPONINI <0.03   ------------------------------------------------------------------------------------------------------------------  RADIOLOGY:  No results found. ASSESSMENT AND PLAN:   *COPD exacerbation with acute hypoxic respiratory failure On IV steroids and nebs Continues to need 2 L oxygen and has acute wheezing.  Will continue treatment inpatient for 1 more day.  * Upper GI Bleed - no obvious source of bleeding-s/p hemostat clips for possible old lesion Protonix BID Monitor Hb.  Stable  * Acute blood loss anemia due to GI bleed Stable  *Ventilator dependent respiratory failure. Extubated  * Acute blood loss anemia secondary to GI bleed.   Status post blood transfusion. On PPI  *Epistaxis.  Had a rhino pocket.  Removed by  patient.  No further bleeding.  * Dementia  *DVT prophylaxis with SCDs  Requested palliative care consultation for goals of care conversation.  All the records are reviewed and case discussed with Care Management/Social Worker Management plans discussed with the patient, family and they are in agreement.  CODE STATUS: FULL CODE  DVT Prophylaxis: SCDs  TOTAL TIME TAKING CARE OF THIS PATIENT: 35 minutes.   POSSIBLE D/C IN 2-3 DAYS, DEPENDING ON CLINICAL CONDITION.  [name]  Yvaine Jankowiak M.D on 05/06/2017 at 1:15 PM  Between 7am to 6pm - Pager - [phone]  After 6pm go to [email or web address] - password [email or web address] [facility]  [facility]  Office  [phone]  CC: Primary care physician; Mick Sell, MD  Note: This dictation was prepared with Dragon dictation along with smaller phrase technology. Any transcriptional errors that result from this process are unintentional.

## 2017-05-06 NOTE — Consult Note (Signed)
Consultation Note Date: 05/06/2017   Patient Name: Rita Vang  DOB: 02-Sep-1924  MRN: [id number]  Age / Sex: 84 y.o., female  PCP: Mick Sell, MD Referring Physician: Milagros Loll, MD  Reason for Consultation: Establishing goals of care and Psychosocial/spiritual support  HPI/Patient Profile: 84 y.o. female   admitted on 05/01/2017 with a past medical history of recent hospital admission for sepsis and pneumonia with elevated troponin as well as A. fib, hypertension, history of CVA and GERD, dementia,  presents to the emergency department after an episode of hematemesis.    The patient vomited blood that covered her chest and hair and face.  She became hypotensive and was not protecting her airway which prompted the emergency department staff to intubate her.  Transfusion of packed red blood cells was ordered prior to the emergency department staff calling the hospitalist service for admission.  Admitted for stabilization and treatment   Patient and family face treatemtn option decision, advanced directive decisions and anticipatory care needs   Clinical Assessment and Goals of Care:  This NP Lorinda Creed reviewed medical records, received report from team, assessed the patient and then meet at the patient's bedside along with one of her caregivers and spoke by phone with son/HPOA Rita Vang to discuss diagnosis, prognosis, GOC, EOL wishes disposition and options  Concept of Hospice and Palliative Care were discussed  A detailed discussion was had today regarding advanced directives.  Concepts specific to code status, artifical feeding and hydration, continued IV antibiotics and rehospitalization was had.  The difference between a aggressive medical intervention path  and a palliative comfort care path for this patient at this time was had.  Values and goals of care important to patient and  family were attempted to be elicited.  MOST form discussed, and the importance of documenting advanced directives before an acute situation.   Discussed the patient's high risk for decompensation.  Son had many detailed questions regarding current documented advanced directive as it relates to allowing a natural death.  I attempted to answer questions and concerns to the best of my ability.  Natural trajectory and expectations at EOL were discussed.  Questions and concerns addressed.   Family encouraged to call with questions or concerns.      HCPOA/son    SUMMARY OF RECOMMENDATIONS    Code Status/Advance Care Planning:  Full code    Palliative Prophylaxis:   Aspiration, Bowel Regimen, Delirium Protocol and Oral Care  Additional Recommendations (Limitations, Scope, Preferences):  Full Scope Treatment  Psycho-social/Spiritual:   Desire for further Chaplaincy support:no  Prognosis:   Unable to determine  Discharge Planning: Skilled Nursing Facility for rehab with Palliative care service follow-up     Primary Diagnoses: Present on Admission: . GI bleed   I have reviewed the medical record, interviewed the patient and family, and examined the patient. The following aspects are pertinent.  Past Medical History:  Diagnosis Date  . Anxiety   . Anxiety disorder    unspecified  . Cerebral infarction (HCC)   .  CVA (cerebral vascular accident) (HCC)   . Dementia   . Depression   . GERD (gastroesophageal reflux disease)   . History of recurrent UTIs   . HLD (hyperlipidemia)   . HTN (hypertension)   . Vertigo    syncope   Social History   Socioeconomic History  . Marital status: Widowed    Spouse name: Not on file  . Number of children: 4  . Years of education: 6614  . Highest education level: Associate degree: occupational, Scientist, product/process developmenttechnical, or vocational program  Occupational History  . Not on file  Social Needs  . Financial resource strain: Not on file  . Food  insecurity:    Worry: Not on file    Inability: Not on file  . Transportation needs:    Medical: Not on file    Non-medical: Not on file  Tobacco Use  . Smoking status: Former Smoker    Packs/day: 1.00    Types: Cigarettes  . Smokeless tobacco: Never Used  . Tobacco comment: quit at age 84  Substance and Sexual Activity  . Alcohol use: No  . Drug use: Not on file  . Sexual activity: Not on file  Lifestyle  . Physical activity:    Days per week: Not on file    Minutes per session: Not on file  . Stress: Not on file  Relationships  . Social connections:    Talks on phone: Not on file    Gets together: Not on file    Attends religious service: Not on file    Active member of club or organization: Not on file    Attends meetings of clubs or organizations: Not on file    Relationship status: Not on file  Other Topics Concern  . Not on file  Social History Narrative  . Not on file   Family History  Problem Relation Age of Onset  . Heart attack Father   . Alzheimer's disease Mother    Scheduled Meds: . amoxicillin-clavulanate  800 mg Oral Q12H  . chlorhexidine  15 mL Mouth Rinse BID  . citalopram  20 mg Oral Daily  . docusate sodium  100 mg Oral BID  . donepezil  10 mg Oral QHS  . feeding supplement (ENSURE ENLIVE)  237 mL Oral BID BM  . fluticasone  1 spray Each Nare Daily  . galantamine  4 mg Oral BID WC  . glycopyrrolate  1 mg Oral Daily  . ipratropium-albuterol  3 mL Nebulization TID  . [START ON 05/07/2017] magnesium oxide  400 mg Oral Daily  . magnesium oxide  400 mg Oral BID  . mouth rinse  15 mL Mouth Rinse q12n4p  . metoprolol tartrate  25 mg Oral BID  . mometasone-formoterol  2 puff Inhalation BID  . mupirocin ointment   Nasal BID  . pantoprazole  40 mg Oral BID AC  . potassium chloride SA  40 mEq Oral Daily  . QUEtiapine  25 mg Oral QHS  . simvastatin  20 mg Oral Daily   Continuous Infusions: PRN Meds:.acetaminophen **OR** acetaminophen,  ipratropium-albuterol, ondansetron **OR** ondansetron (ZOFRAN) IV, sodium chloride, traMADol Medications Prior to Admission:  Prior to Admission medications   Medication Sig Start Date End Date Taking? Authorizing Provider  albuterol (PROVENTIL HFA;VENTOLIN HFA) 108 (90 Base) MCG/ACT inhaler Inhale 2 puffs into the lungs every 6 (six) hours as needed for wheezing or shortness of breath.   Yes [provider]  alum & mag hydroxide-simeth (MAALOX/MYLANTA) 200-200-20 MG/5ML suspension  Take 30 mLs by mouth every 6 (six) hours as needed for indigestion or heartburn. 03/12/17  Yes Gouru, Deanna Artis, MD  aspirin 325 MG tablet Take 1 tablet (325 mg total) by mouth daily. 08/24/14  Yes Gale Journey, MD  cholecalciferol (VITAMIN D) 1000 UNITS tablet Take 1,000 Units by mouth daily.   Yes [provider]  citalopram (CELEXA) 20 MG tablet Take 1 tablet by mouth daily.  12/30/13  Yes [provider]  donepezil (ARICEPT) 10 MG tablet Take 1 tablet by mouth at bedtime.  02/02/14  Yes [provider]  fluticasone (FLONASE) 50 MCG/ACT nasal spray Place 1 spray into both nostrils daily.  02/24/14  Yes [provider]  Fluticasone-Salmeterol (ADVAIR) 100-50 MCG/DOSE AEPB Inhale 1 puff into the lungs 2 (two) times daily.    Yes [provider]  galantamine (RAZADYNE) 4 MG tablet Take 4 mg by mouth 2 (two) times daily with a meal.    Yes [provider]  glycopyrrolate (ROBINUL) 1 MG tablet Take 1 mg by mouth daily.    Yes [provider]  ipratropium (ATROVENT HFA) 17 MCG/ACT inhaler Inhale 2 puffs into the lungs 3 (three) times daily.   Yes [provider]  ipratropium (ATROVENT) 0.02 % nebulizer solution Take 2.5 mLs (0.5 mg total) by nebulization 3 (three) times daily. Patient taking differently: Take 0.5 mg by nebulization every 8 (eight) hours as needed for wheezing or shortness of breath.  03/12/17  Yes Gouru, Deanna Artis, MD  Magnesium Oxide  250 MG TABS Take 1 tablet by mouth daily.   Yes [provider]  MINERAL OIL PO Take 5 mLs by mouth at bedtime.   Yes [provider]  Multiple Vitamins-Minerals (I-VITE PROTECT PO) Take 1 tablet by mouth 2 (two) times daily.    Yes [provider]  polyethylene glycol (MIRALAX) packet Take 17 g by mouth daily as needed for mild constipation.    Yes [provider]  potassium chloride SA (K-DUR,KLOR-CON) 20 MEQ tablet Take 20 mEq by mouth daily.   Yes [provider]  QUEtiapine (SEROQUEL) 25 MG tablet Take 25 mg by mouth at bedtime.   Yes [provider]  sennosides-docusate sodium (SENOKOT-S) 8.6-50 MG tablet Take 1 tablet by mouth every other day.    Yes [provider]  simvastatin (ZOCOR) 20 MG tablet Take 1 tablet by mouth daily. 04/22/17  Yes [provider]  sodium chloride (OCEAN) 0.65 % nasal spray Place 2 sprays into the nose as needed for congestion. Into each nostril for congestion, dry sinuses, irritation. May keep at bedside.    Yes [provider]  traMADol (ULTRAM) 50 MG tablet Take 50 mg by mouth 2 (two) times daily.    Yes [provider]  valACYclovir (VALTREX) 500 MG tablet Take 500 mg by mouth 2 (two) times daily.    Yes [provider]  furosemide (LASIX) 20 MG tablet Take 1 tablet (20 mg total) by mouth daily. Patient not taking: Reported on 05/03/2017 03/13/17   Ramonita Lab, MD  Glucosamine 500 MG CAPS Take 500 mg by mouth daily.    [provider]  guaiFENesin-dextromethorphan (ROBITUSSIN DM) 100-10 MG/5ML syrup Take 5 mLs by mouth every 4 (four) hours as needed for cough. Patient not taking: Reported on 05/03/2017 03/12/17   Ramonita Lab, MD  levalbuterol (XOPENEX) 1.25 MG/0.5ML nebulizer solution Take 1.25 mg by nebulization every 4 (four) hours as needed for wheezing or shortness of breath. Patient not  taking: Reported on 05/03/2017 03/12/17   Ramonita Lab, MD    levalbuterol (XOPENEX) 1.25 MG/0.5ML nebulizer solution Take 1.25 mg by nebulization 3 (three) times daily. Patient not taking: Reported on 05/03/2017 03/12/17   Ramonita Lab, MD   Allergies  Allergen Reactions  . Clarithromycin     Other reaction(s): Abdominal Pain  . Oxycontin [Oxycodone Hcl] Other (See Comments)    Hallucinations   . Sulfamethoxazole-Trimethoprim     Other reaction(s): Other (See Comments) Chest pain   Review of Systems  All other systems reviewed and are negative.   Physical Exam  Constitutional: She appears well-developed.  Cardiovascular: Normal rate, regular rhythm and normal heart sounds.  Pulmonary/Chest: She has wheezes.  Neurological: She is alert.  Oriented only to self, no awareness to her current situation    Vital Signs: BP (!) 147/104 (BP Location: Left Arm)   Pulse 87   Temp 98.3 F (36.8 C) (Oral)   Resp 18   Ht 5\' 2"  (1.575 m)   Wt 60.3 kg (133 lb)   SpO2 97%   BMI 24.33 kg/m  Pain Scale: 0-10 POSS *See Group Information*: 1-Acceptable,Awake and alert Pain Score: 0-No pain   SpO2: SpO2: 97 % O2 Device:SpO2: 97 % O2 Flow Rate: .O2 Flow Rate (L/min): 2 L/min  IO: Intake/output summary:   Intake/Output Summary (Last 24 hours) at 05/06/2017 1307 Last data filed at 05/06/2017 0900 Gross per 24 hour  Intake 1300 ml  Output 3600 ml  Net -2300 ml    LBM: Last BM Date: 05/03/17 Baseline Weight: Weight: 59 kg (130 lb) Most recent weight: Weight: 60.3 kg (133 lb)     Palliative Assessment/Data: 30 %    Discussed with Dr Elpidio Anis  Time In: 1330 Time Out: 1430 Time Total: 60 minutes Greater than 50%  of this time was spent counseling and coordinating care related to the above assessment and plan.  Signed by: Lorinda Creed, NP   Please contact Palliative Medicine Team phone at [phone] for questions and concerns.  For individual provider: See Loretha Stapler

## 2017-05-06 NOTE — Evaluation (Signed)
Physical Therapy Evaluation Patient Details Name: Rita SnareFrances L Stuck MRN: [id number] DOB: 10/10/1924 Today's Date: 05/06/2017   History of Present Illness  Pt is a 84 yo F with past medical history of recent hospital admission for sepsis and pneumonia with elevated troponin as well as A. fib, hypertension, history of CVA and GERD presents to the emergency department after an episode of hematemesis. She became hypotensive and was not protecting her airway which prompted the emergency department staff to intubate her. Transfusion of packed red blood cells was ordered prior to the emergency department staff calling the hospitalist service for admission.  Assessment includes:  COPD exacerbation, upper GI bleed, acute blood loss anemia, ventilator dependent respiratory failure now extubated, epistaxis, and dementia.      Clinical Impression  Pt presents with deficits in strength, transfers, mobility, gait, balance, and activity tolerance.  Pt required encouragement to participate during session and was somewhat confused and oriented to self only.  Pt was able to follow commands during the session, however, but did require extensive cues at times.  Pt required assistance per below with bed mobility and transfer tasks and was generally unsteady with gait requiring near constant physical assistance to prevent LOB.  Pt will benefit from PT services in a SNF setting upon discharge to safely address above deficits for decreased caregiver assistance and eventual return to PLOF.      Follow Up Recommendations SNF;Supervision for mobility/OOB    Equipment Recommendations  None recommended by PT    Recommendations for Other Services       Precautions / Restrictions Precautions Precautions: Fall Restrictions Weight Bearing Restrictions: No      Mobility  Bed Mobility Overal bed mobility: Needs Assistance Bed Mobility: Sit to Supine;Supine to Sit     Supine to sit: Min assist Sit to supine: Min  assist   General bed mobility comments: Min A and max verbal cues for sequencing  Transfers Overall transfer level: Needs assistance Equipment used: Rolling walker (2 wheeled) Transfers: Sit to/from Stand Sit to Stand: Min assist;Mod assist         General transfer comment: Min-mod A to stand from elevated EOB with mod verbal cues for sequencing  Ambulation/Gait Ambulation/Gait assistance: Mod assist Ambulation Distance (Feet): 4 Feet Assistive device: Rolling walker (2 wheeled) Gait Pattern/deviations: Step-through pattern;Decreased step length - right;Decreased step length - left     General Gait Details: Pt required mod A while ambulating to prevent LOB and fatigued quickly with SpO2 96% and HR 102 bpm after amb up from 88 bpm at rest.   Stairs            Wheelchair Mobility    Modified Rankin (Stroke Patients Only)       Balance Overall balance assessment: Needs assistance Sitting-balance support: Feet supported;Bilateral upper extremity supported Sitting balance-Leahy Scale: Fair     Standing balance support: Bilateral upper extremity supported Standing balance-Leahy Scale: Poor                               Pertinent Vitals/Pain Pain Assessment: No/denies pain    Home Living Family/patient expects to be discharged to:: Private residence Living Arrangements: Alone Available Help at Discharge: Family;Personal care attendant Type of Home: House Home Access: Ramped entrance     Home Layout: One level Home Equipment: Wheelchair - manual;Hand held shower head;Toilet riser;Shower seat;Bedside commode;Walker - 2 wheels;Walker - 4 wheels      Prior Function  Level of Independence: Needs assistance   Gait / Transfers Assistance Needed: Per pt uses a 4WW for amb with Mod Ind, no fall history  ADL's / Homemaking Assistance Needed: Sitters or son come in to assist pt with bathing and dressing per previous admission        Hand Dominance         Extremity/Trunk Assessment   Upper Extremity Assessment Upper Extremity Assessment: Generalized weakness    Lower Extremity Assessment Lower Extremity Assessment: Generalized weakness       Communication   Communication: No difficulties  Cognition Arousal/Alertness: Awake/alert Behavior During Therapy: WFL for tasks assessed/performed Overall Cognitive Status: No family/caregiver present to determine baseline cognitive functioning                                 General Comments: Pt A&O x 1 only, could not state her location, year, or symptoms prior to admission      General Comments      Exercises Total Joint Exercises Ankle Circles/Pumps: Strengthening;Both;10 reps Quad Sets: Strengthening;Both;10 reps Gluteal Sets: Strengthening;Both;10 reps Heel Slides: AROM;Both;5 reps Hip ABduction/ADduction: AROM;Both;5 reps Straight Leg Raises: AROM;Both;10 reps Long Arc Quad: AROM;Both;10 reps Knee Flexion: AROM;Both;10 reps   Assessment/Plan    PT Assessment Patient needs continued PT services  PT Problem List Decreased strength;Decreased activity tolerance;Decreased balance;Decreased knowledge of use of DME;Decreased mobility       PT Treatment Interventions DME instruction;Gait training;Functional mobility training;Balance training;Therapeutic exercise;Therapeutic activities;Patient/family education    PT Goals (Current goals can be found in the Care Plan section)  Acute Rehab PT Goals Patient Stated Goal: To walk better PT Goal Formulation: With patient Time For Goal Achievement: 05/19/17 Potential to Achieve Goals: Fair    Frequency Min 2X/week   Barriers to discharge Inaccessible home environment;Decreased caregiver support      Co-evaluation               AM-PAC PT "6 Clicks" Daily Activity  Outcome Measure Difficulty turning over in bed (including adjusting bedclothes, sheets and blankets)?: Unable Difficulty moving from lying  on back to sitting on the side of the bed? : Unable Difficulty sitting down on and standing up from a chair with arms (e.g., wheelchair, bedside commode, etc,.)?: Unable Help needed moving to and from a bed to chair (including a wheelchair)?: A Lot Help needed walking in hospital room?: A Lot Help needed climbing 3-5 steps with a railing? : Total 6 Click Score: 8    End of Session Equipment Utilized During Treatment: Gait belt;Oxygen Activity Tolerance: Patient limited by fatigue Patient left: in chair;with chair alarm set;with call bell/phone within reach Nurse Communication: Mobility status PT Visit Diagnosis: Unsteadiness on feet (R26.81);Muscle weakness (generalized) (M62.81);Difficulty in walking, not elsewhere classified (R26.2)    Time: 1015-1050 PT Time Calculation (min) (ACUTE ONLY): 35 min   Charges:   PT Evaluation $PT Eval Low Complexity: 1 Low PT Treatments $Therapeutic Exercise: 8-22 mins   PT G Codes:        DElly Modena PT, DPT 05/06/17, 11:18 AM

## 2017-05-07 DIAGNOSIS — Z7189 Other specified counseling: Secondary | ICD-10-CM

## 2017-05-07 DIAGNOSIS — G3183 Dementia with Lewy bodies: Secondary | ICD-10-CM

## 2017-05-07 DIAGNOSIS — Z515 Encounter for palliative care: Secondary | ICD-10-CM

## 2017-05-07 DIAGNOSIS — F0280 Dementia in other diseases classified elsewhere without behavioral disturbance: Secondary | ICD-10-CM

## 2017-05-07 LAB — MAGNESIUM: Magnesium: 2 mg/dL (ref 1.7–2.4)

## 2017-05-07 LAB — POTASSIUM: POTASSIUM: 4.2 mmol/L (ref 3.5–5.1)

## 2017-05-07 MED ORDER — AMOXICILLIN-POT CLAVULANATE 400-57 MG/5ML PO SUSR: 800.0000 mg | Freq: Two times a day (BID) | ORAL | 0 refills | Status: AC

## 2017-05-07 MED ORDER — PANTOPRAZOLE SODIUM 40 MG PO TBEC: 40.0000 mg | DELAYED_RELEASE_TABLET | Freq: Two times a day (BID) | ORAL | 0 refills | Status: DC

## 2017-05-07 MED ORDER — ENSURE ENLIVE PO LIQD: 237.0000 mL | Freq: Two times a day (BID) | ORAL | 12 refills | Status: DC

## 2017-05-07 MED ORDER — METOPROLOL TARTRATE 25 MG PO TABS: 25.0000 mg | ORAL_TABLET | Freq: Two times a day (BID) | ORAL | 0 refills | Status: AC

## 2017-05-07 NOTE — Progress Notes (Signed)
Pt sleeping on rounds. No apparent distress. Close to nursing station.

## 2017-05-07 NOTE — Progress Notes (Signed)
Family Meeting Note  Advance Directive:yes  Today a meeting took place with the son- [name].  [name] unable to participate due JX:BJYNWGto:Lacked capacity dementia   The following clinical team members were present during this meeting:MD  The following were discussed:Patient's diagnosis: GI bleed, pneumonia , Patient's progosis: Unable to determine and Goals for treatment: Limited code.  I discussed with her son , he want to have intubation and ventilatory supprt for respi issues, but If her heart stops or have arrhythmia- DO NOT DO CPR or Cardioversion.   Additional follow-up to be provided: PMD  Time spent during discussion:20 minutes  Altamese DillingVaibhavkumar Leonna Schlee, MD

## 2017-05-07 NOTE — Discharge Summary (Addendum)
[facility] [place] [facility]   PATIENT NAME: Rita Vang    MR#:  [id number]  DATE OF BIRTH:  12-03-1924  DATE OF ADMISSION:  05/01/2017 ADMITTING PHYSICIAN: Arnaldo Natal, MD  DATE OF DISCHARGE: 05/07/2017   PRIMARY CARE PHYSICIAN: Mick Sell, MD    ADMISSION DIAGNOSIS:  Hemorrhagic shock (HCC) [R57.8] Lactic acidosis [E87.2] Upper GI bleed [K92.2]  DISCHARGE DIAGNOSIS:  Active Problems:   Upper GI bleed   Acute gastrointestinal hemorrhage   Gastric irritation   Palliative care by specialist   DNR (do not resuscitate) discussion   Lewy body dementia without behavioral disturbance   SECONDARY DIAGNOSIS:   Past Medical History:  Diagnosis Date  . Anxiety   . Anxiety disorder    unspecified  . Cerebral infarction (HCC)   . CVA (cerebral vascular accident) (HCC)   . Dementia   . Depression   . GERD (gastroesophageal reflux disease)   . History of recurrent UTIs   . HLD (hyperlipidemia)   . HTN (hypertension)   . Vertigo    syncope    HOSPITAL COURSE:   *COPD exacerbation with acute hypoxic respiratory failure On IV steroids and nebs Continues to need 2 L oxygen and has acute wheezing.   Much improved, comfortable now.  * Upper GI Bleed - no obvious source of bleeding-s/p hemostat clips for possible old lesion Protonix BID Monitor Hb.  Stable  will stop aspirin on discharge.  * Acute blood loss anemia due to GI bleed Stable  *Ventilator dependent respiratory failure. Extubated  * Acute blood loss anemia secondary to GI bleed.   Status post blood transfusion. On PPI  *Epistaxis.  Had a rhino pocket.  Removed by patient.  No further bleeding.  * Dementia  *DVT prophylaxis with SCDs    DISCHARGE CONDITIONS:   Stable.  CONSULTS OBTAINED:  Treatment Team:  Bud Face, MD  DRUG ALLERGIES:   Allergies  Allergen Reactions  . Clarithromycin     Other reaction(s): Abdominal Pain   . Oxycontin [Oxycodone Hcl] Other (See Comments)    Hallucinations   . Sulfamethoxazole-Trimethoprim     Other reaction(s): Other (See Comments) Chest pain    DISCHARGE MEDICATIONS:   Allergies as of 05/07/2017      Reactions   Clarithromycin    Other reaction(s): Abdominal Pain   Oxycontin [oxycodone Hcl] Other (See Comments)   Hallucinations   Sulfamethoxazole-trimethoprim    Other reaction(s): Other (See Comments) Chest pain      Medication List    STOP taking these medications   aspirin 325 MG tablet   furosemide 20 MG tablet Commonly known as:  LASIX   galantamine 4 MG tablet Commonly known as:  RAZADYNE   Glucosamine 500 MG Caps   guaiFENesin-dextromethorphan 100-10 MG/5ML syrup Commonly known as:  ROBITUSSIN DM   ipratropium 0.02 % nebulizer solution Commonly known as:  ATROVENT   levalbuterol 1.25 MG/0.5ML nebulizer solution Commonly known as:  XOPENEX   valACYclovir 500 MG tablet Commonly known as:  VALTREX     TAKE these medications   albuterol 108 (90 Base) MCG/ACT inhaler Commonly known as:  PROVENTIL HFA;VENTOLIN HFA Inhale 2 puffs into the lungs every 6 (six) hours as needed for wheezing or shortness of breath.   alum & mag hydroxide-simeth 200-200-20 MG/5ML suspension Commonly known as:  MAALOX/MYLANTA Take 30 mLs by mouth every 6 (six) hours as needed for indigestion or heartburn.   amoxicillin-clavulanate 400-57 MG/5ML suspension Commonly known as:  AUGMENTIN Take 10 mLs (800 mg total) by mouth every 12 (twelve) hours for 4 days.   ATROVENT HFA 17 MCG/ACT inhaler Generic drug:  ipratropium Inhale 2 puffs into the lungs 3 (three) times daily.   cholecalciferol 1000 units tablet Commonly known as:  VITAMIN D Take 1,000 Units by mouth daily.   citalopram 20 MG tablet Commonly known as:  CELEXA Take 1 tablet by mouth daily.   donepezil 10 MG tablet Commonly known as:  ARICEPT Take 1 tablet by mouth at bedtime.   feeding  supplement (ENSURE ENLIVE) Liqd Take 237 mLs by mouth 2 (two) times daily between meals.   fluticasone 50 MCG/ACT nasal spray Commonly known as:  FLONASE Place 1 spray into both nostrils daily.   Fluticasone-Salmeterol 100-50 MCG/DOSE Aepb Commonly known as:  ADVAIR Inhale 1 puff into the lungs 2 (two) times daily.   glycopyrrolate 1 MG tablet Commonly known as:  ROBINUL Take 1 mg by mouth daily.   I-VITE PROTECT PO Take 1 tablet by mouth 2 (two) times daily.   Magnesium Oxide 250 MG Tabs Take 1 tablet by mouth daily.   metoprolol tartrate 25 MG tablet Commonly known as:  LOPRESSOR Take 1 tablet (25 mg total) by mouth 2 (two) times daily.   MINERAL OIL PO Take 5 mLs by mouth at bedtime.   MIRALAX packet Generic drug:  polyethylene glycol Take 17 g by mouth daily as needed for mild constipation.   pantoprazole 40 MG tablet Commonly known as:  PROTONIX Take 1 tablet (40 mg total) by mouth 2 (two) times daily before a meal.   potassium chloride SA 20 MEQ tablet Commonly known as:  K-DUR,KLOR-CON Take 20 mEq by mouth daily.   QUEtiapine 25 MG tablet Commonly known as:  SEROQUEL Take 25 mg by mouth at bedtime.   sennosides-docusate sodium 8.6-50 MG tablet Commonly known as:  SENOKOT-S Take 1 tablet by mouth every other day.   simvastatin 20 MG tablet Commonly known as:  ZOCOR Take 1 tablet by mouth daily.   sodium chloride 0.65 % nasal spray Commonly known as:  OCEAN Place 2 sprays into the nose as needed for congestion. Into each nostril for congestion, dry sinuses, irritation. May keep at bedside.   traMADol 50 MG tablet Commonly known as:  ULTRAM Take 50 mg by mouth 2 (two) times daily.        DISCHARGE INSTRUCTIONS:    Follow with PMD in 1-2 weeks.  If you experience worsening of your admission symptoms, develop shortness of breath, life threatening emergency, suicidal or homicidal thoughts you must seek medical attention immediately by calling 911  or calling your MD immediately  if symptoms less severe.  You Must read complete instructions/literature along with all the possible adverse reactions/side effects for all the Medicines you take and that have been prescribed to you. Take any new Medicines after you have completely understood and accept all the possible adverse reactions/side effects.   Please note  You were cared for by a hospitalist during your hospital stay. If you have any questions about your discharge medications or the care you received while you were in the hospital after you are discharged, you can call the unit and asked to speak with the hospitalist on call if the hospitalist that took care of you is not available. Once you are discharged, your primary care physician will handle any further medical issues. Please note that NO REFILLS for any discharge medications will be authorized once you are  discharged, as it is imperative that you return to your primary care physician (or establish a relationship with a primary care physician if you do not have one) for your aftercare needs so that they can reassess your need for medications and monitor your lab values.    Today   CHIEF COMPLAINT:   Chief Complaint  Patient presents with  . Hematemesis    HISTORY OF PRESENT ILLNESS:  Rita Vang  is a 84 y.o. female with a known history of recent hospital admission for sepsis and pneumonia with elevated troponin as well as A. fib, hypertension, history of CVA and GERD presents to the emergency department after an episode of hematemesis.  The patient vomited blood that covered her chest and hair and face.  She became hypotensive and was not protecting her airway which prompted the emergency department staff to intubate her.  Transfusion of packed red blood cells was ordered prior to the emergency department staff calling the hospitalist service for admission.   VITAL SIGNS:  Blood pressure (!) 154/73, pulse 63, temperature  98.4 F (36.9 C), temperature source Oral, resp. rate 18, height 5\' 2"  (1.575 m), weight 60.1 kg (132 lb 8 oz), SpO2 96 %.  I/O:    Intake/Output Summary (Last 24 hours) at 05/07/2017 1339 Last data filed at 05/07/2017 0647 Gross per 24 hour  Intake 0 ml  Output 500 ml  Net -500 ml    PHYSICAL EXAMINATION:   GENERAL:  84 y.o.-year-old patient lying in the bed , dypneic EYES: Pupils equal, round, reactive to light and accommodation. No scleral icterus. Extraocular muscles intact.  HEENT: Head atraumatic, normocephalic.  NECK:  Supple, no jugular venous distention. No thyroid enlargement, no tenderness.  LUNGS: bilateral wheezing and decreased air entry CARDIOVASCULAR: S1, S2 normal. No murmurs, rubs, or gallops.  ABDOMEN: Soft, nontender, nondistended. Bowel sounds present. No organomegaly or mass.  EXTREMITIES: No cyanosis, clubbing or edema b/l.    NEUROLOGIC: Moves all 4 extremities.   PSYCHIATRIC: The patient is pleasantly confused SKIN: No obvious rash, lesion, or ulcer.     DATA REVIEW:   CBC Recent Labs  Lab 05/04/17 0406  05/06/17 0426  WBC 17.2*  --   --   HGB 9.7*   < > 9.3*  HCT 29.0*  --   --   PLT 116*  --   --    < > = values in this interval not displayed.    Chemistries  Recent Labs  Lab 05/01/17 0225  05/06/17 0426 05/07/17 0318  NA 142   < > 141  --   K 4.0   < > 3.6 4.2  CL 110   < > 99*  --   CO2 20*   < > 33*  --   GLUCOSE 245*   < > 94  --   BUN 29*   < > 28*  --   CREATININE 1.10*   < > 0.81  --   CALCIUM 8.3*   < > 7.9*  --   MG  --    < > 1.8 2.0  AST 34  --   --   --   ALT 13*  --   --   --   ALKPHOS 95  --   --   --   BILITOT 0.5  --   --   --    < > = values in this interval not displayed.    Cardiac Enzymes Recent Labs  Lab 05/01/17  0225  TROPONINI <0.03    Microbiology Results  Results for orders placed or performed during the hospital encounter of 05/01/17  MRSA PCR Screening     Status: None   Collection Time:  05/01/17  4:22 AM  Result Value Ref Range Status   MRSA by PCR NEGATIVE NEGATIVE Final    Comment:        The GeneXpert MRSA Assay (FDA approved for NASAL specimens only), is one component of a comprehensive MRSA colonization surveillance program. It is not intended to diagnose MRSA infection nor to guide or monitor treatment for MRSA infections. Performed at [facility], [address], [place], [place] [address]     RADIOLOGY:  No results found.  EKG:   Orders placed or performed during the hospital encounter of 05/01/17  . EKG 12-Lead  . EKG 12-Lead    Management plans discussed with the patient, family and they are in agreement.  CODE STATUS: partial code. I discussed with her son , he want to have intubation and ventilatory supprt for respi issues, but If her heart stops or have arrhythmia- DO NOT DO CPR or Cardioversion.     Code Status Orders  (From admission, onward)        Start     Ordered   05/01/17 0415  Full code  Continuous     05/01/17 0414    Code Status History    Date Active Date Inactive Code Status Order ID Comments User Context   03/06/2017 2221 03/12/2017 1838 Full Code [id number]  Oralia Manis, MD ED   08/23/2014 1801 08/24/2014 1810 Full Code [id number]  Adrian Saran, MD ED      TOTAL TIME TAKING CARE OF THIS PATIENT: 35 minutes.    Altamese Dilling M.D on 05/07/2017 at 1:39 PM  Between 7am to 6pm - Pager - [phone]  After 6pm go to [email or web address] - password [email or web address]  [facility]  Office  [phone]  CC: Primary care physician; Mick Sell, MD   Note: This dictation was prepared with Dragon dictation along with smaller phrase technology. Any transcriptional errors that result from this process are unintentional.

## 2017-05-07 NOTE — Consult Note (Signed)
Pharmacy Electrolyte Monitoring Consult:  Pharmacy consulted to assist in monitoring and replacing electrolytes in this 84 y.o. female admitted on 05/01/2017 with hematemesis-Afib  Labs:  Sodium (mmol/L)  Date Value  05/06/2017 141  11/19/2012 135 (L)   Potassium (mmol/L)  Date Value  05/07/2017 4.2  09/13/2013 4.6   Magnesium (mg/dL)  Date Value  13/08/657804/24/2019 2.0   Phosphorus (mg/dL)  Date Value  46/96/295204/23/2019 3.4   Calcium (mg/dL)  Date Value  84/13/244004/23/2019 7.9 (L)   Calcium, Total (mg/dL)  Date Value  10/27/253611/06/2012 9.2   Albumin (g/dL)  Date Value  64/40/347404/18/2019 2.6 (L)  11/19/2012 3.9     Plan: Due to Afib will target a goal of K >4 and Mg >2. Continue KCl 40 MEQ daily and Magox 400mg  daily. Recheck in 2 days. If stable, will sign off  Pharmacy will continue to monitor and adjust per consult.    Olene FlossMelissa D Dalaya Suppa, Pharm.D, BCPS Clinical Pharmacist

## 2017-05-07 NOTE — Progress Notes (Signed)
Sleeping on rounds. Slept all night, no apparent distress. Call bell in reach, close to nurses station.

## 2017-05-07 NOTE — Progress Notes (Signed)
Patient ready for discharge to [facility] today. Tresa EndoKelly, Admissions Coordinator at [facility] is aware. Patient and son [name] are aware of above. Discharge orders were sent to [facility] via [facility]. RN will call transport and report. CSW signing off. Please reconsult if needs arise.   Ruthe Mannanandace Nickie Deren MSW, [address]CSWA [phone]

## 2017-05-07 NOTE — Clinical Social Work Placement (Signed)
   CLINICAL SOCIAL WORK PLACEMENT  NOTE  Date:  05/07/2017  Patient Details  Name: Rita Vang MRN: [id number] Date of Birth: 12/12/1924  Clinical Social Work is seeking post-discharge placement for this patient at the Skilled  Nursing Facility level of care (*CSW will initial, date and re-position this form in  chart as items are completed):  Yes   Patient/family provided with [facility] Clinical Social Work Department's list of facilities offering this level of care within the geographic area requested by the patient (or if unable, by the patient's family).  Yes   Patient/family informed of their freedom to choose among providers that offer the needed level of care, that participate in Medicare, Medicaid or managed care program needed by the patient, have an available bed and are willing to accept the patient.  Yes   Patient/family informed of [facility]'s ownership interest in [facility] and [facility], as well as of the fact that they are under no obligation to receive care at these facilities.  PASRR submitted to EDS on       PASRR number received on       Existing PASRR number confirmed on 05/05/17     FL2 transmitted to all facilities in geographic area requested by pt/family on 05/05/17     FL2 transmitted to all facilities within larger geographic area on       Patient informed that his/her managed care company has contracts with or will negotiate with certain facilities, including the following:        Yes   Patient/family informed of bed offers received.  Patient chooses bed at [facility] )     Physician recommends and patient chooses bed at [facility](SNF)    Patient to be transferred to [facility]) on 05/07/17.  Patient to be transferred to facility by EMS     Patient family notified on 05/07/17 of transfer.  Name of family member notified:  Rita Vang      PHYSICIAN       Additional Comment:     _______________________________________________ Ruthe Mannanandace  Jennavie Martinek, LCSWA 05/07/2017, 11:46 AM

## 2017-05-07 NOTE — Progress Notes (Signed)
Called report to [facility] No further questions. EMS called for transfer.

## 2017-06-17 ENCOUNTER — Emergency Department: Payer: Medicare Other

## 2017-06-17 ENCOUNTER — Observation Stay
Admit: 2017-06-17 | Discharge: 2017-06-17 | Disposition: A | Discharge status: Home / Self Care | Payer: Medicare Other | Attending: Internal Medicine | Admitting: Internal Medicine

## 2017-06-17 ENCOUNTER — Observation Stay
Admission: EM | Admit: 2017-06-17 | Discharge: 2017-06-18 | Disposition: A | Discharge status: Home / Self Care | Payer: Medicare Other | Attending: Internal Medicine | Admitting: Internal Medicine

## 2017-06-17 ENCOUNTER — Other Ambulatory Visit: Payer: Self-pay

## 2017-06-17 ENCOUNTER — Encounter: Payer: Self-pay | Admitting: Emergency Medicine

## 2017-06-17 DIAGNOSIS — I493 Ventricular premature depolarization: Secondary | ICD-10-CM | POA: Insufficient documentation

## 2017-06-17 DIAGNOSIS — I129 Hypertensive chronic kidney disease with stage 1 through stage 4 chronic kidney disease, or unspecified chronic kidney disease: Secondary | ICD-10-CM | POA: Insufficient documentation

## 2017-06-17 DIAGNOSIS — F329 Major depressive disorder, single episode, unspecified: Secondary | ICD-10-CM | POA: Insufficient documentation

## 2017-06-17 DIAGNOSIS — I7389 Other specified peripheral vascular diseases: Secondary | ICD-10-CM | POA: Insufficient documentation

## 2017-06-17 DIAGNOSIS — K219 Gastro-esophageal reflux disease without esophagitis: Secondary | ICD-10-CM | POA: Insufficient documentation

## 2017-06-17 DIAGNOSIS — Z6827 Body mass index (BMI) 27.0-27.9, adult: Secondary | ICD-10-CM | POA: Insufficient documentation

## 2017-06-17 DIAGNOSIS — E46 Unspecified protein-calorie malnutrition: Secondary | ICD-10-CM | POA: Insufficient documentation

## 2017-06-17 DIAGNOSIS — I639 Cerebral infarction, unspecified: Secondary | ICD-10-CM | POA: Diagnosis not present

## 2017-06-17 DIAGNOSIS — I083 Combined rheumatic disorders of mitral, aortic and tricuspid valves: Secondary | ICD-10-CM | POA: Diagnosis not present

## 2017-06-17 DIAGNOSIS — Z96652 Presence of left artificial knee joint: Secondary | ICD-10-CM | POA: Diagnosis not present

## 2017-06-17 DIAGNOSIS — Z882 Allergy status to sulfonamides status: Secondary | ICD-10-CM | POA: Diagnosis not present

## 2017-06-17 DIAGNOSIS — Z888 Allergy status to other drugs, medicaments and biological substances status: Secondary | ICD-10-CM | POA: Diagnosis not present

## 2017-06-17 DIAGNOSIS — L8990 Pressure ulcer of unspecified site, unspecified stage: Secondary | ICD-10-CM | POA: Insufficient documentation

## 2017-06-17 DIAGNOSIS — G459 Transient cerebral ischemic attack, unspecified: Principal | ICD-10-CM | POA: Insufficient documentation

## 2017-06-17 DIAGNOSIS — F419 Anxiety disorder, unspecified: Secondary | ICD-10-CM | POA: Diagnosis not present

## 2017-06-17 DIAGNOSIS — R42 Dizziness and giddiness: Secondary | ICD-10-CM | POA: Insufficient documentation

## 2017-06-17 DIAGNOSIS — Z881 Allergy status to other antibiotic agents status: Secondary | ICD-10-CM | POA: Diagnosis not present

## 2017-06-17 DIAGNOSIS — Z79899 Other long term (current) drug therapy: Secondary | ICD-10-CM | POA: Insufficient documentation

## 2017-06-17 DIAGNOSIS — E860 Dehydration: Secondary | ICD-10-CM | POA: Diagnosis not present

## 2017-06-17 DIAGNOSIS — G3183 Dementia with Lewy bodies: Secondary | ICD-10-CM | POA: Insufficient documentation

## 2017-06-17 DIAGNOSIS — Z8249 Family history of ischemic heart disease and other diseases of the circulatory system: Secondary | ICD-10-CM | POA: Diagnosis not present

## 2017-06-17 DIAGNOSIS — E785 Hyperlipidemia, unspecified: Secondary | ICD-10-CM | POA: Diagnosis not present

## 2017-06-17 DIAGNOSIS — Z87440 Personal history of urinary (tract) infections: Secondary | ICD-10-CM | POA: Insufficient documentation

## 2017-06-17 DIAGNOSIS — Z820 Family history of epilepsy and other diseases of the nervous system: Secondary | ICD-10-CM | POA: Diagnosis not present

## 2017-06-17 DIAGNOSIS — Z8673 Personal history of transient ischemic attack (TIA), and cerebral infarction without residual deficits: Secondary | ICD-10-CM | POA: Diagnosis not present

## 2017-06-17 DIAGNOSIS — Z9841 Cataract extraction status, right eye: Secondary | ICD-10-CM | POA: Diagnosis not present

## 2017-06-17 DIAGNOSIS — Z9049 Acquired absence of other specified parts of digestive tract: Secondary | ICD-10-CM | POA: Insufficient documentation

## 2017-06-17 DIAGNOSIS — I671 Cerebral aneurysm, nonruptured: Secondary | ICD-10-CM | POA: Diagnosis not present

## 2017-06-17 DIAGNOSIS — N183 Chronic kidney disease, stage 3 (moderate): Secondary | ICD-10-CM | POA: Insufficient documentation

## 2017-06-17 DIAGNOSIS — Z87891 Personal history of nicotine dependence: Secondary | ICD-10-CM | POA: Diagnosis not present

## 2017-06-17 DIAGNOSIS — F0280 Dementia in other diseases classified elsewhere without behavioral disturbance: Secondary | ICD-10-CM | POA: Insufficient documentation

## 2017-06-17 DIAGNOSIS — I491 Atrial premature depolarization: Secondary | ICD-10-CM | POA: Insufficient documentation

## 2017-06-17 DIAGNOSIS — Z885 Allergy status to narcotic agent status: Secondary | ICD-10-CM | POA: Insufficient documentation

## 2017-06-17 DIAGNOSIS — Z7982 Long term (current) use of aspirin: Secondary | ICD-10-CM | POA: Insufficient documentation

## 2017-06-17 DIAGNOSIS — Z950 Presence of cardiac pacemaker: Secondary | ICD-10-CM | POA: Insufficient documentation

## 2017-06-17 LAB — COMPREHENSIVE METABOLIC PANEL
ALT: 13 U/L — ABNORMAL LOW (ref 14–54)
AST: 22 U/L (ref 15–41)
Albumin: 2.9 g/dL — ABNORMAL LOW (ref 3.5–5.0)
Alkaline Phosphatase: 85 U/L (ref 38–126)
Anion gap: 9 (ref 5–15)
BILIRUBIN TOTAL: 0.4 mg/dL (ref 0.3–1.2)
BUN: 24 mg/dL — AB (ref 6–20)
CALCIUM: 8.5 mg/dL — AB (ref 8.9–10.3)
CO2: 29 mmol/L (ref 22–32)
Chloride: 100 mmol/L — ABNORMAL LOW (ref 101–111)
Creatinine, Ser: 1.08 mg/dL — ABNORMAL HIGH (ref 0.44–1.00)
GFR calc Af Amer: 51 mL/min — ABNORMAL LOW (ref >60)
GFR, EST NON AFRICAN AMERICAN: 44 mL/min — AB (ref >60)
GLUCOSE: 105 mg/dL — AB (ref 65–99)
POTASSIUM: 3.6 mmol/L (ref 3.5–5.1)
Sodium: 138 mmol/L (ref 135–145)
Total Protein: 5.6 g/dL — ABNORMAL LOW (ref 6.5–8.1)

## 2017-06-17 LAB — APTT: APTT: 30 s (ref 24–36)

## 2017-06-17 LAB — MRSA PCR SCREENING: MRSA BY PCR: POSITIVE — AB

## 2017-06-17 LAB — DIFFERENTIAL
BASOS ABS: 0.1 10*3/uL (ref 0–0.1)
Basophils Relative: 1 %
EOS ABS: 0.1 10*3/uL (ref 0–0.7)
Eosinophils Relative: 1 %
LYMPHS ABS: 1.5 10*3/uL (ref 1.0–3.6)
LYMPHS PCT: 12 %
MONOS PCT: 7 %
Monocytes Absolute: 0.9 10*3/uL (ref 0.2–0.9)
Neutro Abs: 10.5 10*3/uL — ABNORMAL HIGH (ref 1.4–6.5)
Neutrophils Relative %: 79 %

## 2017-06-17 LAB — CBC
HCT: 30.9 % — ABNORMAL LOW (ref 35.0–47.0)
HEMOGLOBIN: 10.1 g/dL — AB (ref 12.0–16.0)
MCH: 29.8 pg (ref 26.0–34.0)
MCHC: 32.7 g/dL (ref 32.0–36.0)
MCV: 91.1 fL (ref 80.0–100.0)
Platelets: 148 10*3/uL — ABNORMAL LOW (ref 150–440)
RBC: 3.39 MIL/uL — AB (ref 3.80–5.20)
RDW: 14.5 % (ref 11.5–14.5)
WBC: 13.1 10*3/uL — ABNORMAL HIGH (ref 3.6–11.0)

## 2017-06-17 LAB — TROPONIN I

## 2017-06-17 LAB — PROTIME-INR
INR: 1.11
Prothrombin Time: 14.2 seconds (ref 11.4–15.2)

## 2017-06-17 MED ORDER — DONEPEZIL HCL 5 MG PO TABS
10.0000 mg | ORAL_TABLET | Freq: Every day | ORAL | Status: DC
  Filled 2017-06-17: qty 2

## 2017-06-17 MED ORDER — PANTOPRAZOLE SODIUM 40 MG PO TBEC
40.0000 mg | DELAYED_RELEASE_TABLET | Freq: Every day | ORAL | Status: DC
  Administered 2017-06-18: 40 mg via ORAL
  Filled 2017-06-17: qty 1

## 2017-06-17 MED ORDER — CHLORHEXIDINE GLUCONATE CLOTH 2 % EX PADS
6.0000 | MEDICATED_PAD | Freq: Every day | CUTANEOUS | Status: DC
  Administered 2017-06-18: 6 via TOPICAL

## 2017-06-17 MED ORDER — METOPROLOL TARTRATE 25 MG PO TABS
25.0000 mg | ORAL_TABLET | Freq: Two times a day (BID) | ORAL | Status: DC
  Administered 2017-06-17 – 2017-06-18 (×2): 25 mg via ORAL
  Filled 2017-06-17 (×2): qty 1

## 2017-06-17 MED ORDER — ACETAMINOPHEN 160 MG/5ML PO SOLN
650.0000 mg | ORAL | Status: DC | PRN
  Filled 2017-06-17: qty 20.3

## 2017-06-17 MED ORDER — ASPIRIN 300 MG RE SUPP: 300.0000 mg | Freq: Every day | RECTAL | Status: DC

## 2017-06-17 MED ORDER — ACETAMINOPHEN 650 MG RE SUPP: 650.0000 mg | RECTAL | Status: DC | PRN

## 2017-06-17 MED ORDER — GALANTAMINE HYDROBROMIDE 4 MG PO TABS
4.0000 mg | ORAL_TABLET | Freq: Two times a day (BID) | ORAL | Status: DC
  Administered 2017-06-17 – 2017-06-18 (×2): 4 mg via ORAL
  Filled 2017-06-17 (×3): qty 1

## 2017-06-17 MED ORDER — IPRATROPIUM BROMIDE 0.02 % IN SOLN
2.5000 mL | Freq: Three times a day (TID) | RESPIRATORY_TRACT | Status: DC
  Administered 2017-06-17 – 2017-06-18 (×2): 0.5 mg via RESPIRATORY_TRACT
  Filled 2017-06-17 (×2): qty 2.5

## 2017-06-17 MED ORDER — SENNA-DOCUSATE SODIUM 8.6-50 MG PO TABS
1.0000 | ORAL_TABLET | Freq: Every day | ORAL | Status: DC
Start: 2017-06-17 — End: 2017-06-17

## 2017-06-17 MED ORDER — STROKE: EARLY STAGES OF RECOVERY BOOK
Freq: Once | Status: AC
  Administered 2017-06-17: 19:00:00

## 2017-06-17 MED ORDER — ENOXAPARIN SODIUM 40 MG/0.4ML ~~LOC~~ SOLN: 40.0000 mg | SUBCUTANEOUS | Status: DC

## 2017-06-17 MED ORDER — CITALOPRAM HYDROBROMIDE 20 MG PO TABS
20.0000 mg | ORAL_TABLET | Freq: Every day | ORAL | Status: DC
  Administered 2017-06-18: 11:00:00 20 mg via ORAL
  Filled 2017-06-17: qty 1

## 2017-06-17 MED ORDER — SIMVASTATIN 20 MG PO TABS
20.0000 mg | ORAL_TABLET | Freq: Every day | ORAL | Status: DC
  Administered 2017-06-18: 20 mg via ORAL
  Filled 2017-06-17: qty 1

## 2017-06-17 MED ORDER — ASPIRIN 325 MG PO TABS
325.0000 mg | ORAL_TABLET | Freq: Every day | ORAL | Status: DC
  Administered 2017-06-17 – 2017-06-18 (×2): 325 mg via ORAL
  Filled 2017-06-17 (×2): qty 1

## 2017-06-17 MED ORDER — QUETIAPINE FUMARATE 25 MG PO TABS
25.0000 mg | ORAL_TABLET | Freq: Every day | ORAL | Status: DC
  Administered 2017-06-17: 25 mg via ORAL
  Filled 2017-06-17: qty 1

## 2017-06-17 MED ORDER — SENNOSIDES-DOCUSATE SODIUM 8.6-50 MG PO TABS
1.0000 | ORAL_TABLET | Freq: Two times a day (BID) | ORAL | Status: DC
  Administered 2017-06-17 – 2017-06-18 (×2): 1 via ORAL
  Filled 2017-06-17 (×2): qty 1

## 2017-06-17 MED ORDER — MUPIROCIN 2 % EX OINT
1.0000 "application " | TOPICAL_OINTMENT | Freq: Two times a day (BID) | CUTANEOUS | Status: DC
  Administered 2017-06-17 – 2017-06-18 (×2): 1 via NASAL
  Filled 2017-06-17: qty 22

## 2017-06-17 MED ORDER — QUETIAPINE FUMARATE 25 MG PO TABS: 25.0000 mg | ORAL_TABLET | Freq: Every day | ORAL | Status: DC

## 2017-06-17 MED ORDER — TRAMADOL HCL 50 MG PO TABS
50.0000 mg | ORAL_TABLET | Freq: Two times a day (BID) | ORAL | Status: DC
  Administered 2017-06-17 – 2017-06-18 (×2): 50 mg via ORAL
  Filled 2017-06-17 (×2): qty 1

## 2017-06-17 MED ORDER — SODIUM CHLORIDE 0.9 % IV SOLN: INTRAVENOUS | Status: DC

## 2017-06-17 MED ORDER — ACETAMINOPHEN 325 MG PO TABS: 650.0000 mg | ORAL_TABLET | ORAL | Status: DC | PRN

## 2017-06-17 MED ORDER — FLUTICASONE PROPIONATE 50 MCG/ACT NA SUSP
1.0000 | Freq: Every day | NASAL | Status: DC
  Administered 2017-06-18: 11:00:00 1 via NASAL
  Filled 2017-06-17: qty 16

## 2017-06-17 NOTE — ED Provider Notes (Signed)
[facility] Emergency Department Provider Note   ____________________________________________   First MD Initiated Contact with Patient 06/17/17 1356     (approximate)  I have reviewed the triage vital signs and the nursing notes.   HISTORY  Chief Complaint Altered Mental Status    HPI Rita Vang is a 84 y.o. female Who comes from the outset [place]. Patient reportedly had a slurry speech altered mental status and couldn't feed herself today for breakfast. Right-sided weakness was noted at 11:00. Here patient's voice is slightly slurry she is awake alert follows commands very well on rapidly and accurately as I do not see any arm weakness from one side to the other patient has slight weakness in both legs and cannot keep either leg up off the bed for more than about 3 seconds.   Past Medical History:  Diagnosis Date  . Anxiety   . Anxiety disorder    unspecified  . Cerebral infarction (HCC)   . CVA (cerebral vascular accident) (HCC)   . Dementia   . Depression   . GERD (gastroesophageal reflux disease)   . History of recurrent UTIs   . HLD (hyperlipidemia)   . HTN (hypertension)   . Vertigo    syncope    Patient Active Problem List   Diagnosis Date Noted  . Palliative care by specialist   . DNR (do not resuscitate) discussion   . Lewy body dementia without behavioral disturbance   . Upper GI bleed 05/01/2017  . Acute gastrointestinal hemorrhage   . Gastric irritation   . Herpes genitalis 04/04/2017  . Protein-calorie malnutrition (HCC) 04/04/2017  . Hypomagnesemia 04/04/2017  . Vitamin B12 deficiency 04/04/2017  . Generalized weakness 04/01/2017  . Pressure injury of skin 03/07/2017  . Sepsis (HCC) 03/06/2017  . CAP (community acquired pneumonia) 03/06/2017  . HTN (hypertension) 03/06/2017  . Anxiety 03/06/2017  . HLD (hyperlipidemia) 03/06/2017  . GERD (gastroesophageal reflux disease) 03/06/2017  . CKD (chronic kidney  disease), stage III (HCC) 03/06/2017  . CVA (cerebral infarction) 08/23/2014    Past Surgical History:  Procedure Laterality Date  . CARPAL TUNNEL RELEASE Right 1995   endoscopic   . CARPAL TUNNEL RELEASE Left 04/30/2013   endoscopic , Dr. Rosita Kea   . CATARACT EXTRACTION Right   . CHOLECYSTECTOMY    . ELBOW SURGERY Left   . ERCP W/ SPHINCTEROTOMY AND BALLOON DILATION  2008   and endoscopy  . ESOPHAGOGASTRODUODENOSCOPY (EGD) WITH PROPOFOL N/A 05/01/2017   Procedure: ESOPHAGOGASTRODUODENOSCOPY (EGD) WITH PROPOFOL;  Surgeon: Pasty Spillers, MD;  Location: [facility] ENDOSCOPY;  Service: Endoscopy;  Laterality: N/A;  . ORIF DISTAL RADIUS FRACTURE Right 2002  . TOTAL KNEE ARTHROPLASTY Left 11/23/2007  . TRIGGER FINGER RELEASE  04/30/2013   third digit, incision tendon sheath for trigger finger    Prior to Admission medications   Medication Sig Start Date End Date Taking? Authorizing Provider  citalopram (CELEXA) 20 MG tablet Take 1 tablet by mouth daily.  12/30/13  Yes [provider]  donepezil (ARICEPT) 10 MG tablet Take 1 tablet by mouth at bedtime.  02/02/14  Yes [provider]  fluticasone (FLONASE) 50 MCG/ACT nasal spray Place 1 spray into both nostrils daily.  02/24/14  Yes [provider]  furosemide (LASIX) 20 MG tablet Take 20 mg by mouth daily.   Yes [provider]  galantamine (RAZADYNE) 4 MG tablet Take 4 mg by mouth 2 (two) times daily with a meal.   Yes [provider]  ipratropium (ATROVENT HFA) 17 MCG/ACT inhaler Inhale 2 puffs into the lungs 3 (three) times daily.   Yes [provider]  metoprolol tartrate (LOPRESSOR) 25 MG tablet Take 1 tablet (25 mg total) by mouth 2 (two) times daily. 05/07/17  Yes Altamese DillingVachhani, Vaibhavkumar, MD  pantoprazole (PROTONIX) 40 MG tablet Take 1 tablet (40 mg total) by mouth 2 (two) times daily before a meal. Patient taking differently: Take 40 mg by mouth daily.  05/07/17  Yes Altamese DillingVachhani,  Vaibhavkumar, MD  QUEtiapine (SEROQUEL) 25 MG tablet Take 25 mg by mouth daily.    Yes [provider]  sennosides-docusate sodium (SENOKOT-S) 8.6-50 MG tablet Take 1 tablet by mouth daily.    Yes [provider]  simvastatin (ZOCOR) 20 MG tablet Take 1 tablet by mouth daily. 04/22/17  Yes [provider]  traMADol (ULTRAM) 50 MG tablet Take 50 mg by mouth 2 (two) times daily.    Yes [provider]  alum & mag hydroxide-simeth (MAALOX/MYLANTA) 200-200-20 MG/5ML suspension Take 30 mLs by mouth every 6 (six) hours as needed for indigestion or heartburn. Patient not taking: Reported on 06/17/2017 03/12/17   Ramonita LabGouru, Aruna, MD  feeding supplement, ENSURE ENLIVE, (ENSURE ENLIVE) LIQD Take 237 mLs by mouth 2 (two) times daily between meals. Patient not taking: Reported on 06/17/2017 05/07/17   Altamese DillingVachhani, Vaibhavkumar, MD    Allergies Sulfamethoxazole-trimethoprim; Oxycontin [oxycodone hcl]; and Clarithromycin  Family History  Problem Relation Age of Onset  . Heart attack Father   . Alzheimer's disease Mother     Social History Social History   Tobacco Use  . Smoking status: Former Smoker    Packs/day: 1.00    Types: Cigarettes  . Smokeless tobacco: Never Used  . Tobacco comment: quit at age 84  Substance Use Topics  . Alcohol use: No  . Drug use: Not on file    Review of Systems  Constitutional: No fever/chills Eyes: No visual changes. ENT: No sore throat. Cardiovascular: Denies chest pain. Respiratory: Denies shortness of breath. Gastrointestinal: No abdominal pain.  No nausea, no vomiting.  No diarrhea.  No constipation. Genitourinary: Negative for dysuria. Musculoskeletal: Negative for back pain. Skin: Negative for rash. Neurological: Negative for headaches  ____________________________________________   PHYSICAL EXAM:  VITAL SIGNS: ED Triage Vitals [06/17/17 1348]  Enc Vitals Group     BP      Pulse      Resp      Temp      Temp src        SpO2      Weight 158 lb (71.7 kg)     Height 5\' 3"  (1.6 m)     Head Circumference      Peak Flow      Pain Score 0     Pain Loc      Pain Edu?      Excl. in GC?     Constitutional: Alert and oriented. Well appearing and in no acute distress. Eyes: Conjunctivae are normal. PER. EOMI. Head: Atraumatic. Nose: No congestion/rhinnorhea. Mouth/Throat: Mucous membranes are moist.  Oropharynx non-erythematous. Neck: No stridor.   Cardiovascular: Normal rate, regular rhythm. Grossly normal heart sounds.  Good peripheral circulation. Respiratory: Normal respiratory effort.  No retractions. Lungs CTAB. Gastrointestinal: Soft and nontender. No distention. No abdominal bruits. No CVA tenderness. Musculoskeletal: No lower extremity tenderness nor edema.  No joint effusions. Neurologic:  Normal speech and language. No gross focal neurologic deficits are appreciated. cranial nerves II through XII are intact cerebellar  finger to nose is normal bilaterally rapid alternating movements may be slightly slowed on the left heel to shin is difficult to do for the patient on both sides motor strength is 5 over 5 throughout except for the patient cannot keep either leg off the bed as noted above Skin:  Skin is warm, dry and intact. No rash noted.there is some bruising Psychiatric: Mood and affect are normal. Speech and behavior are normal.  ____________________________________________   LABS (all labs ordered are listed, but only abnormal results are displayed)  Labs Reviewed  CBC - Abnormal; Notable for the following components:      Result Value   WBC 13.1 (*)    RBC 3.39 (*)    Hemoglobin 10.1 (*)    HCT 30.9 (*)    Platelets 148 (*)    All other components within normal limits  DIFFERENTIAL - Abnormal; Notable for the following components:   Neutro Abs 10.5 (*)    All other components within normal limits  COMPREHENSIVE METABOLIC PANEL - Abnormal; Notable for the following components:    Chloride 100 (*)    Glucose, Bld 105 (*)    BUN 24 (*)    Creatinine, Ser 1.08 (*)    Calcium 8.5 (*)    Total Protein 5.6 (*)    Albumin 2.9 (*)    ALT 13 (*)    GFR calc non Af Amer 44 (*)    GFR calc Af Amer 51 (*)    All other components within normal limits  PROTIME-INR  APTT  TROPONIN I  CBG MONITORING, ED   ____________________________________________  EKG  EKG read and interpreted by me shows normal sinus rhythm rate of 63 left axis no acute changescomputer is reading a demand pacemaker and PVCs do not see either one of these. ____________________________________________  RADIOLOGY  ED MD interpretation: CT shows no acute pathology per radiology   Official radiology report(s): Ct Head Code Stroke Wo Contrast  Addendum Date: 06/17/2017   ADDENDUM REPORT: 06/17/2017 14:21 ADDENDUM: Study discussed by telephone with Dr. Dorothea Glassman on 06/17/2017 at 1416 hours. Electronically Signed   By: Odessa Fleming M.D.   On: 06/17/2017 14:21   Result Date: 06/17/2017 CLINICAL DATA:  Code stroke. 84 year old female with slurred speech and right side weakness since 1100 hours today. EXAM: CT HEAD WITHOUT CONTRAST TECHNIQUE: Contiguous axial images were obtained from the base of the skull through the vertex without intravenous contrast. COMPARISON:  Brain MRI and noncontrast head CT 08/23/2014. FINDINGS: Brain: Chronic lacunar infarcts in the bilateral deep gray matter. Patchy and confluent bilateral cerebral white matter hypodensity appears stable. The chronic patchy hypodensity in the pons. No midline shift, ventriculomegaly, mass effect, evidence of mass lesion, intracranial hemorrhage or evidence of cortically based acute infarction. No cortical encephalomalacia identified. Vascular: Calcified atherosclerosis at the skull base. No suspicious intracranial vascular hyperdensity. Skull: No acute osseous abnormality identified. Sinuses/Orbits: Visualized paranasal sinuses are stable and well pneumatized.  Stable chronic mastoid sclerosis greater on the right. Other: No acute orbit or scalp soft tissue finding. ASPECTS [facility] Stroke Program Early CT Score) - Ganglionic level infarction (caudate, lentiform nuclei, internal capsule, insula, M1-M3 cortex): 7 - Supraganglionic infarction (M4-M6 cortex): 3 Total score (0-10 with 10 being normal): 10 IMPRESSION: 1. No acute cortically based infarct or acute intracranial hemorrhage identified. ASPECTS is 10. 2. Chronic small vessel disease appears stable since 2016. Electronically Signed: By: Odessa Fleming M.D. On: 06/17/2017 14:12    ____________________________________________   PROCEDURES  Procedure(s) performed:   Procedures  Critical Care performed:   ____________________________________________   INITIAL IMPRESSION / ASSESSMENT AND PLAN / ED COURSE  patient only has some slurry speech left. She however does have a history worrisome for TIA or stroke.Dr. Thad Ranger neurologist has seen the patient and feels she should be hospital. We will complete stroke workup there.        ____________________________________________   FINAL CLINICAL IMPRESSION(S) / ED DIAGNOSES  Final diagnoses:  Cerebrovascular accident (CVA), unspecified mechanism [facility])     ED Discharge Orders    None       Note:  This document was prepared using Dragon voice recognition software and may include unintentional dictation errors.    Arnaldo Natal, MD 06/17/17 1505

## 2017-06-17 NOTE — Progress Notes (Signed)
   06/17/17 1357  Clinical Encounter Type  Visited With Family;Patient not available  Visit Type Code  Spiritual Encounters  Spiritual Needs Emotional;Prayer   Chaplain responded to code stroke, met patient's son in hallway.  Chaplain and son recognized one another from a prior admission of patient.  Patient's son spoke of patient's current health.  During conversation, patient arrived back.  Physician arrived and began working with patient.  Chaplain maintained pastoral presence and offered silent prayers for patient, her son, and care team.  Patient's son open to ongoing chaplain follow up.  Chaplain also encouraged patient son to have chaplain paged as needed.

## 2017-06-17 NOTE — Progress Notes (Signed)
Notified Dr Tobi BastosPyreddy that pt wheezes on auscultation, IVF infusing. Pt passed swallow screen in the ED and needs a diet order. Also notified MD that per pt's son, pt was admitted last April for GI bleed and required 3xunits of blood. Per MD to d/c IVF and lovenox and to order heart healthy diet.

## 2017-06-17 NOTE — ED Notes (Signed)
Assumed care of pt who is resting on stretcher with even, unlabored respirations. VSS. Pt denies any pain or discomfort at this time. Pt son remains at bedside. Pt/family updated with plan of care. Will continue to monitor.

## 2017-06-17 NOTE — Progress Notes (Signed)
   06/17/17 1900  Clinical Encounter Type  Visited With Patient  Visit Type Follow-up  Spiritual Encounters  Spiritual Needs Emotional   Chaplain followed up with patient from earlier code stroke.  Patient expressed feeling positively, said she was "ok" and talked about how she'd been "up and down the hallway today, now things are finally settled."  Patient expressed feelings of fatigue.  Chaplain excused herself and encouraged patient to have staff contact chaplain as needed.

## 2017-06-17 NOTE — Progress Notes (Signed)
   06/17/17 1445  Clinical Encounter Type  Visited With Patient and family together  Visit Type Follow-up  Spiritual Encounters  Spiritual Needs Emotional   While on unit, chaplain checked in with patient and her son.  Son reported that they are still awaiting news.  Patient also expressed openness to ongoing chaplain support.

## 2017-06-17 NOTE — H&P (Signed)
[facility] [place] at [facility]   PATIENT NAME: Rita Vang    MR#:  [id number]  DATE OF BIRTH:  [birth year]  DATE OF ADMISSION:  06/17/2017  PRIMARY CARE PHYSICIAN: Mick Sell, MD   REQUESTING/REFERRING PHYSICIAN:   CHIEF COMPLAINT:   Chief Complaint  Patient presents with  . Altered Mental Status    HISTORY OF PRESENT ILLNESS: Rita Vang  is a 84 y.o. female with a known history of anxiety disorder, CVA, GERD, hyperlipidemia, hypertension [place] at [facility].  Patient at this morning noticed difficulty in speech.  No complaints of any tingling or numbness in any part of the body.  No focal weakness.  Patient was brought to the emergency room for difficulty in speech.  Her speech is improved in the emergency room.  No facial droop.  Patient was worked up with CT head which showed old strokes but no acute abnormality.  PAST MEDICAL HISTORY:   Past Medical History:  Diagnosis Date  . Anxiety   . Anxiety disorder    unspecified  . Cerebral infarction (HCC)   . CVA (cerebral vascular accident) (HCC)   . Dementia   . Depression   . GERD (gastroesophageal reflux disease)   . History of recurrent UTIs   . HLD (hyperlipidemia)   . HTN (hypertension)   . Vertigo    syncope    PAST SURGICAL HISTORY:  Past Surgical History:  Procedure Laterality Date  . CARPAL TUNNEL RELEASE Right 1995   endoscopic   . CARPAL TUNNEL RELEASE Left 04/30/2013   endoscopic , Dr. Rosita Kea   . CATARACT EXTRACTION Right   . CHOLECYSTECTOMY    . ELBOW SURGERY Left   . ERCP W/ SPHINCTEROTOMY AND BALLOON DILATION  2008   and endoscopy  . ESOPHAGOGASTRODUODENOSCOPY (EGD) WITH PROPOFOL N/A 05/01/2017   Procedure: ESOPHAGOGASTRODUODENOSCOPY (EGD) WITH PROPOFOL;  Surgeon: Pasty Spillers, MD;  Location: [facility] ENDOSCOPY;  Service: Endoscopy;  Laterality: N/A;  . ORIF DISTAL RADIUS FRACTURE Right 2002  . TOTAL KNEE ARTHROPLASTY Left  11/23/2007  . TRIGGER FINGER RELEASE  04/30/2013   third digit, incision tendon sheath for trigger finger    SOCIAL HISTORY:  Social History   Tobacco Use  . Smoking status: Former Smoker    Packs/day: 1.00    Types: Cigarettes  . Smokeless tobacco: Never Used  . Tobacco comment: quit at age 32  Substance Use Topics  . Alcohol use: No    FAMILY HISTORY:  Family History  Problem Relation Age of Onset  . Heart attack Father   . Alzheimer's disease Mother     DRUG ALLERGIES:  Allergies  Allergen Reactions  . Sulfamethoxazole-Trimethoprim Other (See Comments)    Chest pain  . Oxycontin [Oxycodone Hcl] Other (See Comments)    Hallucinations   . Clarithromycin Other (See Comments)    Abdominal pain     REVIEW OF SYSTEMS:   CONSTITUTIONAL: No fever, fatigue or weakness.  EYES: No blurred or double vision.  EARS, NOSE, AND THROAT: No tinnitus or ear pain.  RESPIRATORY: No cough, shortness of breath, wheezing or hemoptysis.  CARDIOVASCULAR: No chest pain, orthopnea, edema.  GASTROINTESTINAL: No nausea, vomiting, diarrhea or abdominal pain.  GENITOURINARY: No dysuria, hematuria.  ENDOCRINE: No polyuria, nocturia,  HEMATOLOGY: No anemia, easy bruising or bleeding SKIN: No rash or lesion. MUSCULOSKELETAL: No joint pain or arthritis.   NEUROLOGIC: No tingling, numbness, weakness.  Difficulty in speech PSYCHIATRY: No anxiety or  depression.   MEDICATIONS AT HOME:  Prior to Admission medications   Medication Sig Start Date End Date Taking? Authorizing Provider  citalopram (CELEXA) 20 MG tablet Take 1 tablet by mouth daily.  12/30/13  Yes [provider]  donepezil (ARICEPT) 10 MG tablet Take 1 tablet by mouth at bedtime.  02/02/14  Yes [provider]  fluticasone (FLONASE) 50 MCG/ACT nasal spray Place 1 spray into both nostrils daily.  02/24/14  Yes [provider]  furosemide (LASIX) 20 MG tablet Take 20 mg by mouth daily.   Yes [provider]  galantamine (RAZADYNE) 4 MG tablet Take 4 mg by mouth 2 (two) times daily with a meal.   Yes [provider]  ipratropium (ATROVENT HFA) 17 MCG/ACT inhaler Inhale 2 puffs into the lungs 3 (three) times daily.   Yes [provider]  metoprolol tartrate (LOPRESSOR) 25 MG tablet Take 1 tablet (25 mg total) by mouth 2 (two) times daily. 05/07/17  Yes Altamese DillingVachhani, Vaibhavkumar, MD  pantoprazole (PROTONIX) 40 MG tablet Take 1 tablet (40 mg total) by mouth 2 (two) times daily before a meal. Patient taking differently: Take 40 mg by mouth daily.  05/07/17  Yes Altamese DillingVachhani, Vaibhavkumar, MD  QUEtiapine (SEROQUEL) 25 MG tablet Take 25 mg by mouth daily.    Yes [provider]  sennosides-docusate sodium (SENOKOT-S) 8.6-50 MG tablet Take 1 tablet by mouth daily.    Yes [provider]  simvastatin (ZOCOR) 20 MG tablet Take 1 tablet by mouth daily. 04/22/17  Yes [provider]  traMADol (ULTRAM) 50 MG tablet Take 50 mg by mouth 2 (two) times daily.    Yes [provider]  alum & mag hydroxide-simeth (MAALOX/MYLANTA) 200-200-20 MG/5ML suspension Take 30 mLs by mouth every 6 (six) hours as needed for indigestion or heartburn. Patient not taking: Reported on 06/17/2017 03/12/17   Ramonita LabGouru, Aruna, MD  feeding supplement, ENSURE ENLIVE, (ENSURE ENLIVE) LIQD Take 237 mLs by mouth 2 (two) times daily between meals. Patient not taking: Reported on 06/17/2017 05/07/17   Altamese DillingVachhani, Vaibhavkumar, MD      PHYSICAL EXAMINATION:   VITAL SIGNS: Blood pressure 121/65, pulse 69, temperature 98.1 F (36.7 C), resp. rate (!) 21, height 5\' 3"  (1.6 m), weight 71.7 kg (158 lb), SpO2 94 %.  GENERAL:  84 y.o.-year-old patient lying in the bed with no acute distress.  EYES: Pupils equal, round, reactive to light and accommodation. No scleral icterus. Extraocular muscles intact.  HEENT: Head atraumatic, normocephalic. Oropharynx and nasopharynx clear.  NECK:  Supple, no jugular  venous distention. No thyroid enlargement, no tenderness.  LUNGS: Normal breath sounds bilaterally, no wheezing, rales,rhonchi or crepitation. No use of accessory muscles of respiration.  CARDIOVASCULAR: S1, S2 normal. No murmurs, rubs, or gallops.  ABDOMEN: Soft, nontender, nondistended. Bowel sounds present. No organomegaly or mass.  EXTREMITIES: No pedal edema, cyanosis, or clubbing.  NEUROLOGIC: Cranial nerves II through XII are intact. Muscle strength 5/5 in all extremities. Sensation intact. Gait not checked.  Speech improved Hard of hearing PSYCHIATRIC: The patient is alert and oriented x 3.  SKIN: No obvious rash, lesion, or ulcer.   LABORATORY PANEL:   CBC Recent Labs  Lab 06/17/17 1409  WBC 13.1*  HGB 10.1*  HCT 30.9*  PLT 148*  MCV 91.1  MCH 29.8  MCHC 32.7  RDW 14.5  LYMPHSABS 1.5  MONOABS 0.9  EOSABS 0.1  BASOSABS 0.1   ------------------------------------------------------------------------------------------------------------------  Chemistries  Recent Labs  Lab 06/17/17 1409  NA 138  K 3.6  CL 100*  CO2 29  GLUCOSE 105*  BUN 24*  CREATININE 1.08*  CALCIUM 8.5*  AST 22  ALT 13*  ALKPHOS 85  BILITOT 0.4   ------------------------------------------------------------------------------------------------------------------ estimated creatinine clearance is 32.8 mL/min (A) (by C-G formula based on SCr of 1.08 mg/dL (H)). ------------------------------------------------------------------------------------------------------------------ No results for input(s): TSH, T4TOTAL, T3FREE, THYROIDAB in the last 72 hours.  Invalid input(s): FREET3   Coagulation profile Recent Labs  Lab 06/17/17 1409  INR 1.11   ------------------------------------------------------------------------------------------------------------------- No results for input(s): DDIMER in the last 72  hours. -------------------------------------------------------------------------------------------------------------------  Cardiac Enzymes Recent Labs  Lab 06/17/17 1409  TROPONINI <0.03   ------------------------------------------------------------------------------------------------------------------ Invalid input(s): POCBNP  ---------------------------------------------------------------------------------------------------------------  Urinalysis    Component Value Date/Time   COLORURINE YELLOW (A) 04/16/2017 1440   APPEARANCEUR CLEAR (A) 04/16/2017 1440   APPEARANCEUR Clear 11/19/2012 2122   LABSPEC 1.014 04/16/2017 1440   LABSPEC 1.034 11/19/2012 2122   PHURINE 6.0 04/16/2017 1440   GLUCOSEU NEGATIVE 04/16/2017 1440   GLUCOSEU Negative 11/19/2012 2122   HGBUR NEGATIVE 04/16/2017 1440   BILIRUBINUR NEGATIVE 04/16/2017 1440   BILIRUBINUR Negative 11/19/2012 2122   KETONESUR NEGATIVE 04/16/2017 1440   PROTEINUR NEGATIVE 04/16/2017 1440   NITRITE NEGATIVE 04/16/2017 1440   LEUKOCYTESUR NEGATIVE 04/16/2017 1440   LEUKOCYTESUR Negative 11/19/2012 2122     RADIOLOGY: Ct Head Code Stroke Wo Contrast  Addendum Date: 06/17/2017   ADDENDUM REPORT: 06/17/2017 14:21 ADDENDUM: Study discussed by telephone with Dr. Dorothea Glassman on 06/17/2017 at 1416 hours. Electronically Signed   By: Odessa Fleming M.D.   On: 06/17/2017 14:21   Result Date: 06/17/2017 CLINICAL DATA:  Code stroke. 84 year old female with slurred speech and right side weakness since 1100 hours today. EXAM: CT HEAD WITHOUT CONTRAST TECHNIQUE: Contiguous axial images were obtained from the base of the skull through the vertex without intravenous contrast. COMPARISON:  Brain MRI and noncontrast head CT 08/23/2014. FINDINGS: Brain: Chronic lacunar infarcts in the bilateral deep gray matter. Patchy and confluent bilateral cerebral white matter hypodensity appears stable. The chronic patchy hypodensity in the pons. No midline shift,  ventriculomegaly, mass effect, evidence of mass lesion, intracranial hemorrhage or evidence of cortically based acute infarction. No cortical encephalomalacia identified. Vascular: Calcified atherosclerosis at the skull base. No suspicious intracranial vascular hyperdensity. Skull: No acute osseous abnormality identified. Sinuses/Orbits: Visualized paranasal sinuses are stable and well pneumatized. Stable chronic mastoid sclerosis greater on the right. Other: No acute orbit or scalp soft tissue finding. ASPECTS [facility] Stroke Program Early CT Score) - Ganglionic level infarction (caudate, lentiform nuclei, internal capsule, insula, M1-M3 cortex): 7 - Supraganglionic infarction (M4-M6 cortex): 3 Total score (0-10 with 10 being normal): 10 IMPRESSION: 1. No acute cortically based infarct or acute intracranial hemorrhage identified. ASPECTS is 10. 2. Chronic small vessel disease appears stable since 2016. Electronically Signed: By: Odessa Fleming M.D. On: 06/17/2017 14:12    EKG: Orders placed or performed during the hospital encounter of 06/17/17  . ED EKG  . ED EKG  . EKG 12-Lead  . EKG 12-Lead    IMPRESSION AND PLAN:  84 year old elderly female patient with history of CVA, hypertension, hyperlipidemia, anxiety disorder presented to the emergency room with difficulty in speech.  - Transient ischemic attack Speech has improved Check MRI and MRA brain to assess for any CVA Carotid ultrasound to rule out obstruction Check echocardiogram Neurology consultation  start patient on oral aspirin 325 family daily  -Hyperlipidemia Resume statin medication  -Leukocytosis Check urinalysis for  any infection  -DVT prophylaxis subcu Lovenox daily  -Hypertension  resume oral metoprolol at home dose  All the records are reviewed and case discussed with ED provider. Management plans discussed with the patient, family and they are in agreement.  CODE STATUS:Partial code Code Status History    Date Active  Date Inactive Code Status Order ID Comments User Context   05/07/2017 1140 05/07/2017 1817 Partial Code [id number]  Altamese Dilling, MD Inpatient   05/01/2017 0414 05/07/2017 1139 Full Code [id number]  Arnaldo Natal, MD Inpatient   03/06/2017 2221 03/12/2017 1838 Full Code [id number]  Oralia Manis, MD ED   08/23/2014 1801 08/24/2014 1810 Full Code [id number]  Adrian Saran, MD ED    Questions for Most Recent Historical Code Status (Order [id number])    Question Answer Comment   In the event of cardiac or respiratory ARREST: Initiate Code Blue, Call Rapid Response No    In the event of cardiac or respiratory ARREST: Perform CPR No    In the event of cardiac or respiratory ARREST: Perform Intubation/Mechanical Ventilation Yes    In the event of cardiac or respiratory ARREST: Use NIPPV/BiPAp only if indicated Yes    In the event of cardiac or respiratory ARREST: Administer ACLS medications if indicated No    In the event of cardiac or respiratory ARREST: Perform Defibrillation or Cardioversion if indicated No         Advance Directive Documentation     Most Recent Value  Type of Advance Directive  Healthcare Power of Attorney, Living will  Pre-existing out of facility DNR order (yellow form or pink MOST form)  -  "MOST" Form in Place?  -       TOTAL TIME TAKING CARE OF THIS PATIENT: 53 minutes.    Ihor Austin M.D on 06/17/2017 at 4:20 PM  Between 7am to 6pm - Pager - [phone]  After 6pm go to [email or web address] - password [email or web address] [facility]  [facility]  Office  [phone]  CC: Primary care physician; Mick Sell, MD

## 2017-06-17 NOTE — ED Notes (Signed)
Patient transported to CT 

## 2017-06-17 NOTE — ED Notes (Signed)
RNx2 and tech rolled and cleaned pt, fresh linens applied. Pt transported to floor on hospital bed.

## 2017-06-17 NOTE — ED Notes (Signed)
Report called to receiving RN on floor and pt ready for transport. Pt/family updated with plan of care. VSS.

## 2017-06-17 NOTE — Consult Note (Addendum)
Referring Physician: Darnelle Catalan    Chief Complaint: Slurred speech, AMS  HPI: Rita Vang is an 84 y.o. female who is currently in a memory care facility.  This morning at breakfast was felt to be at baseline.  At about 1100 was found by staff seated with head aback and O2 off.  Speech was slurred at that time.  When visited by son at lunch felt to have slurred speech and was unable to feed self.  Son felt patient was altered as well.  With no improvement patient was brought in for evaluation.   Initial NIHSS of 3.   Date last known well: Date: 06/17/2017 Time last known well: Time: 08:30 tPA Given: No: Outside time window  Past Medical History:  Diagnosis Date  . Anxiety   . Anxiety disorder    unspecified  . Cerebral infarction (HCC)   . CVA (cerebral vascular accident) (HCC)   . Dementia   . Depression   . GERD (gastroesophageal reflux disease)   . History of recurrent UTIs   . HLD (hyperlipidemia)   . HTN (hypertension)   . Vertigo    syncope    Past Surgical History:  Procedure Laterality Date  . CARPAL TUNNEL RELEASE Right 1995   endoscopic   . CARPAL TUNNEL RELEASE Left 04/30/2013   endoscopic , Dr. Rosita Kea   . CATARACT EXTRACTION Right   . CHOLECYSTECTOMY    . ELBOW SURGERY Left   . ERCP W/ SPHINCTEROTOMY AND BALLOON DILATION  2008   and endoscopy  . ESOPHAGOGASTRODUODENOSCOPY (EGD) WITH PROPOFOL N/A 05/01/2017   Procedure: ESOPHAGOGASTRODUODENOSCOPY (EGD) WITH PROPOFOL;  Surgeon: Pasty Spillers, MD;  Location: [facility] ENDOSCOPY;  Service: Endoscopy;  Laterality: N/A;  . ORIF DISTAL RADIUS FRACTURE Right 2002  . TOTAL KNEE ARTHROPLASTY Left 11/23/2007  . TRIGGER FINGER RELEASE  04/30/2013   third digit, incision tendon sheath for trigger finger    Family History  Problem Relation Age of Onset  . Heart attack Father   . Alzheimer's disease Mother    Social History:  reports that she has quit smoking. Her smoking use included cigarettes. She smoked 1.00  pack per day. She has never used smokeless tobacco. She reports that she does not drink alcohol. Her drug history is not on file.  Allergies:  Allergies  Allergen Reactions  . Sulfamethoxazole-Trimethoprim Other (See Comments)    Chest pain  . Oxycontin [Oxycodone Hcl] Other (See Comments)    Hallucinations   . Clarithromycin Other (See Comments)    Abdominal pain     Medications: I have reviewed the patient's current medications. Prior to Admission:  Prior to Admission medications   Medication Sig Start Date End Date Taking? Authorizing Provider  albuterol (PROVENTIL HFA;VENTOLIN HFA) 108 (90 Base) MCG/ACT inhaler Inhale 2 puffs into the lungs every 6 (six) hours as needed for wheezing or shortness of breath.    [provider]  alum & mag hydroxide-simeth (MAALOX/MYLANTA) 200-200-20 MG/5ML suspension Take 30 mLs by mouth every 6 (six) hours as needed for indigestion or heartburn. 03/12/17   Ramonita Lab, MD  cholecalciferol (VITAMIN D) 1000 UNITS tablet Take 1,000 Units by mouth daily.    [provider]  citalopram (CELEXA) 20 MG tablet Take 1 tablet by mouth daily.  12/30/13   [provider]  donepezil (ARICEPT) 10 MG tablet Take 1 tablet by mouth at bedtime.  02/02/14   [provider]  feeding supplement, ENSURE ENLIVE, (ENSURE ENLIVE) LIQD Take 237 mLs by mouth  2 (two) times daily between meals. 05/07/17   Altamese DillingVachhani, Vaibhavkumar, MD  fluticasone (FLONASE) 50 MCG/ACT nasal spray Place 1 spray into both nostrils daily.  02/24/14   [provider]  Fluticasone-Salmeterol (ADVAIR) 100-50 MCG/DOSE AEPB Inhale 1 puff into the lungs 2 (two) times daily.     [provider]  glycopyrrolate (ROBINUL) 1 MG tablet Take 1 mg by mouth daily.     [provider]  ipratropium (ATROVENT HFA) 17 MCG/ACT inhaler Inhale 2 puffs into the lungs 3 (three) times daily.    [provider]  Magnesium Oxide 250 MG TABS Take 1 tablet by  mouth daily.    [provider]  metoprolol tartrate (LOPRESSOR) 25 MG tablet Take 1 tablet (25 mg total) by mouth 2 (two) times daily. 05/07/17   Altamese DillingVachhani, Vaibhavkumar, MD  MINERAL OIL PO Take 5 mLs by mouth at bedtime.    [provider]  Multiple Vitamins-Minerals (I-VITE PROTECT PO) Take 1 tablet by mouth 2 (two) times daily.     [provider]  pantoprazole (PROTONIX) 40 MG tablet Take 1 tablet (40 mg total) by mouth 2 (two) times daily before a meal. 05/07/17   Altamese DillingVachhani, Vaibhavkumar, MD  polyethylene glycol [facility](MIRALAX) packet Take 17 g by mouth daily as needed for mild constipation.     [provider]  potassium chloride SA (K-DUR,KLOR-CON) 20 MEQ tablet Take 20 mEq by mouth daily.    [provider]  QUEtiapine (SEROQUEL) 25 MG tablet Take 25 mg by mouth at bedtime.    [provider]  sennosides-docusate sodium (SENOKOT-S) 8.6-50 MG tablet Take 1 tablet by mouth every other day.     [provider]  simvastatin (ZOCOR) 20 MG tablet Take 1 tablet by mouth daily. 04/22/17   [provider]  sodium chloride (OCEAN) 0.65 % nasal spray Place 2 sprays into the nose as needed for congestion. Into each nostril for congestion, dry sinuses, irritation. May keep at bedside.     [provider]  traMADol (ULTRAM) 50 MG tablet Take 50 mg by mouth 2 (two) times daily.     [provider]    ROS: History obtained from the patient  General ROS: negative for - chills, fatigue, fever, night sweats, weight gain or weight loss Psychological ROS: memory difficulties Ophthalmic ROS: negative for - blurry vision, double vision, eye pain or loss of vision ENT ROS: negative for - epistaxis, nasal discharge, oral lesions, sore throat, tinnitus or vertigo Allergy and Immunology ROS: negative for - hives or itchy/watery eyes Hematological and Lymphatic ROS: negative for - bleeding problems, bruising or swollen lymph  nodes Endocrine ROS: negative for - galactorrhea, hair pattern changes, polydipsia/polyuria or temperature intolerance Respiratory ROS: negative for - cough, hemoptysis, shortness of breath or wheezing Cardiovascular ROS: negative for - chest pain, dyspnea on exertion, edema or irregular heartbeat Gastrointestinal ROS: negative for - abdominal pain, diarrhea, hematemesis, nausea/vomiting or stool incontinence Genito-Urinary ROS: negative for - dysuria, hematuria, incontinence or urinary frequency/urgency Musculoskeletal ROS: right shoulder pain and limitation in ROM Neurological ROS: as noted in HPI Dermatological ROS: negative for rash and skin lesion changes  Physical Examination: Blood pressure 121/65, pulse 69, resp. rate (!) 21, height 5\' 3"  (1.6 m), weight 71.7 kg (158 lb), SpO2 94 %.  HEENT-  Normocephalic, no lesions, without obvious abnormality.  Normal external eye and conjunctiva.  Normal TM's bilaterally.  Normal auditory canals and external ears. Normal external nose, mucus membranes and septum.  Normal pharynx. Cardiovascular- S1, S2 normal, pulses palpable throughout   Lungs- chest clear, no wheezing, rales, normal symmetric air entry Abdomen- soft, non-tender; bowel sounds normal; no masses,  no organomegaly Extremities- no edema Lymph-no adenopathy palpable Musculoskeletal-no joint tenderness, deformity or swelling Skin-warm and dry, no hyperpigmentation, vitiligo, or suspicious lesions  Neurological Examination   Mental Status: Alert.  Not oriented to age or year.  Speech fluent but mildly dysarthric.   Able to follow 3 step commands with some significant reinforcement. Cranial Nerves: II: Discs flat bilaterally; Visual fields grossly normal, pupils equal, round, reactive to light and accommodation III,IV, VI: ptosis not present, extra-ocular motions intact bilaterally V,VII: smile symmetric, facial light touch sensation normal bilaterally VIII: HOH IX,X: gag reflex  present XI: bilateral shoulder shrug XII: midline tongue extension Motor: Able to lift all extremities against gravity with no focal weakness noted.   Sensory: Pinprick and light touch intact throughout, bilaterally Deep Tendon Reflexes: 2+ and symmetric with absent AJ's bilaterally Plantars: Right: mute   Left: mute Cerebellar: Normal finger-to-nose and normal heel-to-shin testing bilaterally Gait: not tested due to safety concerns   Laboratory Studies:  Basic Metabolic Panel: No results for input(s): NA, K, CL, CO2, GLUCOSE, BUN, CREATININE, CALCIUM, MG, PHOS in the last 168 hours.  Liver Function Tests: No results for input(s): AST, ALT, ALKPHOS, BILITOT, PROT, ALBUMIN in the last 168 hours. No results for input(s): LIPASE, AMYLASE in the last 168 hours. No results for input(s): AMMONIA in the last 168 hours.  CBC: No results for input(s): WBC, NEUTROABS, HGB, HCT, MCV, PLT in the last 168 hours.  Cardiac Enzymes: No results for input(s): CKTOTAL, CKMB, CKMBINDEX, TROPONINI in the last 168 hours.  BNP: Invalid input(s): POCBNP  CBG: No results for input(s): GLUCAP in the last 168 hours.  Microbiology: Results for orders placed or performed during the hospital encounter of 05/01/17  MRSA PCR Screening     Status: None   Collection Time: 05/01/17  4:22 AM  Result Value Ref Range Status   MRSA by PCR NEGATIVE NEGATIVE Final    Comment:        The GeneXpert MRSA Assay (FDA approved for NASAL specimens only), is one component of a comprehensive MRSA colonization surveillance program. It is not intended to diagnose MRSA infection nor to guide or monitor treatment for MRSA infections. Performed at [facility], [address] [place], [place] [address]     Coagulation Studies: No results for input(s): LABPROT, INR in the last 72 hours.  Urinalysis: No results for input(s): COLORURINE, LABSPEC, PHURINE, GLUCOSEU, HGBUR, BILIRUBINUR, KETONESUR, PROTEINUR,  UROBILINOGEN, NITRITE, LEUKOCYTESUR in the last 168 hours.  Invalid input(s): APPERANCEUR  Lipid Panel:    Component Value Date/Time   CHOL 117 04/02/2017 0623   TRIG 81 04/02/2017 0623   HDL 37 (L) 04/02/2017 0623   CHOLHDL 3.2 04/02/2017 0623   VLDL 16 04/02/2017 0623   LDLCALC 64 04/02/2017 0623    HgbA1C: No results found for: HGBA1C  Urine Drug Screen:  No results found for: LABOPIA, COCAINSCRNUR, LABBENZ, AMPHETMU, THCU, LABBARB  Alcohol Level: No results for input(s): ETH in the last 168 hours.  Other results: EKG: 63 bpm  Imaging: Ct Head Code Stroke Wo Contrast  Result Date: 06/17/2017 CLINICAL DATA:  Code stroke. 84 year old female with slurred speech and right side weakness since 1100 hours today. EXAM: CT HEAD WITHOUT CONTRAST TECHNIQUE: Contiguous axial images were obtained from the base of the skull through the vertex without intravenous  contrast. COMPARISON:  Brain MRI and noncontrast head CT 08/23/2014. FINDINGS: Brain: Chronic lacunar infarcts in the bilateral deep gray matter. Patchy and confluent bilateral cerebral white matter hypodensity appears stable. The chronic patchy hypodensity in the pons. No midline shift, ventriculomegaly, mass effect, evidence of mass lesion, intracranial hemorrhage or evidence of cortically based acute infarction. No cortical encephalomalacia identified. Vascular: Calcified atherosclerosis at the skull base. No suspicious intracranial vascular hyperdensity. Skull: No acute osseous abnormality identified. Sinuses/Orbits: Visualized paranasal sinuses are stable and well pneumatized. Stable chronic mastoid sclerosis greater on the right. Other: No acute orbit or scalp soft tissue finding. ASPECTS [facility] Stroke Program Early CT Score) - Ganglionic level infarction (caudate, lentiform nuclei, internal capsule, insula, M1-M3 cortex): 7 - Supraganglionic infarction (M4-M6 cortex): 3 Total score (0-10 with 10 being normal): 10 IMPRESSION: 1. No  acute cortically based infarct or acute intracranial hemorrhage identified. ASPECTS is 10. 2. Chronic small vessel disease appears stable since 2016. Electronically Signed   By: Odessa Fleming M.D.   On: 06/17/2017 14:12    Assessment: 84 y.o. female presenting with slurred speech and difficulty feeding self earlier today.  Now with mild slurring of speech.  Head CT reviewed and shows no acute changes.  Acute infarct remains in the differential.  Patient on no antiplatelet therapy prior to presentation.    Stroke Risk Factors - hyperlipidemia and hypertension  Plan: 1. HgbA1c, fasting lipid panel 2. MRI, MRA  of the brain without contrast 3. PT consult, OT consult, Speech consult 4. Echocardiogram 5. Carotid dopplers 6. Prophylactic therapy-Antiplatelet med: Aspirin - dose 325mg .  May be administered rectally if unable to swallow safely 7. NPO until RN stroke swallow screen 8. Telemetry monitoring 9. Frequent neuro checks  Case discussed with Dr. Jory Ee, MD Neurology [phone] 06/17/2017, 2:21 PM

## 2017-06-17 NOTE — ED Triage Notes (Signed)
Pt in via [facility] [place], family reports noticing altered mental status, slurred speech, right side weakness at 1100 today.  Pt alert, able to follow commands.  EDP called to bedside.

## 2017-06-17 NOTE — Progress Notes (Signed)
Advanced care plan. Purpose of the Encounter: CODE STATUS Parties in Attendance:Patient and family Patient's Decision Capacity:Good Subjective/Patient's story: Presented for difficulty in speech Objective/Medical story Evaluation for TIA Goals of care determination:  Advance care directives discussed with patient Patient wants intubation, ventilator but no cardiac resuscitation, no cpr CODE STATUS: Partial code Time spent discussing advanced care planning: 16 minutes

## 2017-06-18 ENCOUNTER — Observation Stay: Payer: Medicare Other

## 2017-06-18 DIAGNOSIS — G459 Transient cerebral ischemic attack, unspecified: Secondary | ICD-10-CM | POA: Diagnosis not present

## 2017-06-18 LAB — URINALYSIS, ROUTINE W REFLEX MICROSCOPIC
Bilirubin Urine: NEGATIVE
GLUCOSE, UA: NEGATIVE mg/dL
HGB URINE DIPSTICK: NEGATIVE
KETONES UR: NEGATIVE mg/dL
Leukocytes, UA: NEGATIVE
Nitrite: NEGATIVE
PH: 5 (ref 5.0–8.0)
Protein, ur: NEGATIVE mg/dL
Specific Gravity, Urine: 1.018 (ref 1.005–1.030)

## 2017-06-18 LAB — LIPID PANEL
CHOL/HDL RATIO: 3.3 ratio
Cholesterol: 119 mg/dL (ref 0–200)
HDL: 36 mg/dL — AB (ref >40)
LDL CALC: 71 mg/dL (ref 0–99)
Triglycerides: 62 mg/dL (ref <150)
VLDL: 12 mg/dL (ref 0–40)

## 2017-06-18 LAB — HEMOGLOBIN A1C
Hgb A1c MFr Bld: 4.6 % — ABNORMAL LOW (ref 4.8–5.6)
Mean Plasma Glucose: 85.32 mg/dL

## 2017-06-18 LAB — ECHOCARDIOGRAM COMPLETE
HEIGHTINCHES: 63 in
WEIGHTICAEL: 2528 [oz_av]

## 2017-06-18 MED ORDER — ASPIRIN EC 81 MG PO TBEC
81.0000 mg | DELAYED_RELEASE_TABLET | Freq: Every day | ORAL | 2 refills | Status: AC
Start: 2017-06-18 — End: 2017-09-16

## 2017-06-18 NOTE — Care Management Note (Signed)
Case Management Note  Patient Details  Name: Rita Vang MRN: [id number] Date of Birth: 05/04/1924  Subjective/Objective:     Patient admitted to [facility] under observation status for TIA. [facility] lives at [facility]. Pt currently un oriented so RNCM placed call to her son Fransisca KaufmannScott Siska [phone]. [name] tells me that the patient is currently open with [facility] with HHPT services. She also has utilized Production designer, theatre/television/filmprivate sitters. No other DME used. Son prefers her to restart these services at discharge. Dr Sampson GoonFitzgerald is her PCP. No reported issues obtaining medications.                Action/Plan: RNCM will continue to follow for possible resumption of HHPT with [facility]  Expected Discharge Date:                  Expected Discharge Plan:     In-House Referral:     Discharge planning Services     Post Acute Care Choice:    Choice offered to:     DME Arranged:    DME Agency:     HH Arranged:    HH Agency:     Status of Service:     If discussed at Long Length of Stay Meetings, dates discussed:    Additional Comments:  Virgel ManifoldJosh A Shivani Barrantes, RN 06/18/2017, 11:25 AM

## 2017-06-18 NOTE — Progress Notes (Signed)
Pt is being [facility] to [facility] [address] [facility]. [facility]. Copy of AVS placed in discharge packet. Awaiting EMS.

## 2017-06-18 NOTE — Evaluation (Signed)
Physical Therapy Evaluation Patient Details Name: Rita SnareFrances L Derasmo MRN: [id number] DOB: 09/26/1924 Today's Date: 06/18/2017   History of Present Illness  presented to ER secondary to change in speech, mild word-finding difficulties; admitted for TIA/CVA work up.  CTH and MRI negative for acute infarct.  Clinical Impression  Upon evaluation, patient oriented to self only; rests with eyes closed, but opens eyes to voice, light touch.  Speech and conversation fluent, [facility]; able to follow commands and appropriately make needs known.  Limited command-following and baseline cognitive deficits noted (impacting ability to actively participate with session).  Bilat UE/LE generally weak and deconditioned globally; no focal weakness, sensory or coordination deficit appreciated.  Currently requiring max/total assist for bed mobility and unsupported sitting balance; generally resistant to mobility attempts from therapist.  Unsafe/unable to attempt additional mobility at this time. Would benefit from skilled PT to address above deficits and promote optimal return to PLOF; Recommend transition to HHPT upon discharge from acute hospitalization IF patient able to demonstrate appropriate participate/progress with skilled intervention.     Follow Up Recommendations Home health PT    Equipment Recommendations       Recommendations for Other Services       Precautions / Restrictions Precautions Precautions: Fall Restrictions Weight Bearing Restrictions: No      Mobility  Bed Mobility Overal bed mobility: Needs Assistance Bed Mobility: Supine to Sit;Sit to Supine     Supine to sit: Total assist Sit to supine: Total assist   General bed mobility comments: hand-over-hand for task initiation, UE/LE movement and unsupported sitting balance  Transfers                 General transfer comment: unsafe/unable  Ambulation/Gait             General Gait Details: unsafe/unable  Stairs             Wheelchair Mobility    Modified Rankin (Stroke Patients Only)       Balance Overall balance assessment: Needs assistance Sitting-balance support: No upper extremity supported;Feet supported Sitting balance-Leahy Scale: Zero Sitting balance - Comments: very heavy posterior trunk lean/weight shift (resisting movement); absent attempts at active participation with movement transition Postural control: Posterior lean     Standing balance comment: unsafe/unable                             Pertinent Vitals/Pain Pain Assessment: Faces Faces Pain Scale: No hurt    Home Living Family/patient expects to be discharged to:: Assisted living                 Additional Comments: Per previous documentation, resident of [facility]    Prior Function Level of Independence: Needs assistance   Gait / Transfers Assistance Needed: from previous admission, pt reports using 4WW for amb w/ mod indep, no falls history  ADL's / Homemaking Assistance Needed: from previous admission, sitters or son come in to assist pt with bathing/dressing   Comments: Per nursing report, patient largely WC level as primary mobility.  Patient questionable historian, unable to provide accurate/reliable history.  Will verify with family/facility as available.     Hand Dominance   Dominant Hand: Right    Extremity/Trunk Assessment   Upper Extremity Assessment Upper Extremity Assessment: Generalized weakness(grossly at least 3-/5 throughout; limited by cognition)    Lower Extremity Assessment Lower Extremity Assessment: Generalized weakness(grossly at least 3-/5 throughout; limited by cognition.  Bilat  ankles to neutral)       Communication   Communication: No difficulties;HOH  Cognition Arousal/Alertness: Awake/alert Behavior During Therapy: WFL for tasks assessed/performed Overall Cognitive Status: History of cognitive impairments - at baseline                                  General Comments: oriented to self only; inconsistently follows commands, often requiring hand-over-hand for task comprehension and completion      General Comments      Exercises     Assessment/Plan    PT Assessment Patient needs continued PT services  PT Problem List Decreased strength;Decreased mobility;Decreased safety awareness;Decreased range of motion;Decreased coordination;Decreased knowledge of precautions;Decreased cognition;Decreased activity tolerance;Decreased balance;Decreased knowledge of use of DME       PT Treatment Interventions DME instruction;Functional mobility training;Therapeutic activities;Therapeutic exercise;Balance training;Neuromuscular re-education;Cognitive remediation;Patient/family education    PT Goals (Current goals can be found in the Care Plan section)  Acute Rehab PT Goals PT Goal Formulation: Patient unable to participate in goal setting Time For Goal Achievement: 07/02/17 Potential to Achieve Goals: Fair    Frequency Min 2X/week   Barriers to discharge Decreased caregiver support      Co-evaluation               AM-PAC PT "6 Clicks" Daily Activity  Outcome Measure Difficulty turning over in bed (including adjusting bedclothes, sheets and blankets)?: Unable Difficulty moving from lying on back to sitting on the side of the bed? : Unable Difficulty sitting down on and standing up from a chair with arms (e.g., wheelchair, bedside commode, etc,.)?: Unable Help needed moving to and from a bed to chair (including a wheelchair)?: Total Help needed walking in hospital room?: Total Help needed climbing 3-5 steps with a railing? : Total 6 Click Score: 6    End of Session Equipment Utilized During Treatment: Gait belt Activity Tolerance: (limited by confusion, command-following) Patient left: in bed;with call bell/phone within reach;with bed alarm set Nurse Communication: Mobility status PT Visit Diagnosis: Muscle  weakness (generalized) (M62.81);Difficulty in walking, not elsewhere classified (R26.2)    Time: 1000-1030 PT Time Calculation (min) (ACUTE ONLY): 30 min   Charges:   PT Evaluation $PT Eval Moderate Complexity: 1 Mod     PT G Codes:        Evaluation lead and completed by Iris Pert, SPT; documented, guided and directed by Cephus Slater, PT.  Tommy Rainwater. Manson Passey, PT, DPT, NCS 06/18/17, 2:05 PM [phone]

## 2017-06-18 NOTE — Progress Notes (Addendum)
Subjective: Patient in bed with eyes closed.  Reports that she is not ready to wake up fully this morning.    Objective: Current vital signs: BP 140/61 (BP Location: Left Arm)   Pulse 60   Temp 97.6 F (36.4 C) (Oral)   Resp 16   Ht 5\' 3"  (1.6 m)   Wt 71.7 kg (158 lb)   SpO2 100%   BMI 27.99 kg/m  Vital signs in last 24 hours: Temp:  [97.6 F (36.4 C)-98.7 F (37.1 C)] 97.6 F (36.4 C) (06/05 0934) Pulse Rate:  [55-69] 60 (06/05 0934) Resp:  [16-21] 16 (06/05 0934) BP: (121-153)/(61-86) 140/61 (06/05 0934) SpO2:  [94 %-100 %] 100 % (06/05 0934) FiO2 (%):  [28 %] 28 % (06/04 1739) Weight:  [71.7 kg (158 lb)] 71.7 kg (158 lb) (06/04 1348)  Intake/Output from previous day: 06/04 0701 - 06/05 0700 In: -  Out: 450 [Urine:450] Intake/Output this shift: No intake/output data recorded. Nutritional status:  Diet Order           Diet Heart Room service appropriate? Yes; Fluid consistency: Thin  Diet effective now          Neurologic Exam: Mental Status: Eyes closed.  Awake.  Follows some simple commands Cranial Nerves: III,IV, VI: extra-ocular motions intact bilaterally V,VII: smile symmetric VIII: HOH Motor: Able to lift all extremities against gravity with no focal weakness noted.    Lab Results: Basic Metabolic Panel: Recent Labs  Lab 06/17/17 1409  NA 138  K 3.6  CL 100*  CO2 29  GLUCOSE 105*  BUN 24*  CREATININE 1.08*  CALCIUM 8.5*    Liver Function Tests: Recent Labs  Lab 06/17/17 1409  AST 22  ALT 13*  ALKPHOS 85  BILITOT 0.4  PROT 5.6*  ALBUMIN 2.9*   No results for input(s): LIPASE, AMYLASE in the last 168 hours. No results for input(s): AMMONIA in the last 168 hours.  CBC: Recent Labs  Lab 06/17/17 1409  WBC 13.1*  NEUTROABS 10.5*  HGB 10.1*  HCT 30.9*  MCV 91.1  PLT 148*    Cardiac Enzymes: Recent Labs  Lab 06/17/17 1409  TROPONINI <0.03    Lipid Panel: Recent Labs  Lab 06/18/17 0508  CHOL 119  TRIG 62  HDL 36*   CHOLHDL 3.3  VLDL 12  LDLCALC 71    CBG: No results for input(s): GLUCAP in the last 168 hours.  Microbiology: Results for orders placed or performed during the hospital encounter of 06/17/17  MRSA PCR Screening     Status: Abnormal   Collection Time: 06/17/17  6:45 PM  Result Value Ref Range Status   MRSA by PCR POSITIVE (A) NEGATIVE Final    Comment:        The GeneXpert MRSA Assay (FDA approved for NASAL specimens only), is one component of a comprehensive MRSA colonization surveillance program. It is not intended to diagnose MRSA infection nor to guide or monitor treatment for MRSA infections. RESULT CALLED TO, READ BACK BY AND VERIFIED WITH: JACKIE PAGE ON 06/17/17 AT 2029 JAG Performed at [facility], [address] [place], [place] [address]     Coagulation Studies: Recent Labs    06/17/17 1409  LABPROT 14.2  INR 1.11    Imaging: Mr Brain Wo Contrast  Result Date: 06/18/2017 CLINICAL DATA:  New speech difficulty EXAM: MRI HEAD WITHOUT CONTRAST MRA HEAD WITHOUT CONTRAST TECHNIQUE: Multiplanar, multiecho pulse sequences of the brain and surrounding structures were obtained without intravenous  contrast. Angiographic images of the head were obtained using MRA technique without contrast. COMPARISON:  Head CT 06/17/2017 MRA brain 05/22/2016 MRI brain 08/23/2014 FINDINGS: MRI HEAD FINDINGS Brain: The midline structures are normal. No focal diffusion restriction to indicate acute infarct. No intraparenchymal hemorrhage. Diffuse confluent periventricular and deep white matter hyperintensity, most often a result of chronic microvascular ischemia. No mass lesion. No chronic microhemorrhage or cerebral amyloid angiopathy. Generalized atrophy. No dural abnormality or extra-axial collection. Skull and upper cervical spine: The visualized skull base, calvarium, upper cervical spine and extracranial soft tissues are normal. Sinuses/Orbits: No fluid levels or advanced  mucosal thickening. No mastoid effusion. Normal orbits. MRA HEAD FINDINGS Intracranial internal carotid arteries: Unchanged fusiform dilatation of the left ICA cavernous segment. Anterior cerebral arteries: Normal variant hypoplastic right A1 segment. Left ACA is normal. Normal anterior communicating artery. Middle cerebral arteries: Unchanged right MCA bifurcation aneurysm measuring 5 mm. Normal left MCA. Posterior communicating arteries: Present bilaterally. Posterior cerebral arteries: Normal.  Fetal type origins. Basilar artery: Normal. Vertebral arteries: Right-dominant. Left vertebral artery terminates in PICA. Superior cerebellar arteries: Normal. Anterior inferior cerebellar arteries: Normal. Posterior inferior cerebellar arteries: Normal. IMPRESSION: 1. Diffuse atrophy and chronic ischemic microangiopathy without acute intracranial abnormality. 2. No emergent large vessel occlusion. 3. 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 Electronically Signed   By: Deatra Robinson M.D.   On: 06/18/2017 02:32   US Carotid Bilateral (at [facility] Only)  Result Date: 06/18/2017 CLINICAL DATA:  TIA symptoms, hypertension EXAM: BILATERAL CAROTID DUPLEX ULTRASOUND TECHNIQUE: Wallace Cullens scale imaging, color Doppler and duplex ultrasound were performed of bilateral carotid and vertebral arteries in the neck. COMPARISON:  None. FINDINGS: Criteria: Quantification of carotid stenosis is based on velocity parameters that correlate the residual internal carotid diameter with NASCET-based stenosis levels, using the diameter of the distal internal carotid lumen as the denominator for stenosis measurement. The following velocity measurements were obtained: RIGHT ICA: 53/11 cm/sec CCA: 49/7 cm/sec SYSTOLIC ICA/CCA RATIO:  1.1 ECA:  92 cm/sec LEFT ICA: 98/23 cm/sec CCA: 52/10 cm/sec SYSTOLIC ICA/CCA RATIO:  1.9 ECA:  71 cm/sec RIGHT CAROTID ARTERY:  Minor echogenic shadowing plaque formation. No hemodynamically significant right ICA stenosis, velocity elevation, or turbulent flow. Degree of narrowing less than 50%. RIGHT VERTEBRAL ARTERY:  Antegrade LEFT CAROTID ARTERY: Similar scattered minor echogenic plaque formation. No hemodynamically significant left ICA stenosis, velocity elevation, or turbulent flow. LEFT VERTEBRAL ARTERY:  Antegrade IMPRESSION: Mild bilateral carotid atherosclerosis. No hemodynamically significant ICA stenosis. Degree of narrowing less than 50% bilaterally by ultrasound criteria. Patent antegrade vertebral flow bilaterally Electronically Signed   By: Judie Petit.  Shick M.D.   On: 06/18/2017 09:32   Mr Maxine Glenn Head/brain RU Cm  Result Date: 06/18/2017 CLINICAL DATA:  New speech difficulty EXAM: MRI HEAD WITHOUT CONTRAST MRA HEAD WITHOUT CONTRAST TECHNIQUE: Multiplanar, multiecho pulse sequences of the brain and surrounding structures were obtained without intravenous contrast. Angiographic images of the head were obtained using MRA technique without contrast. COMPARISON:  Head CT 06/17/2017 MRA brain 05/22/2016 MRI brain 08/23/2014 FINDINGS: MRI HEAD FINDINGS Brain: The midline structures are normal. No focal diffusion restriction to indicate acute infarct. No intraparenchymal hemorrhage. Diffuse confluent periventricular and deep white matter hyperintensity, most often a result of chronic microvascular ischemia. No mass lesion. No chronic microhemorrhage or cerebral amyloid angiopathy. Generalized atrophy. No dural abnormality or extra-axial collection. Skull and upper cervical spine: The visualized skull base, calvarium, upper  cervical spine and extracranial soft tissues are normal. Sinuses/Orbits: No fluid levels or advanced mucosal thickening. No mastoid effusion. Normal orbits. MRA HEAD FINDINGS Intracranial internal carotid arteries: Unchanged fusiform dilatation of the left ICA cavernous segment. Anterior cerebral arteries: Normal variant  hypoplastic right A1 segment. Left ACA is normal. Normal anterior communicating artery. Middle cerebral arteries: Unchanged right MCA bifurcation aneurysm measuring 5 mm. Normal left MCA. Posterior communicating arteries: Present bilaterally. Posterior cerebral arteries: Normal.  Fetal type origins. Basilar artery: Normal. Vertebral arteries: Right-dominant. Left vertebral artery terminates in PICA. Superior cerebellar arteries: Normal. Anterior inferior cerebellar arteries: Normal. Posterior inferior cerebellar arteries: Normal. IMPRESSION: 1. Diffuse atrophy and chronic ischemic microangiopathy without acute intracranial abnormality. 2. No emergent large vessel occlusion. 3. 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 Electronically Signed   By: Deatra Robinson M.D.   On: 06/18/2017 02:32   Ct Head Code Stroke Wo Contrast  Addendum Date: 06/17/2017   ADDENDUM REPORT: 06/17/2017 14:21 ADDENDUM: Study discussed by telephone with Dr. Dorothea Glassman on 06/17/2017 at 1416 hours. Electronically Signed   By: Odessa Fleming M.D.   On: 06/17/2017 14:21   Result Date: 06/17/2017 CLINICAL DATA:  Code stroke. 84 year old female with slurred speech and right side weakness since 1100 hours today. EXAM: CT HEAD WITHOUT CONTRAST TECHNIQUE: Contiguous axial images were obtained from the base of the skull through the vertex without intravenous contrast. COMPARISON:  Brain MRI and noncontrast head CT 08/23/2014. FINDINGS: Brain: Chronic lacunar infarcts in the bilateral deep gray matter. Patchy and confluent bilateral cerebral white matter hypodensity appears stable. The chronic patchy hypodensity in the pons. No midline shift, ventriculomegaly, mass effect, evidence of mass lesion, intracranial hemorrhage or evidence of cortically based acute infarction. No cortical encephalomalacia identified. Vascular: Calcified atherosclerosis at the skull base. No  suspicious intracranial vascular hyperdensity. Skull: No acute osseous abnormality identified. Sinuses/Orbits: Visualized paranasal sinuses are stable and well pneumatized. Stable chronic mastoid sclerosis greater on the right. Other: No acute orbit or scalp soft tissue finding. ASPECTS [facility] Stroke Program Early CT Score) - Ganglionic level infarction (caudate, lentiform nuclei, internal capsule, insula, M1-M3 cortex): 7 - Supraganglionic infarction (M4-M6 cortex): 3 Total score (0-10 with 10 being normal): 10 IMPRESSION: 1. No acute cortically based infarct or acute intracranial hemorrhage identified. ASPECTS is 10. 2. Chronic small vessel disease appears stable since 2016. Electronically Signed: By: Odessa Fleming M.D. On: 06/17/2017 14:12    Medications:  I have reviewed the patient's current medications. Scheduled: . aspirin  300 mg Rectal Daily   Or  . aspirin  325 mg Oral Daily  . Chlorhexidine Gluconate Cloth  6 each Topical Q0600  . citalopram  20 mg Oral Daily  . donepezil  10 mg Oral QHS  . fluticasone  1 spray Each Nare Daily  . galantamine  4 mg Oral BID WC  . ipratropium  2.5 mL Inhalation TID  . metoprolol tartrate  25 mg Oral BID  . mupirocin ointment  1 application Nasal BID  . pantoprazole  40 mg Oral Daily  . QUEtiapine  25 mg Oral QHS  . senna-docusate  1 tablet Oral BID  . simvastatin  20 mg Oral Daily  . traMADol  50 mg Oral BID    Assessment/Plan: Presenting symptoms seem to have improved.  MRI of the brain reviewed and shows no acute changes.  TIA is on the differential.  Patient taken off ASA in  April due to GIB.  On 325mg  at that time.  No source found.  Patient with some degree of dehydration on lab work.  This may be contributing to presentation as well. Carotid dopplers show no evidence of hemodynamically significant stenosis.  Echocardiogram pending.  LDL 71 and patient on a statin.  Recommendations: 1. Continue 81mg  ASA  No further neurologic intervention is  recommended at this time.  If further questions arise, please call or page at that time.  Thank you for allowing neurology to participate in the care of this patient.    LOS: 0 days   Thana FarrLeslie Miski Feldpausch, MD Neurology [phone] 06/18/2017  11:14 AM

## 2017-06-18 NOTE — Clinical Social Work Note (Signed)
[name] medically ready for discharge today back to [facility]. CSW notified patient's son Fransisca KaufmannScott Vigen [phone]. CSW also notified Boyd Kerbsenny at [facility] [address]. Son would like patient transported by EMS. He would also like to be notified when patient is leaving the hospital. RN to call report, call for transport and notify son when patient leaves.   Ruthe Mannanandace Emberli Ballester MSW, [address] [phone]

## 2017-06-18 NOTE — NC FL2 (Addendum)
[place] MEDICAID FL2 LEVEL OF CARE SCREENING TOOL     IDENTIFICATION  Patient Name: Rita Vang Birthdate: 10/04/1924 Sex: female Admission Date (Current Location): 06/17/2017  [facility] [place]   Facility and Address:  [facility], [address], [place], [place] [address]      Provider Number: [id number]  Attending Physician Name and Address:  Enid BaasKalisetti, Radhika, MD  Relative Name and Phone Number:       Current Level of Care: Hospital Recommended Level of Care: Assisted Living Facility Prior Approval Number:    Date Approved/Denied:   PASRR Number:    Discharge Plan: Domiciliary (Rest home)    Current Diagnoses: Patient Active Problem List   Diagnosis Date Noted  . TIA (transient ischemic attack) 06/17/2017  . Palliative care by specialist   . DNR (do not resuscitate) discussion   . Lewy body dementia without behavioral disturbance   . Upper GI bleed 05/01/2017  . Acute gastrointestinal hemorrhage   . Gastric irritation   . Herpes genitalis 04/04/2017  . Protein-calorie malnutrition (HCC) 04/04/2017  . Hypomagnesemia 04/04/2017  . Vitamin B12 deficiency 04/04/2017  . Generalized weakness 04/01/2017  . Pressure injury of skin 03/07/2017  . Sepsis (HCC) 03/06/2017  . CAP (community acquired pneumonia) 03/06/2017  . HTN (hypertension) 03/06/2017  . Anxiety 03/06/2017  . HLD (hyperlipidemia) 03/06/2017  . GERD (gastroesophageal reflux disease) 03/06/2017  . CKD (chronic kidney disease), stage III (HCC) 03/06/2017  . CVA (cerebral infarction) 08/23/2014    Orientation RESPIRATION BLADDER Height & Weight     Self  O2(2 liters ) Incontinent Weight: 158 lb (71.7 kg) Height:  5\' 3"  (160 cm)  BEHAVIORAL SYMPTOMS/MOOD NEUROLOGICAL BOWEL NUTRITION STATUS  (None) (None) Continent Diet(Dysphagia 3 )  AMBULATORY STATUS COMMUNICATION OF NEEDS Skin   Limited Assist Verbally Other (Comment)(Stage 2 on Left hip and  Stage 2 on left ankle )                       Personal Care Assistance Level of Assistance  Bathing, Feeding, Dressing Bathing Assistance: Limited assistance Feeding assistance: Independent Dressing Assistance: Limited assistance     Functional Limitations Info  Sight, Hearing, Speech Sight Info: Adequate Hearing Info: Adequate Speech Info: Adequate    SPECIAL CARE FACTORS FREQUENCY  PT (By licensed PT), OT (By licensed OT)                    Contractures Contractures Info: Not present    Additional Factors Info  Allergies, Code Status Code Status Info: Partial Code  Allergies Info: Sulfamethoxazole-trimethoprim, Oxycontin, Clarithromycin            Discharge Medications: Medication List    STOP taking these medications   alum & mag hydroxide-simeth 200-200-20 MG/5ML suspension Commonly known as:  MAALOX/MYLANTA   feeding supplement (ENSURE ENLIVE) Liqd   furosemide 20 MG tablet Commonly known as:  LASIX     TAKE these medications   aspirin EC 81 MG tablet Take 1 tablet (81 mg total) by mouth daily.   ATROVENT HFA 17 MCG/ACT inhaler Generic drug:  ipratropium Inhale 2 puffs into the lungs 3 (three) times daily.   citalopram 20 MG tablet Commonly known as:  CELEXA Take 1 tablet by mouth daily.   donepezil 10 MG tablet Commonly known as:  ARICEPT Take 1 tablet by mouth at bedtime.   fluticasone 50 MCG/ACT nasal spray Commonly known as:  FLONASE Place 1  spray into both nostrils daily.   galantamine 4 MG tablet Commonly known as:  RAZADYNE Take 4 mg by mouth 2 (two) times daily with a meal.   metoprolol tartrate 25 MG tablet Commonly known as:  LOPRESSOR Take 1 tablet (25 mg total) by mouth 2 (two) times daily.   pantoprazole 40 MG tablet Commonly known as:  PROTONIX Take 1 tablet (40 mg total) by mouth 2 (two) times daily before a meal. What changed:  when to take this   QUEtiapine 25 MG tablet Commonly known as:   SEROQUEL Take 25 mg by mouth daily.   sennosides-docusate sodium 8.6-50 MG tablet Commonly known as:  SENOKOT-S Take 1 tablet by mouth daily.   simvastatin 20 MG tablet Commonly known as:  ZOCOR Take 1 tablet by mouth daily.   traMADol 50 MG tablet Commonly known as:  ULTRAM Take 50 mg by mouth 2 (two) times daily.     Relevant Imaging Results:  Relevant Lab Results:   Additional Information    Chinmay Squier  Rinaldo Ratel, [address]

## 2017-06-18 NOTE — Evaluation (Signed)
Occupational Therapy Evaluation Patient Details Name: Rita Vang MRN: [id number] DOB: November 25, 1924 Today's Date: 06/18/2017    History of Present Illness Pt is a 84 y.o. female with a known history of anxiety disorder, CVA, GERD, hyperlipidemia, dementia, and HTN. Pt is a resident of [facility]. Pt with several recent admissions. Pt noted with difficulty in speech, no complaints of any tingling or numbness in any part of the body or focal weakness.  Pt brought to the ED for difficulty in speech.  Her speech is improved in the ED. No facial droop. CT and MRI reveal old strokes but no acute abnormality.   Clinical Impression   Pt seen for OT evaluation this date. Pt sleeping,wakes to OT's voice, pleasant, and oriented only to self. No family present to assist in confirming PLOF, all info obtained from chart review of previous admission. Per chart review, pt was ambulating with a 4WW without assist and family assisted pt with bathing/dressing.  [facility] at [facility], per chart review.  Pt follows commands inconsistently.  Currently pt demonstrates impairments in cognition (unclear of baseline cognitive status), strength, and activity tolerance requiring mod/max assist for LB ADL. PT denies sensory deficits. No coordination deficits noted during self feeding tasks but difficult to formally assess due to cognition. Generalized weakness but no focal weakness appreciated. Pt denies visual deficits or pain. Pt would benefit from skilled OT to address noted impairments and functional limitations (see below for any additional details) in order to maximize safety and independence while minimizing falls risk and caregiver burden.  Upon hospital discharge, recommend pt discharge back to [facility] with additional OT services to support maximal return to PLOF.     Follow Up Recommendations  Home health OT(in setting of [facility] [address])    Equipment Recommendations  Other (comment)(TBD)     Recommendations for Other Services       Precautions / Restrictions Precautions Precautions: Fall Restrictions Weight Bearing Restrictions: No      Mobility Bed Mobility Overal bed mobility: Needs Assistance Bed Mobility: Supine to Sit;Sit to Supine     Supine to sit: Mod assist;HOB elevated Sit to supine: Mod assist      Transfers                 General transfer comment: unsafe to attempt 2:2 cognition, difficulty following commands    Balance Overall balance assessment: Mild deficits observed, not formally tested                                         ADL either performed or assessed with clinical judgement   ADL Overall ADL's : Needs assistance/impaired Eating/Feeding: Bed level;Set up Eating/Feeding Details (indicate cue type and reason): set up of breakfast, VC to hold mug with both hands to decrease spillage. Pt spilled drink when attempted to hold with 1 hand. Linens and gown changed.  Grooming: Set up;Sitting;Min guard   Upper Body Bathing: Minimal assistance;Sitting   Lower Body Bathing: Sit to/from stand;Moderate assistance;Maximal assistance   Upper Body Dressing : Sitting;Minimal assistance   Lower Body Dressing: Sit to/from stand;Maximal assistance;Moderate assistance                       Vision Baseline Vision/History: (unsure, pt unable to verbalize) Patient Visual Report: No change from baseline Vision Assessment?: No apparent visual deficits  Perception     Praxis      Pertinent Vitals/Pain Pain Assessment: No/denies pain     Hand Dominance Right   Extremity/Trunk Assessment Upper Extremity Assessment Upper Extremity Assessment: Generalized weakness(grossly at least 3+/5 bilaterally)   Lower Extremity Assessment Lower Extremity Assessment: Defer to PT evaluation;Generalized weakness       Communication Communication Communication: HOH   Cognition Arousal/Alertness: Awake/alert Behavior  During Therapy: WFL for tasks assessed/performed Overall Cognitive Status: History of cognitive impairments - at baseline                                 General Comments: Pleasant, initially groggy, oriented to self, follows simple commands approx 75% of the time.    General Comments       Exercises     Shoulder Instructions      Home Living Family/patient expects to be discharged to:: Skilled nursing facility                                 Additional Comments: [facility] [place] [facility]      Prior Functioning/Environment Level of Independence: Needs assistance  Gait / Transfers Assistance Needed: from previous admission, pt reports using 4WW for amb w/ mod indep, no falls history ADL's / Homemaking Assistance Needed: from previous admission, sitters or son come in to assist pt with bathing/dressing             OT Problem List: Decreased strength;Decreased knowledge of use of DME or AE;Decreased cognition;Impaired balance (sitting and/or standing);Decreased safety awareness;Decreased activity tolerance      OT Treatment/Interventions: Self-care/ADL training;Balance training;Therapeutic exercise;Therapeutic activities;DME and/or AE instruction;Patient/family education;Cognitive remediation/compensation    OT Goals(Current goals can be found in the care plan section) Acute Rehab OT Goals OT Goal Formulation: Patient unable to participate in goal setting ADL Goals Pt Will Perform Lower Body Dressing: sit to/from stand;with mod assist;with min assist Pt Will Transfer to Toilet: with mod assist;with min assist;ambulating;bedside commode(LRAD)  OT Frequency: Min 1X/week   Barriers to D/C:            Co-evaluation              AM-PAC PT "6 Clicks" Daily Activity     Outcome Measure Help from another person eating meals?: None Help from another person taking care of personal grooming?: None Help from another person toileting, which  includes using toliet, bedpan, or urinal?: A Lot Help from another person bathing (including washing, rinsing, drying)?: A Lot Help from another person to put on and taking off regular upper body clothing?: A Little Help from another person to put on and taking off regular lower body clothing?: A Lot 6 Click Score: 17   End of Session    Activity Tolerance: Patient tolerated treatment well Patient left: in bed;with call bell/phone within reach;with bed alarm set  OT Visit Diagnosis: Other abnormalities of gait and mobility (R26.89);Muscle weakness (generalized) (M62.81);Other symptoms and signs involving cognitive function                Time: 0940-1000 OT Time Calculation (min): 20 min Charges:  OT General Charges $OT Visit: 1 Visit OT Evaluation $OT Eval Low Complexity: 1 Low  Richrd PrimeJamie Stiller, MPH, MS, OTR/L ascom [phone] 06/18/17, 10:21 AM

## 2017-06-18 NOTE — Progress Notes (Signed)
OT Cancellation Note  Patient Details Name: Rita Vang MRN: [id number] DOB: 12/09/1924   Cancelled Treatment:    Reason Eval/Treat Not Completed: Patient at procedure or test/ unavailable. Order received, chart reviewed. Pt out of room for testing. Will re-attempt OT evaluation at later date/time as pt is available and medically appropriate.  Richrd PrimeJamie Stiller, MPH, MS, OTR/L ascom [phone] 06/18/17, 8:06 AM

## 2017-06-18 NOTE — Care Management Obs Status (Signed)
MEDICARE OBSERVATION STATUS NOTIFICATION   Patient Details  Name: Rita Vang MRN: [id number] Date of Birth: 08/19/1924   Medicare Observation Status Notification Given:  Yes  patient un oriented. POA Rita Vang (son) used to assess and provide letter. Per son ok to mark "x" to document their signature and understanding  Eyad Rochford A Orell Hurtado, RN 06/18/2017, 11:35 AM

## 2017-06-18 NOTE — Care Management (Signed)
[facility] notified of discharge 

## 2017-06-18 NOTE — Clinical Social Work Note (Signed)
Clinical Social Work Assessment  Patient Details  Name: Rita Vang MRN: [id number] Date of Birth: October 25, 1924  Date of referral:  06/18/17               Reason for consult:  Facility Placement                Permission sought to share information with:  Case Manager, Customer service manager, Family Supports Permission granted to share information::  Yes, Verbal Permission Granted  Name::        Agency::     Relationship::     Contact Information:     Housing/Transportation Living arrangements for the past 2 months:  [place] of Information:  Patient Patient Interpreter Needed:  None Criminal Activity/Legal Involvement Pertinent to Current Situation/Hospitalization:  No - Comment as needed Significant Relationships:  Adult Children Lives with:  Facility Resident Do you feel safe going back to the place where you live?  Yes Need for family participation in patient care:  Yes (Comment)  Care giving concerns:  [name] a long term resident at the [facility] Worker assessment / plan:  CSW consulted for facility placement. CSW met with patient but she is not oriented. CSW contacted patient's son Safiyya Stokes [phone]. Son states that patient lives at [facility] and should return when ready. CSW explained that MD wants to discharge patient today. Son is in agreement with discharge back to [facility] and would like home health therapy set up. CSW notified RN CM of this request. CSW will continue to follow for discharge planning.   Employment status:  Retired Health visitor PT Recommendations:  Home with [employer] / Referral to community resources:     Patient/Family's Response to care:  [name] not alert and oriented   Patient/Family's Understanding of and Emotional Response to Diagnosis, Current Treatment, and Prognosis:  Son is very involved with care and wants patient to return to ALF.    Emotional Assessment Appearance:  Appears stated age Attitude/Demeanor/Rapport:  Lethargic Affect (typically observed):  Accepting Orientation:  Oriented to Self Alcohol / Substance use:  Not Applicable Psych involvement (Current and /or in the community):  No (Comment)  Discharge Needs  Concerns to be addressed:  Discharge Planning Concerns Readmission within the last 30 days:  No Current discharge risk:  Cognitively Impaired Barriers to Discharge:  No Barriers Identified   Annamaria Boots, Latanya Presser 06/18/2017, 12:09 PM

## 2017-06-18 NOTE — Discharge Summary (Addendum)
[facility] [place] [facility]   PATIENT NAME: Rita Vang    MR#:  [id number]  DATE OF BIRTH:  06-03-1924  DATE OF ADMISSION:  06/17/2017   ADMITTING PHYSICIAN: Ihor Austin, MD  DATE OF DISCHARGE:  06/18/17  PRIMARY CARE PHYSICIAN: Mick Sell, MD   ADMISSION DIAGNOSIS:   Cerebrovascular accident (CVA), unspecified mechanism (HCC) [I63.9]  DISCHARGE DIAGNOSIS:   Active Problems:   TIA (transient ischemic attack)   SECONDARY DIAGNOSIS:   Past Medical History:  Diagnosis Date  . Anxiety   . Anxiety disorder    unspecified  . Cerebral infarction (HCC)   . CVA (cerebral vascular accident) (HCC)   . Dementia   . Depression   . GERD (gastroesophageal reflux disease)   . History of recurrent UTIs   . HLD (hyperlipidemia)   . HTN (hypertension)   . Vertigo    syncope    HOSPITAL COURSE:   84 year old female with past medical history significant for early dementia, anxiety, history of prior stroke, GI bleed, GERD, hypertension and hyperlipidemia from assisted living facility was brought in secondary to difficulty in speech  1.  Dysarthria-difficulty speaking, could be TIA or dehydration. -Lasix held at discharge -MRI and MRA of the brain are without any acute findings.  Has significant cerebral atrophy -Carotid Dopplers with no hemodynamically significant stenosis.  Echo is done and pending -Appreciate neurology consult.  Since patient was on high strength aspirin which was discontinued due to GI bleed few months ago, they have recommended to start a low-dose enteric-coated aspirin for now. -Continue her statin -PT and OT recommended home health. -Updated son, agree with discharge plan -Patient does not have any word finding difficulty or comprehension trouble.  2.  Dementia-seems to be at baseline.  Continue Aricept and galantamine  3.  Depression-on Celexa and Seroquel  4.  Hyperlipidemia-on statin  Patient will be discharged  today  DISCHARGE CONDITIONS:   Guarded  CONSULTS OBTAINED:   Treatment Team:  Kym Groom, MD Thana Farr, MD  DRUG ALLERGIES:   Allergies  Allergen Reactions  . Sulfamethoxazole-Trimethoprim Other (See Comments)    Chest pain  . Oxycontin [Oxycodone Hcl] Other (See Comments)    Hallucinations   . Clarithromycin Other (See Comments)    Abdominal pain    DISCHARGE MEDICATIONS:   Allergies as of 06/18/2017      Reactions   Sulfamethoxazole-trimethoprim Other (See Comments)   Chest pain   Oxycontin [oxycodone Hcl] Other (See Comments)   Hallucinations   Clarithromycin Other (See Comments)   Abdominal pain      Medication List    STOP taking these medications   alum & mag hydroxide-simeth 200-200-20 MG/5ML suspension Commonly known as:  MAALOX/MYLANTA   feeding supplement (ENSURE ENLIVE) Liqd   furosemide 20 MG tablet Commonly known as:  LASIX     TAKE these medications   aspirin EC 81 MG tablet Take 1 tablet (81 mg total) by mouth daily.   ATROVENT HFA 17 MCG/ACT inhaler Generic drug:  ipratropium Inhale 2 puffs into the lungs 3 (three) times daily.   citalopram 20 MG tablet Commonly known as:  CELEXA Take 1 tablet by mouth daily.   donepezil 10 MG tablet Commonly known as:  ARICEPT Take 1 tablet by mouth at bedtime.   fluticasone 50 MCG/ACT nasal spray Commonly known as:  FLONASE Place 1 spray into both nostrils daily.   galantamine 4 MG tablet Commonly known as:  RAZADYNE Take 4 mg  by mouth 2 (two) times daily with a meal.   metoprolol tartrate 25 MG tablet Commonly known as:  LOPRESSOR Take 1 tablet (25 mg total) by mouth 2 (two) times daily.   pantoprazole 40 MG tablet Commonly known as:  PROTONIX Take 1 tablet (40 mg total) by mouth 2 (two) times daily before a meal. What changed:  when to take this   QUEtiapine 25 MG tablet Commonly known as:  SEROQUEL Take 25 mg by mouth daily.   sennosides-docusate sodium 8.6-50 MG  tablet Commonly known as:  SENOKOT-S Take 1 tablet by mouth daily.   simvastatin 20 MG tablet Commonly known as:  ZOCOR Take 1 tablet by mouth daily.   traMADol 50 MG tablet Commonly known as:  ULTRAM Take 50 mg by mouth 2 (two) times daily.        DISCHARGE INSTRUCTIONS:   1.  PCP follow-up in 1 to 2 weeks  DIET:   Cardiac diet  ACTIVITY:   Activity as tolerated  OXYGEN:   Home Oxygen: No.  Oxygen Delivery: room air  DISCHARGE LOCATION:   Assisted Living   If you experience worsening of your admission symptoms, develop shortness of breath, life threatening emergency, suicidal or homicidal thoughts you must seek medical attention immediately by calling 911 or calling your MD immediately  if symptoms less severe.  You Must read complete instructions/literature along with all the possible adverse reactions/side effects for all the Medicines you take and that have been prescribed to you. Take any new Medicines after you have completely understood and accpet all the possible adverse reactions/side effects.   Please note  You were cared for by a hospitalist during your hospital stay. If you have any questions about your discharge medications or the care you received while you were in the hospital after you are discharged, you can call the unit and asked to speak with the hospitalist on call if the hospitalist that took care of you is not available. Once you are discharged, your primary care physician will handle any further medical issues. Please note that NO REFILLS for any discharge medications will be authorized once you are discharged, as it is imperative that you return to your primary care physician (or establish a relationship with a primary care physician if you do not have one) for your aftercare needs so that they can reassess your need for medications and monitor your lab values.    On the day of Discharge:  VITAL SIGNS:   Blood pressure (!) 141/45, pulse (!)  52, temperature 98.4 F (36.9 C), temperature source Oral, resp. rate 16, height 5\' 3"  (1.6 m), weight 71.7 kg (158 lb), SpO2 97 %.  PHYSICAL EXAMINATION:    GENERAL:  84 y.o.-year-old elderly patient lying in the bed with no acute distress.  EYES: Pupils equal, round, reactive to light and accommodation. No scleral icterus. Extraocular muscles intact.  HEENT: Head atraumatic, normocephalic. Oropharynx and nasopharynx clear.  NECK:  Supple, no jugular venous distention. No thyroid enlargement, no tenderness.  LUNGS: Normal breath sounds bilaterally, no wheezing, rales,rhonchi or crepitation. No use of accessory muscles of respiration.  Decreased bibasilar breath sounds CARDIOVASCULAR: S1, S2 normal. No  rubs, or gallops.  2/6 systolic murmur present ABDOMEN: Soft, non-tender, non-distended. Bowel sounds present. No organomegaly or mass.  EXTREMITIES: No pedal edema, cyanosis, or clubbing.  NEUROLOGIC: Cranial nerves II through XII are intact. Muscle strength 5/5 in all extremities. Sensation intact. Gait not checked.  Global  weakness noted PSYCHIATRIC:  The patient is alert and oriented x 1.  SKIN: No obvious rash, lesion, or ulcer.   DATA REVIEW:   CBC Recent Labs  Lab 06/17/17 1409  WBC 13.1*  HGB 10.1*  HCT 30.9*  PLT 148*    Chemistries  Recent Labs  Lab 06/17/17 1409  NA 138  K 3.6  CL 100*  CO2 29  GLUCOSE 105*  BUN 24*  CREATININE 1.08*  CALCIUM 8.5*  AST 22  ALT 13*  ALKPHOS 85  BILITOT 0.4     Microbiology Results  Results for orders placed or performed during the hospital encounter of 06/17/17  MRSA PCR Screening     Status: Abnormal   Collection Time: 06/17/17  6:45 PM  Result Value Ref Range Status   MRSA by PCR POSITIVE (A) NEGATIVE Final    Comment:        The GeneXpert MRSA Assay (FDA approved for NASAL specimens only), is one component of a comprehensive MRSA colonization surveillance program. It is not intended to diagnose MRSA infection  nor to guide or monitor treatment for MRSA infections. RESULT CALLED TO, READ BACK BY AND VERIFIED WITH: JACKIE PAGE ON 06/17/17 AT 2029 JAG Performed at [facility], [address] [place] [place], [place] [address]     RADIOLOGY:  Mr Brain 32 Contrast  Result Date: 06/18/2017 CLINICAL DATA:  New speech difficulty EXAM: MRI HEAD WITHOUT CONTRAST MRA HEAD WITHOUT CONTRAST TECHNIQUE: Multiplanar, multiecho pulse sequences of the brain and surrounding structures were obtained without intravenous contrast. Angiographic images of the head were obtained using MRA technique without contrast. COMPARISON:  Head CT 06/17/2017 MRA brain 05/22/2016 MRI brain 08/23/2014 FINDINGS: MRI HEAD FINDINGS Brain: The midline structures are normal. No focal diffusion restriction to indicate acute infarct. No intraparenchymal hemorrhage. Diffuse confluent periventricular and deep white matter hyperintensity, most often a result of chronic microvascular ischemia. No mass lesion. No chronic microhemorrhage or cerebral amyloid angiopathy. Generalized atrophy. No dural abnormality or extra-axial collection. Skull and upper cervical spine: The visualized skull base, calvarium, upper cervical spine and extracranial soft tissues are normal. Sinuses/Orbits: No fluid levels or advanced mucosal thickening. No mastoid effusion. Normal orbits. MRA HEAD FINDINGS Intracranial internal carotid arteries: Unchanged fusiform dilatation of the left ICA cavernous segment. Anterior cerebral arteries: Normal variant hypoplastic right A1 segment. Left ACA is normal. Normal anterior communicating artery. Middle cerebral arteries: Unchanged right MCA bifurcation aneurysm measuring 5 mm. Normal left MCA. Posterior communicating arteries: Present bilaterally. Posterior cerebral arteries: Normal.  Fetal type origins. Basilar artery: Normal. Vertebral arteries: Right-dominant. Left vertebral artery terminates in PICA. Superior cerebellar arteries:  Normal. Anterior inferior cerebellar arteries: Normal. Posterior inferior cerebellar arteries: Normal. IMPRESSION: 1. Diffuse atrophy and chronic ischemic microangiopathy without acute intracranial abnormality. 2. No emergent large vessel occlusion. 3. 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 Electronically Signed   By: Deatra Robinson M.D.   On: 06/18/2017 02:32   US Carotid Bilateral (at [facility] And [facility] Only)  Result Date: 06/18/2017 CLINICAL DATA:  TIA symptoms, hypertension EXAM: BILATERAL CAROTID DUPLEX ULTRASOUND TECHNIQUE: Wallace Cullens scale imaging, color Doppler and duplex ultrasound were performed of bilateral carotid and vertebral arteries in the neck. COMPARISON:  None. FINDINGS: Criteria: Quantification of carotid stenosis is based on velocity parameters that correlate the residual internal carotid diameter with NASCET-based stenosis levels, using the diameter of the distal internal carotid lumen as the denominator for stenosis measurement. The following velocity  measurements were obtained: RIGHT ICA: 53/11 cm/sec CCA: 49/7 cm/sec SYSTOLIC ICA/CCA RATIO:  1.1 ECA:  92 cm/sec LEFT ICA: 98/23 cm/sec CCA: 52/10 cm/sec SYSTOLIC ICA/CCA RATIO:  1.9 ECA:  71 cm/sec RIGHT CAROTID ARTERY: Minor echogenic shadowing plaque formation. No hemodynamically significant right ICA stenosis, velocity elevation, or turbulent flow. Degree of narrowing less than 50%. RIGHT VERTEBRAL ARTERY:  Antegrade LEFT CAROTID ARTERY: Similar scattered minor echogenic plaque formation. No hemodynamically significant left ICA stenosis, velocity elevation, or turbulent flow. LEFT VERTEBRAL ARTERY:  Antegrade IMPRESSION: Mild bilateral carotid atherosclerosis. No hemodynamically significant ICA stenosis. Degree of narrowing less than 50% bilaterally by ultrasound criteria. Patent antegrade vertebral flow bilaterally Electronically Signed   By: Judie Petit.  Shick M.D.    On: 06/18/2017 09:32   Mr Maxine Glenn Head/brain ZO Cm  Result Date: 06/18/2017 CLINICAL DATA:  New speech difficulty EXAM: MRI HEAD WITHOUT CONTRAST MRA HEAD WITHOUT CONTRAST TECHNIQUE: Multiplanar, multiecho pulse sequences of the brain and surrounding structures were obtained without intravenous contrast. Angiographic images of the head were obtained using MRA technique without contrast. COMPARISON:  Head CT 06/17/2017 MRA brain 05/22/2016 MRI brain 08/23/2014 FINDINGS: MRI HEAD FINDINGS Brain: The midline structures are normal. No focal diffusion restriction to indicate acute infarct. No intraparenchymal hemorrhage. Diffuse confluent periventricular and deep white matter hyperintensity, most often a result of chronic microvascular ischemia. No mass lesion. No chronic microhemorrhage or cerebral amyloid angiopathy. Generalized atrophy. No dural abnormality or extra-axial collection. Skull and upper cervical spine: The visualized skull base, calvarium, upper cervical spine and extracranial soft tissues are normal. Sinuses/Orbits: No fluid levels or advanced mucosal thickening. No mastoid effusion. Normal orbits. MRA HEAD FINDINGS Intracranial internal carotid arteries: Unchanged fusiform dilatation of the left ICA cavernous segment. Anterior cerebral arteries: Normal variant hypoplastic right A1 segment. Left ACA is normal. Normal anterior communicating artery. Middle cerebral arteries: Unchanged right MCA bifurcation aneurysm measuring 5 mm. Normal left MCA. Posterior communicating arteries: Present bilaterally. Posterior cerebral arteries: Normal.  Fetal type origins. Basilar artery: Normal. Vertebral arteries: Right-dominant. Left vertebral artery terminates in PICA. Superior cerebellar arteries: Normal. Anterior inferior cerebellar arteries: Normal. Posterior inferior cerebellar arteries: Normal. IMPRESSION: 1. Diffuse atrophy and chronic ischemic microangiopathy without acute intracranial abnormality. 2. No  emergent large vessel occlusion. 3. 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 Electronically Signed   By: Deatra Robinson M.D.   On: 06/18/2017 02:32   Ct Head Code Stroke Wo Contrast  Addendum Date: 06/17/2017   ADDENDUM REPORT: 06/17/2017 14:21 ADDENDUM: Study discussed by telephone with Dr. Dorothea Glassman on 06/17/2017 at 1416 hours. Electronically Signed   By: Odessa Fleming M.D.   On: 06/17/2017 14:21   Result Date: 06/17/2017 CLINICAL DATA:  Code stroke. 84 year old female with slurred speech and right side weakness since 1100 hours today. EXAM: CT HEAD WITHOUT CONTRAST TECHNIQUE: Contiguous axial images were obtained from the base of the skull through the vertex without intravenous contrast. COMPARISON:  Brain MRI and noncontrast head CT 08/23/2014. FINDINGS: Brain: Chronic lacunar infarcts in the bilateral deep gray matter. Patchy and confluent bilateral cerebral white matter hypodensity appears stable. The chronic patchy hypodensity in the pons. No midline shift, ventriculomegaly, mass effect, evidence of mass lesion, intracranial hemorrhage or evidence of cortically based acute infarction. No cortical encephalomalacia identified. Vascular: Calcified atherosclerosis at the skull base. No suspicious intracranial vascular hyperdensity. Skull: No acute osseous abnormality identified. Sinuses/Orbits: Visualized paranasal sinuses are stable and well  pneumatized. Stable chronic mastoid sclerosis greater on the right. Other: No acute orbit or scalp soft tissue finding. ASPECTS [facility]([place] Stroke Program Early CT Score) - Ganglionic level infarction (caudate, lentiform nuclei, internal capsule, insula, M1-M3 cortex): 7 - Supraganglionic infarction (M4-M6 cortex): 3 Total score (0-10 with 10 being normal): 10 IMPRESSION: 1. No acute cortically based infarct or acute intracranial hemorrhage identified. ASPECTS is 10. 2. Chronic small vessel  disease appears stable since 2016. Electronically Signed: By: Odessa FlemingH  Hall M.D. On: 06/17/2017 14:12     Management plans discussed with the patient, family and they are in agreement.  CODE STATUS:     Code Status Orders  (From admission, onward)        Start     Ordered   06/17/17 1739  Limited resuscitation (code)  Continuous    Question Answer Comment  In the event of cardiac or respiratory ARREST: Initiate Code Blue, Call Rapid Response No   In the event of cardiac or respiratory ARREST: Perform CPR No   In the event of cardiac or respiratory ARREST: Perform Intubation/Mechanical Ventilation Yes   In the event of cardiac or respiratory ARREST: Use NIPPV/BiPAp only if indicated Yes   In the event of cardiac or respiratory ARREST: Administer ACLS medications if indicated No   In the event of cardiac or respiratory ARREST: Perform Defibrillation or Cardioversion if indicated No      06/17/17 1738    Code Status History    Date Active Date Inactive Code Status Order ID Comments User Context   05/07/2017 1140 05/07/2017 1817 Partial Code [id number]  Altamese DillingVachhani, Vaibhavkumar, MD Inpatient   05/01/2017 0414 05/07/2017 1139 Full Code [id number]  Arnaldo Nataliamond, Michael S, MD Inpatient   03/06/2017 2221 03/12/2017 1838 Full Code [id number]  Oralia ManisWillis, David, MD ED   08/23/2014 1801 08/24/2014 1810 Full Code [id number]  Adrian SaranMody, Sital, MD ED    Advance Directive Documentation     Most Recent Value  Type of Advance Directive  Healthcare Power of Attorney, Living will  Pre-existing out of facility DNR order (yellow form or pink MOST form)  -  "MOST" Form in Place?  -      TOTAL TIME TAKING CARE OF THIS PATIENT: 38 minutes.    Chalmer Zheng M.D on 06/18/2017 at 1:48 PM  Between 7am to 6pm - Pager - [phone]  After 6pm go to [email or web address] - Social research officer, [email or web address] [email or web address]  [facility] [place] [facility]  Office  [phone]  CC: Primary care physician; Mick SellFitzgerald, David P, MD   Note: This dictation  was prepared with Dragon dictation along with smaller phrase technology. Any transcriptional errors that result from this process are unintentional.

## 2017-06-18 NOTE — Evaluation (Signed)
Clinical/Bedside Swallow Evaluation Patient Details  Name: Ledon SnareFrances L Saltz MRN: [id number] Date of Birth: 10/20/1924  Today's Date: 06/18/2017 Time: SLP Start Time (ACUTE ONLY): 1040 SLP Stop Time (ACUTE ONLY): 1140 SLP Time Calculation (min) (ACUTE ONLY): 60 min  Past Medical History:  Past Medical History:  Diagnosis Date  . Anxiety   . Anxiety disorder    unspecified  . Cerebral infarction (HCC)   . CVA (cerebral vascular accident) (HCC)   . Dementia   . Depression   . GERD (gastroesophageal reflux disease)   . History of recurrent UTIs   . HLD (hyperlipidemia)   . HTN (hypertension)   . Vertigo    syncope   Past Surgical History:  Past Surgical History:  Procedure Laterality Date  . CARPAL TUNNEL RELEASE Right 1995   endoscopic   . CARPAL TUNNEL RELEASE Left 04/30/2013   endoscopic , Dr. Rosita KeaMenz   . CATARACT EXTRACTION Right   . CHOLECYSTECTOMY    . ELBOW SURGERY Left   . ERCP W/ SPHINCTEROTOMY AND BALLOON DILATION  2008   and endoscopy  . ESOPHAGOGASTRODUODENOSCOPY (EGD) WITH PROPOFOL N/A 05/01/2017   Procedure: ESOPHAGOGASTRODUODENOSCOPY (EGD) WITH PROPOFOL;  Surgeon: Pasty Spillersahiliani, Varnita B, MD;  Location: [facility] ENDOSCOPY;  Service: Endoscopy;  Laterality: N/A;  . ORIF DISTAL RADIUS FRACTURE Right 2002  . TOTAL KNEE ARTHROPLASTY Left 11/23/2007  . TRIGGER FINGER RELEASE  04/30/2013   third digit, incision tendon sheath for trigger finger   HPI:  Pt is a 84 y.o. female with a known history of recurrent UTIs, Dementia, anxiety disorder, CVA, GERD, hyperlipidemia, and HTN. Pt is a [facility] [place] [facility] Pt with several recent admissions including GI bleed. Pt noted with difficulty in speech after breakfast, no complaints of any tingling or numbness in any part of the body or focal weakness.  Pt brought to the ED for difficulty in speech.  Her speech is improved in the ED, clear per MD. No facial droop. CT and MRI reveal old strokes but no  acute abnormality. Pt is alert to self and following basic commands. Per NSG, pt did not sleep well last night - she is currently lethargic and requires verbal/tactile cues to awaken and maintain attention to oral intake task. Speech is clear but drowsy-like.    Assessment / Plan / Recommendation Clinical Impression  Pt appears to present w/ grossly adequate oropharyngeal phase swallow function w/ reduced risk for aspiration when following aspiration precautions AND w/ monitoring during oral intake. However, pt presented w/ increased drowsiness during this evaluation w/ NSG reported pt did not sleep well last night, recent PT session, and baseline Dementia. Pt required full feeding assistance and monitoring w/ all po's as well. The baseline Cognitive decline and requiring assistance w/ feeding(w/ drowsiness) can increase risk for aspiration, dysphagia. Pt was positioned upright and given trials of thin liquids by Cup(could not suck on the straw sufficiently for drinking) and purees - softened solids were not assessed d/t drowsiness. Pt was able to help hold the cup to drink w/ cues given. Pt consumed trials w/ no immediate, overt s/s of aspiration noted, no decline in vocal quality or respiratory status during/post trials. Pt's oral phase was grossly wfl w/ trials of liquids and purees w/ timely transfer and swallowing/clearing noted. Pt required assistance w/ all feeding. Recommend a dysphagia level 3 (mech soft w/ meats cut, moistened foods) diet w/ Thin liquids; aspiration precautions; feeding support/monitoring at all meals. Pills WHOLE in puree  for easier, safer swallowing. Precautions posted. NSG updated; no family present. ST services will be available if any new needs while admitted.  Of note, pt's communication and speech has been clear during this admission; unsure of her baseline Cognitive-communication d/t baseline Dementia. If any changes, this would be best assessed in her baseline environment and  setting.  SLP Visit Diagnosis: Dysphagia, unspecified (R13.10)    Aspiration Risk  (reduced w/ careful monitoring and support)    Diet Recommendation  Dysphagia level 3 (mech soft w/ moistened foods); Thin liquids. Aspiration precautions; supervision at meals for support  Medication Administration: Whole meds with puree(crush or in liquid form IF needed )    Other  Recommendations Recommended Consults: (Dietician f/u) Oral Care Recommendations: Oral care BID;Staff/trained caregiver to provide oral care Other Recommendations: (n/a)   Follow up Recommendations None(TBD)      Frequency and Duration min 2x/week  1 week       Prognosis Prognosis for Safe Diet Advancement: Fair(-Good) Barriers to Reach Goals: Cognitive deficits;Severity of deficits Barriers/Prognosis Comment: unsure of PLOF      Swallow Study   General Date of Onset: 06/17/17 HPI: Pt is a 84 y.o. female with a known history of recurrent UTIs, Dementia, anxiety disorder, CVA, GERD, hyperlipidemia, and HTN. Pt is a resident of [facility] [place] [facility] - [facility] unit. Pt with several recent admissions including GI bleed. Pt noted with difficulty in speech after breakfast, no complaints of any tingling or numbness in any part of the body or focal weakness.  Pt brought to the ED for difficulty in speech.  Her speech is improved in the ED, clear per MD. No facial droop. CT and MRI reveal old strokes but no acute abnormality. Pt is alert to self and following basic commands. Per NSG, pt did not sleep well last night - she is currently lethargic and requires verbal/tactile cues to awaken and maintain attention to oral intake task. Speech is clear but drowsy-like.  Type of Study: Bedside Swallow Evaluation Previous Swallow Assessment: none reported Diet Prior to this Study: Regular;Thin liquids Temperature Spikes Noted: No(wbc 13.1) Respiratory Status: Nasal cannula(2 liters) History of Recent Intubation:  No Behavior/Cognition: Cooperative;Pleasant mood;Confused;Distractible;Requires cueing;Lethargic/Drowsy Oral Cavity Assessment: Dry Oral Care Completed by SLP: Recent completion by staff Oral Cavity - Dentition: Adequate natural dentition Vision: (drowsy) Self-Feeding Abilities: Needs assist;Needs set up;Total assist(drowsy) Patient Positioning: Upright in bed Baseline Vocal Quality: Normal;Low vocal intensity(muttered speech - drowsy-like) Volitional Cough: Cognitively unable to elicit Volitional Swallow: Unable to elicit    Oral/Motor/Sensory Function Overall Oral Motor/Sensory Function: Within functional limits(appeared in few OM movements and w/ oral clearing)   Ice Chips Ice chips: Within functional limits Presentation: Spoon(fed; 2 trials)   Thin Liquid Thin Liquid: Impaired(min decreased awareness for task of oral intake) Presentation: Cup;Self Fed;Straw(could not suck on the straw sufficiently for drinking) Oral Phase Impairments: Poor awareness of bolus(eyes closed easily;  4 trials by cup) Oral Phase Functional Implications: (none) Pharyngeal  Phase Impairments: (none)    Nectar Thick Nectar Thick Liquid: Not tested   Honey Thick Honey Thick Liquid: Not tested   Puree Puree: Within functional limits Presentation: Spoon(fed; 6 trials) Other Comments: required mod cues to awake and attend to task   Solid   GO   Solid: Not tested Other Comments: too sleepy         Jerilynn Som, MS, CCC-SLP Latic[facility] Hohmann 06/18/2017,12:54 PM

## 2017-08-05 ENCOUNTER — Emergency Department
Admission: EM | Admit: 2017-08-05 | Discharge: 2017-08-05 | Disposition: A | Discharge status: Home / Self Care | Payer: Medicare Other | Attending: Emergency Medicine | Admitting: Emergency Medicine

## 2017-08-05 ENCOUNTER — Emergency Department: Payer: Medicare Other

## 2017-08-05 ENCOUNTER — Other Ambulatory Visit: Payer: Self-pay

## 2017-08-05 DIAGNOSIS — Z23 Encounter for immunization: Secondary | ICD-10-CM | POA: Diagnosis not present

## 2017-08-05 DIAGNOSIS — Y998 Other external cause status: Secondary | ICD-10-CM | POA: Diagnosis not present

## 2017-08-05 DIAGNOSIS — Y929 Unspecified place or not applicable: Secondary | ICD-10-CM | POA: Insufficient documentation

## 2017-08-05 DIAGNOSIS — I129 Hypertensive chronic kidney disease with stage 1 through stage 4 chronic kidney disease, or unspecified chronic kidney disease: Secondary | ICD-10-CM | POA: Insufficient documentation

## 2017-08-05 DIAGNOSIS — S51011A Laceration without foreign body of right elbow, initial encounter: Secondary | ICD-10-CM | POA: Diagnosis not present

## 2017-08-05 DIAGNOSIS — Z87891 Personal history of nicotine dependence: Secondary | ICD-10-CM | POA: Insufficient documentation

## 2017-08-05 DIAGNOSIS — Z7982 Long term (current) use of aspirin: Secondary | ICD-10-CM | POA: Diagnosis not present

## 2017-08-05 DIAGNOSIS — Y9301 Activity, walking, marching and hiking: Secondary | ICD-10-CM | POA: Insufficient documentation

## 2017-08-05 DIAGNOSIS — S0003XA Contusion of scalp, initial encounter: Secondary | ICD-10-CM

## 2017-08-05 DIAGNOSIS — S0990XA Unspecified injury of head, initial encounter: Secondary | ICD-10-CM | POA: Diagnosis present

## 2017-08-05 DIAGNOSIS — Z79899 Other long term (current) drug therapy: Secondary | ICD-10-CM | POA: Diagnosis not present

## 2017-08-05 DIAGNOSIS — N183 Chronic kidney disease, stage 3 (moderate): Secondary | ICD-10-CM | POA: Diagnosis not present

## 2017-08-05 DIAGNOSIS — W19XXXA Unspecified fall, initial encounter: Secondary | ICD-10-CM | POA: Insufficient documentation

## 2017-08-05 MED ORDER — TETANUS-DIPHTH-ACELL PERTUSSIS 5-2.5-18.5 LF-MCG/0.5 IM SUSP
0.5000 mL | Freq: Once | INTRAMUSCULAR | Status: AC
  Administered 2017-08-05: 0.5 mL via INTRAMUSCULAR
  Filled 2017-08-05: qty 0.5

## 2017-08-05 NOTE — ED Notes (Signed)
Gave report to CastanaKimberly at [facility] [place].

## 2017-08-05 NOTE — ED Notes (Signed)
[facility].

## 2017-08-05 NOTE — ED Triage Notes (Signed)
Unwitnessed fall from [facility]. Skin tear to right elbow, hematoma to left posterior head. Hx dementia.

## 2017-08-05 NOTE — Discharge Instructions (Signed)
Please change the dressing on the elbow every day.  Start tomorrow.  Be careful not to pull the skin up from skin tear.  Return for any further problems nausea vomiting altered mental status or any other problems.

## 2017-08-05 NOTE — ED Provider Notes (Signed)
[facility] Emergency Department Provider Note   ____________________________________________   First MD Initiated Contact with Patient 08/05/17 1649     (approximate)  I have reviewed the triage vital signs and the nursing notes.   HISTORY  Chief Complaint Fall    HPI Rita Vang is a 84 y.o. female who reports she was walking lost her balance and fell she hit her head.  She does not think she passed out.  She complains of a lump on the back of her head that is tender but otherwise denies any pain.  She also has a skin tear on the right elbow but this is not hurting her now.  Injury happened earlier today.  Pain is mild to moderate.  Past Medical History:  Diagnosis Date  . Anxiety   . Anxiety disorder    unspecified  . Cerebral infarction (HCC)   . CVA (cerebral vascular accident) (HCC)   . Dementia   . Depression   . GERD (gastroesophageal reflux disease)   . History of recurrent UTIs   . HLD (hyperlipidemia)   . HTN (hypertension)   . Vertigo    syncope    Patient Active Problem List   Diagnosis Date Noted  . TIA (transient ischemic attack) 06/17/2017  . Palliative care by specialist   . DNR (do not resuscitate) discussion   . Lewy body dementia without behavioral disturbance   . Upper GI bleed 05/01/2017  . Acute gastrointestinal hemorrhage   . Gastric irritation   . Herpes genitalis 04/04/2017  . Protein-calorie malnutrition (HCC) 04/04/2017  . Hypomagnesemia 04/04/2017  . Vitamin B12 deficiency 04/04/2017  . Generalized weakness 04/01/2017  . Pressure injury of skin 03/07/2017  . Sepsis (HCC) 03/06/2017  . CAP (community acquired pneumonia) 03/06/2017  . HTN (hypertension) 03/06/2017  . Anxiety 03/06/2017  . HLD (hyperlipidemia) 03/06/2017  . GERD (gastroesophageal reflux disease) 03/06/2017  . CKD (chronic kidney disease), stage III (HCC) 03/06/2017  . CVA (cerebral infarction) 08/23/2014    Past Surgical History:    Procedure Laterality Date  . CARPAL TUNNEL RELEASE Right 1995   endoscopic   . CARPAL TUNNEL RELEASE Left 04/30/2013   endoscopic , Dr. Rosita Kea   . CATARACT EXTRACTION Right   . CHOLECYSTECTOMY    . ELBOW SURGERY Left   . ERCP W/ SPHINCTEROTOMY AND BALLOON DILATION  2008   and endoscopy  . ESOPHAGOGASTRODUODENOSCOPY (EGD) WITH PROPOFOL N/A 05/01/2017   Procedure: ESOPHAGOGASTRODUODENOSCOPY (EGD) WITH PROPOFOL;  Surgeon: Pasty Spillers, MD;  Location: [facility] ENDOSCOPY;  Service: Endoscopy;  Laterality: N/A;  . ORIF DISTAL RADIUS FRACTURE Right 2002  . TOTAL KNEE ARTHROPLASTY Left 11/23/2007  . TRIGGER FINGER RELEASE  04/30/2013   third digit, incision tendon sheath for trigger finger    Prior to Admission medications   Medication Sig Start Date End Date Taking? Authorizing Provider  aspirin EC 81 MG tablet Take 1 tablet (81 mg total) by mouth daily. 06/18/17 09/16/17  Enid Baas, MD  citalopram (CELEXA) 20 MG tablet Take 1 tablet by mouth daily.  12/30/13   [provider]  donepezil (ARICEPT) 10 MG tablet Take 1 tablet by mouth at bedtime.  02/02/14   [provider]  fluticasone (FLONASE) 50 MCG/ACT nasal spray Place 1 spray into both nostrils daily.  02/24/14   [provider]  galantamine (RAZADYNE) 4 MG tablet Take 4 mg by mouth 2 (two) times daily with a meal.    [provider]  ipratropium (ATROVENT  HFA) 17 MCG/ACT inhaler Inhale 2 puffs into the lungs 3 (three) times daily.    [provider]  metoprolol tartrate (LOPRESSOR) 25 MG tablet Take 1 tablet (25 mg total) by mouth 2 (two) times daily. 05/07/17   Altamese DillingVachhani, Vaibhavkumar, MD  pantoprazole (PROTONIX) 40 MG tablet Take 1 tablet (40 mg total) by mouth 2 (two) times daily before a meal. Patient taking differently: Take 40 mg by mouth daily.  05/07/17   Altamese DillingVachhani, Vaibhavkumar, MD  QUEtiapine (SEROQUEL) 25 MG tablet Take 25 mg by mouth daily.     [provider]   sennosides-docusate sodium (SENOKOT-S) 8.6-50 MG tablet Take 1 tablet by mouth daily.     [provider]  simvastatin (ZOCOR) 20 MG tablet Take 1 tablet by mouth daily. 04/22/17   [provider]  traMADol (ULTRAM) 50 MG tablet Take 50 mg by mouth 2 (two) times daily.     [provider]    Allergies Sulfamethoxazole-trimethoprim; Oxycontin [oxycodone hcl]; and Clarithromycin  Family History  Problem Relation Age of Onset  . Heart attack Father   . Alzheimer's disease Mother     Social History Social History   Tobacco Use  . Smoking status: Former Smoker    Packs/day: 1.00    Types: Cigarettes  . Smokeless tobacco: Never Used  . Tobacco comment: quit at age 84  Substance Use Topics  . Alcohol use: No  . Drug use: Not Currently    Review of Systems  Constitutional: No fever/chills Eyes: No visual changes. ENT: No sore throat. Cardiovascular: Denies chest pain. Respiratory: Denies shortness of breath. Gastrointestinal: No abdominal pain.  No nausea, no vomiting.  No diarrhea.  No constipation. Genitourinary: Negative for dysuria. Musculoskeletal: Negative for back pain. Skin: Negative for rash. Neurological: Negative for focal weakness   ____________________________________________   PHYSICAL EXAM:  VITAL SIGNS: ED Triage Vitals  Enc Vitals Group     BP 08/05/17 1651 (!) 205/84     Pulse Rate 08/05/17 1649 72     Resp 08/05/17 1649 18     Temp 08/05/17 1651 98.6 F (37 C)     Temp Source 08/05/17 1651 Oral     SpO2 08/05/17 1649 93 %     Weight 08/05/17 1654 150 lb (68 kg)     Height --      Head Circumference --      Peak Flow --      Pain Score --      Pain Loc --      Pain Edu? --      Excl. in GC? --     Constitutional: Alert and oriented. Well appearing and in no acute distress. Eyes: Conjunctivae are normal. PER. EOMI. Head: Large hematoma on the left lateral occiput.  About 3 cm in diameter and 2 cm tall. Nose: No  congestion/rhinnorhea. Mouth/Throat: Mucous membranes are moist.  Oropharynx non-erythematous. Neck: No stridor.  No cervical spine tenderness to palpation. Cardiovascular: Normal rate, regular rhythm. Grossly normal heart sounds.  Good peripheral circulation. Respiratory: Normal respiratory effort.  No retractions. Lungs CTAB. Gastrointestinal: Soft and nontender. No distention. No abdominal bruits. No CVA tenderness. Musculoskeletal: No lower extremity tenderness slight trace edema.  No joint effusions. Neurologic:  Normal speech and language. No gross focal neurologic deficits are appreciated. Skin:  Skin is warm, dry and intact. No rash noted. Psychiatric: Mood and affect are normal. Speech and behavior are normal.  ____________________________________________   LABS (all labs ordered are listed, but  only abnormal results are displayed)  Labs Reviewed - No data to display ____________________________________________  EKG   ____________________________________________  RADIOLOGY  ED MD interpretation:    Official radiology report(s): Ct Head Wo Contrast  Result Date: 08/05/2017 CLINICAL DATA:  84 year old female with unwitnessed fall. Dementia. Initial encounter. EXAM: CT HEAD WITHOUT CONTRAST CT CERVICAL SPINE WITHOUT CONTRAST TECHNIQUE: Multidetector CT imaging of the head and cervical spine was performed following the standard protocol without intravenous contrast. Multiplanar CT image reconstructions of the cervical spine were also generated. COMPARISON:  06/18/2017 brain MR. 06/17/2017 head CT. 07/12/2010 cervical spine MR. FINDINGS: CT HEAD FINDINGS Brain: No intracranial hemorrhage or CT evidence of large acute infarct. Prominent chronic microvascular changes. Remote left thalamic and right basal ganglia infarct. Global atrophy. No intracranial mass lesion noted on this unenhanced exam. Vascular: Vascular calcifications Skull: No skull fracture Sinuses/Orbits: No acute  orbital abnormality. Visualized paranasal sinuses, mastoid air cells and middle ear cavities are clear. Other: Large left parietal scalp hematoma. CT CERVICAL SPINE FINDINGS Alignment: Minimal curvature. Skull base and vertebrae: No cervical spine fracture. Soft tissues and spinal canal: No abnormal prevertebral soft tissue swelling. Disc levels: Multilevel degenerative changes. 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 Upper chest: Scarring lung apices greater on left. Other: Atherosclerotic changes carotid arteries. IMPRESSION: 1. Left parietal scalp hematoma without underlying skull fracture or intracranial hemorrhage. 2. No cervical spine fracture, malalignment or abnormal prevertebral soft tissue swelling. 3. Chronic changes as discussed above. 1. Electronically Signed   By: Lacy Duverney M.D.   On: 08/05/2017 17:46   Ct Cervical Spine Wo Contrast  Result Date: 08/05/2017 CLINICAL DATA:  84 year old female with unwitnessed fall. Dementia. Initial encounter. EXAM: CT HEAD WITHOUT CONTRAST CT CERVICAL SPINE WITHOUT CONTRAST TECHNIQUE: Multidetector CT imaging of the head and cervical spine was performed following the standard protocol without intravenous contrast. Multiplanar CT image reconstructions of the cervical spine were also generated. COMPARISON:  06/18/2017 brain MR. 06/17/2017 head CT. 07/12/2010 cervical spine MR. FINDINGS: CT HEAD FINDINGS Brain: No intracranial hemorrhage or CT evidence of large acute infarct. Prominent chronic microvascular changes. Remote left thalamic and right basal ganglia infarct. Global atrophy. No intracranial mass lesion noted on this unenhanced exam. Vascular: Vascular calcifications Skull: No skull fracture Sinuses/Orbits: No acute orbital abnormality. Visualized paranasal sinuses, mastoid air cells and middle ear cavities are clear. Other: Large left parietal scalp hematoma. CT CERVICAL SPINE FINDINGS Alignment: Minimal curvature. Skull base and vertebrae: No cervical spine fracture.  Soft tissues and spinal canal: No abnormal prevertebral soft tissue swelling. Disc levels: Multilevel degenerative changes. 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 Upper chest: Scarring lung apices greater on left. Other: Atherosclerotic changes carotid arteries. IMPRESSION: 1. Left parietal scalp hematoma without underlying skull fracture or intracranial hemorrhage. 2. No cervical spine fracture, malalignment or abnormal prevertebral soft tissue swelling. 3. Chronic changes as discussed above. 1. Electronically Signed   By: Lacy Duverney M.D.   On: 08/05/2017 17:46    ____________________________________________   PROCEDURES  Procedure(s) performed:   Procedures  Critical Care performed:   ____________________________________________   INITIAL IMPRESSION / ASSESSMENT AND PLAN / ED COURSE  CTs are negative.  Patient looks well feels well.  Wound was redressed.  We will let her go home.      ____________________________________________   FINAL CLINICAL IMPRESSION(S) / ED DIAGNOSES  Final diagnoses:  Fall, initial encounter  Contusion of scalp, initial encounter  Skin tear of right elbow without complication, initial encounter     ED Discharge Orders    None  Note:  This document was prepared using Dragon voice recognition software and may include unintentional dictation errors.    Arnaldo Natal, MD 08/05/17 [phone]

## 2017-08-05 NOTE — ED Notes (Signed)
Unable to esign for discharge. Discussed discharge instructions with Cala BradfordKimberly at [facility].

## 2017-08-05 NOTE — ED Notes (Signed)
Pt to CT at this time.

## 2017-11-02 ENCOUNTER — Emergency Department: Payer: Medicare Other

## 2017-11-02 ENCOUNTER — Other Ambulatory Visit: Payer: Self-pay

## 2017-11-02 ENCOUNTER — Emergency Department
Admission: EM | Admit: 2017-11-02 | Discharge: 2017-11-02 | Disposition: A | Discharge status: Skilled Nursing Facility | Payer: Medicare Other | Attending: Emergency Medicine | Admitting: Emergency Medicine

## 2017-11-02 DIAGNOSIS — W19XXXA Unspecified fall, initial encounter: Secondary | ICD-10-CM | POA: Insufficient documentation

## 2017-11-02 DIAGNOSIS — S61512A Laceration without foreign body of left wrist, initial encounter: Secondary | ICD-10-CM | POA: Diagnosis not present

## 2017-11-02 DIAGNOSIS — S61412A Laceration without foreign body of left hand, initial encounter: Secondary | ICD-10-CM

## 2017-11-02 DIAGNOSIS — Y999 Unspecified external cause status: Secondary | ICD-10-CM | POA: Insufficient documentation

## 2017-11-02 DIAGNOSIS — Z7982 Long term (current) use of aspirin: Secondary | ICD-10-CM | POA: Diagnosis not present

## 2017-11-02 DIAGNOSIS — S6992XA Unspecified injury of left wrist, hand and finger(s), initial encounter: Secondary | ICD-10-CM | POA: Diagnosis present

## 2017-11-02 DIAGNOSIS — X58XXXA Exposure to other specified factors, initial encounter: Secondary | ICD-10-CM | POA: Insufficient documentation

## 2017-11-02 DIAGNOSIS — F0390 Unspecified dementia without behavioral disturbance: Secondary | ICD-10-CM | POA: Insufficient documentation

## 2017-11-02 DIAGNOSIS — Y92129 Unspecified place in nursing home as the place of occurrence of the external cause: Secondary | ICD-10-CM | POA: Diagnosis not present

## 2017-11-02 DIAGNOSIS — Y9301 Activity, walking, marching and hiking: Secondary | ICD-10-CM | POA: Insufficient documentation

## 2017-11-02 DIAGNOSIS — S61412D Laceration without foreign body of left hand, subsequent encounter: Secondary | ICD-10-CM | POA: Insufficient documentation

## 2017-11-02 LAB — CBC WITH DIFFERENTIAL/PLATELET
ABS IMMATURE GRANULOCYTES: 0.09 10*3/uL — AB (ref 0.00–0.07)
BASOS ABS: 0 10*3/uL (ref 0.0–0.1)
BASOS PCT: 0 %
Eosinophils Absolute: 0.1 10*3/uL (ref 0.0–0.5)
Eosinophils Relative: 1 %
HCT: 29.2 % — ABNORMAL LOW (ref 36.0–46.0)
HEMOGLOBIN: 8.7 g/dL — AB (ref 12.0–15.0)
Immature Granulocytes: 1 %
LYMPHS PCT: 13 %
Lymphs Abs: 1.9 10*3/uL (ref 0.7–4.0)
MCH: 27.9 pg (ref 26.0–34.0)
MCHC: 29.8 g/dL — ABNORMAL LOW (ref 30.0–36.0)
MCV: 93.6 fL (ref 80.0–100.0)
Monocytes Absolute: 1 10*3/uL (ref 0.1–1.0)
Monocytes Relative: 7 %
NEUTROS ABS: 11.6 10*3/uL — AB (ref 1.7–7.7)
NRBC: 0 % (ref 0.0–0.2)
Neutrophils Relative %: 78 %
PLATELETS: 176 10*3/uL (ref 150–400)
RBC: 3.12 MIL/uL — AB (ref 3.87–5.11)
RDW: 14.7 % (ref 11.5–15.5)
WBC: 14.8 10*3/uL — AB (ref 4.0–10.5)

## 2017-11-02 LAB — CBC
HEMATOCRIT: 28.1 % — AB (ref 36.0–46.0)
Hemoglobin: 8.5 g/dL — ABNORMAL LOW (ref 12.0–15.0)
MCH: 28 pg (ref 26.0–34.0)
MCHC: 30.2 g/dL (ref 30.0–36.0)
MCV: 92.4 fL (ref 80.0–100.0)
NRBC: 0 % (ref 0.0–0.2)
PLATELETS: 183 10*3/uL (ref 150–400)
RBC: 3.04 MIL/uL — ABNORMAL LOW (ref 3.87–5.11)
RDW: 14.7 % (ref 11.5–15.5)
WBC: 15 10*3/uL — ABNORMAL HIGH (ref 4.0–10.5)

## 2017-11-02 LAB — BASIC METABOLIC PANEL
Anion gap: 6 (ref 5–15)
BUN: 24 mg/dL — ABNORMAL HIGH (ref 8–23)
CHLORIDE: 103 mmol/L (ref 98–111)
CO2: 28 mmol/L (ref 22–32)
CREATININE: 1.06 mg/dL — AB (ref 0.44–1.00)
Calcium: 8.1 mg/dL — ABNORMAL LOW (ref 8.9–10.3)
GFR, EST AFRICAN AMERICAN: 52 mL/min — AB (ref >60)
GFR, EST NON AFRICAN AMERICAN: 45 mL/min — AB (ref >60)
Glucose, Bld: 110 mg/dL — ABNORMAL HIGH (ref 70–99)
POTASSIUM: 3.8 mmol/L (ref 3.5–5.1)
SODIUM: 137 mmol/L (ref 135–145)

## 2017-11-02 LAB — TYPE AND SCREEN
ABO/RH(D): O NEG
ANTIBODY SCREEN: NEGATIVE

## 2017-11-02 MED ORDER — ONDANSETRON HCL 4 MG/2ML IJ SOLN
4.0000 mg | Freq: Once | INTRAMUSCULAR | Status: AC
  Administered 2017-11-02: 4 mg via INTRAVENOUS
  Filled 2017-11-02: qty 2

## 2017-11-02 MED ORDER — MORPHINE SULFATE (PF) 2 MG/ML IV SOLN
2.0000 mg | Freq: Once | INTRAVENOUS | Status: AC
  Administered 2017-11-02: 2 mg via INTRAVENOUS
  Filled 2017-11-02: qty 1

## 2017-11-02 MED ORDER — CEPHALEXIN 500 MG PO CAPS: 500.0000 mg | ORAL_CAPSULE | Freq: Four times a day (QID) | ORAL | 0 refills | Status: AC

## 2017-11-02 MED ORDER — CEPHALEXIN 500 MG PO CAPS
500.0000 mg | ORAL_CAPSULE | Freq: Once | ORAL | Status: AC
  Administered 2017-11-02: 500 mg via ORAL
  Filled 2017-11-02: qty 1

## 2017-11-02 NOTE — ED Notes (Signed)
Report to the [facility]

## 2017-11-02 NOTE — ED Provider Notes (Signed)
[facility] Emergency Department Provider Note    First MD Initiated Contact with Patient 11/02/17 [phone]     (approximate)  I have reviewed the triage vital signs and the nursing notes.  Level 5 caveat history and physical exam limited secondary to dementia HISTORY  Chief Complaint Fall    HPI Rita Vang is a 84 y.o. female presents to the emergency department via EMS from the [facility] facility following accidental fall with resultant skin tear to the left forearm.  Patient denies any complaints.  EMS states that the staff at the [facility] stated patient attempted to ambulate without any assistance resulting in a fall.   Past medical history Dementia There are no active problems to display for this patient.     Prior to Admission medications   Not on File    Allergies Allergies No family history on file.  Social History Social History   Tobacco Use  . Smoking status: Not on file  Substance Use Topics  . Alcohol use: Not on file  . Drug use: Not on file    Review of Systems Constitutional: No fever/chills Eyes: No visual changes. ENT: No sore throat. Cardiovascular: Denies chest pain. Respiratory: Denies shortness of breath. Gastrointestinal: No abdominal pain.  No nausea, no vomiting.  No diarrhea.  No constipation. Genitourinary: Negative for dysuria. Musculoskeletal: Negative for neck pain.  Negative for back pain. Integumentary: Negative for rash. Neurological: Negative for headaches, focal weakness or numbness.   ____________________________________________   PHYSICAL EXAM:  VITAL SIGNS: ED Triage Vitals [11/02/17 0054]  Enc Vitals Group     BP (!) 150/81     Pulse Rate 63     Resp 14     Temp 97.7 F (36.5 C)     Temp Source Oral     SpO2 96 %     Weight 63.5 kg (140 lb)     Height 1.6 m (5\' 3" )     Head Circumference      Peak Flow      Pain Score 0     Pain Loc      Pain Edu?      Excl. in GC?      Constitutional: Alert and Well appearing and in no acute distress. Eyes: Conjunctivae are normal.  Head: Atraumatic. Mouth/Throat: Mucous membranes are moist.  Oropharynx non-erythematous. Neck: No stridor.  No meningeal signs.  No cervical spine tenderness to palpation. Cardiovascular: Normal rate, regular rhythm. Good peripheral circulation. Grossly normal heart sounds. Respiratory: Normal respiratory effort.  No retractions. Lungs CTAB. Gastrointestinal: Soft and nontender. No distention.  Musculoskeletal: No lower extremity tenderness nor edema. No gross deformities of extremities. Neurologic:  Normal speech and language. No gross focal neurologic deficits are appreciated.  Skin: 12 inch x 3 inch skin tear to the dorsal aspect of the left wrist/distal forearm     RADIOLOGY I, [place] Trinh Sanjose, personally viewed and evaluated these images (plain radiographs) as part of my medical decision making, as well as reviewing the written report by the radiologist.  ED MD interpretation: Degenerative findings of the wrists and left hand.  Soft tissue findings compatible with laceration.  Official radiology report(s): Dg Wrist Complete Left  Result Date: 11/02/2017 CLINICAL DATA:  Unwitnessed fall with skin tear along the left forearm. EXAM: LEFT WRIST - COMPLETE 3+ VIEW COMPARISON:  None. FINDINGS: There is osteoarthritic joint space narrowing of the included DIP and PIP joints the fifth digit, more uniform joint space narrowing of  the metacarpophalangeal joints of the left hand, interphalangeal joint of the thumb, first CMC and triscaphe articulations of the wrist. No acute fracture or malalignment. Slight ulnar positive variance is noted with hypodensity involving the lunate likely representing ulnar abutment. Irregularity of the soft tissues along the dorsal and radial aspect of the wrist are identified in keeping with clinical history of soft tissue injury/laceration with overlying  bandaging noted. No radiopaque foreign body. IMPRESSION: 1. Degenerative changes of the wrist and hand as above. 2. Ulnar positive variance with cystic change of the lunate compatible with ulnar abutment. 3. Deformity along the dorsal and radial soft tissues of the distal forearm compatible with history of soft tissue laceration with overlying bandaging. No radiopaque foreign body. Electronically Signed   By: Tollie Eth M.D.   On: 11/02/2017 01:33   Ct Head Wo Contrast  Result Date: 11/02/2017 CLINICAL DATA:  Unwitnessed fall with skin tear of the left forearm. EXAM: CT HEAD WITHOUT CONTRAST TECHNIQUE: Contiguous axial images were obtained from the base of the skull through the vertex without intravenous contrast. COMPARISON:  None. FINDINGS: Brain: Age related involutional changes the brain with sulcal and ventricular prominence. Moderate degree of small vessel ischemia likely chronic is noted in the periventricular deep white matter. Bilateral basal ganglial and left thalamic lacunar infarcts likely chronic are also noted. The brainstem and cerebellum are nonacute. No intra-axial mass nor extra-axial fluid. Vascular: No hyperdense vessel sign. There is atherosclerosis of the carotid siphons. Skull: No acute skull fracture or suspicious osseous lesions. Sinuses/Orbits: Lens replacements. Intact orbits and globes. Mild ethmoid sinus mucosal thickening. Clear mastoids. Other: None IMPRESSION: Atrophy with chronic appearing small vessel ischemia. No acute intracranial abnormality. Electronically Signed   By: Tollie Eth M.D.   On: 11/02/2017 02:13      Procedures   ____________________________________________   INITIAL IMPRESSION / ASSESSMENT AND PLAN / ED COURSE  As part of my medical decision making, I reviewed the following data within the electronic MEDICAL RECORD NUMBER   84 year old female presenting with above-stated history and physical exam following fall with the patient stood without  assistance per EMS.  CT scan of the head revealed no acute findings left wrist x-ray consistent with soft tissue findings which are noted above.  Patient's skin is very thin and as such suture repair is not possible.  As such skin tear dressing applied to the patient's wound.  Spoke with the patient's son regarding all clinical findings. ____________________________________________  FINAL CLINICAL IMPRESSION(S) / ED DIAGNOSES  Final diagnoses:  Tear of skin of left wrist, initial encounter     MEDICATIONS GIVEN DURING THIS VISIT:  Medications - No data to display   ED Discharge Orders    None       Note:  This document was prepared using Dragon voice recognition software and may include unintentional dictation errors.    Darci Current, MD 11/02/17 [phone]

## 2017-11-02 NOTE — ED Triage Notes (Signed)
Pt from the [facility] with unwitnessed fall. Pt with skin tear noted to left forearm approx 12 inches and 3 inches wide. Pt has no complaints. Alert to self only.

## 2017-11-02 NOTE — ED Provider Notes (Addendum)
[facility] Emergency Department Provider Note  ___________________________________________   First MD Initiated Contact with Patient 11/02/17 1457     (approximate)  I have reviewed the triage vital signs and the nursing notes.   HISTORY  Chief Complaint No chief complaint on file.   HPI Rita Vang is a 84 y.o. female is presenting back to the emergency department today with a left hand skin tear/laceration.  Sustained last night.  Evaluated in this emergency department and found to have no internal injuries on x-ray and CAT scan.  However, after being discharged she had persistent bleeding through her bandages soaking through multiple bandages.  [name] on aspirin only for blood thinners.  Fire department recorded a blood pressure of 60 upon arrival.  Patient without any complaints at this time but with history of dementia.   History reviewed. No pertinent past medical history.  There are no active problems to display for this patient.   Prior to Admission medications   Medication Sig Start Date End Date Taking? Authorizing Provider  SF 5000 PLUS 1.1 % CREA dental cream Take 1 application by mouth as directed. 10/30/17  Yes [provider]    Allergies Patient has no known allergies.  History reviewed. No pertinent family history.  Social History Social History   Tobacco Use  . Smoking status: Never Smoker  . Smokeless tobacco: Never Used  Substance Use Topics  . Alcohol use: Never    Frequency: Never  . Drug use: Not Currently    Review of Systems  Constitutional: No fever/chills Eyes: No visual changes. ENT: No sore throat. Cardiovascular: Denies chest pain. Respiratory: Denies shortness of breath. Gastrointestinal: No abdominal pain.  No nausea, no vomiting.  No diarrhea.  No constipation. Genitourinary: Negative for dysuria. Musculoskeletal: Negative for back pain. Skin: Negative for rash. Neurological: Negative  for headaches, focal weakness or numbness.   ____________________________________________   PHYSICAL EXAM:  VITAL SIGNS: ED Triage Vitals [11/02/17 1500]  Enc Vitals Group     BP (!) 131/59     Pulse      Resp 15     Temp      Temp src      SpO2      Weight      Height      Head Circumference      Peak Flow      Pain Score      Pain Loc      Pain Edu?      Excl. in GC?     Constitutional: Alert and oriented to self only. Eyes: Conjunctivae are normal.  Head: Atraumatic. Nose: No congestion/rhinnorhea. Mouth/Throat: Mucous membranes are moist.  Neck: No stridor.   Cardiovascular: Normal rate, regular rhythm. Grossly normal heart sounds.   Respiratory: Normal respiratory effort.  No retractions. Lungs CTAB. Gastrointestinal: Soft and nontender. No distention.  Musculoskeletal: No lower extremity tenderness nor edema.  No joint effusions.  Left wrist and hand with a dorsal laceration/skin tear approximately 12 x 3 inches as recorded in the note from earlier today.  2 areas of nonpulsatile but active bleeding at the proximal and distal aspects.  Neurovascular intact otherwise.  Patient also with dorsal swelling to the hand and ecchymosis.  Neurovascularly intact distal to the site of the injury.  Neurologic:  Normal speech and language. No gross focal neurologic deficits are appreciated. Skin:  Skin is warm, dry and intact. No rash noted. Psychiatric: Mood and affect are normal. Speech and  behavior are normal.  ____________________________________________   LABS (all labs ordered are listed, but only abnormal results are displayed)  Labs Reviewed  CBC WITH DIFFERENTIAL/PLATELET - Abnormal; Notable for the following components:      Result Value   WBC 14.8 (*)    RBC 3.12 (*)    Hemoglobin 8.7 (*)    HCT 29.2 (*)    MCHC 29.8 (*)    Neutro Abs 11.6 (*)    Abs Immature Granulocytes 0.09 (*)    All other components within normal limits  BASIC METABOLIC PANEL -  Abnormal; Notable for the following components:   Glucose, Bld 110 (*)    BUN 24 (*)    Creatinine, Ser 1.06 (*)    Calcium 8.1 (*)    GFR calc non Af Amer 45 (*)    GFR calc Af Amer 52 (*)    All other components within normal limits  CBC - Abnormal; Notable for the following components:   WBC 15.0 (*)    RBC 3.04 (*)    Hemoglobin 8.5 (*)    HCT 28.1 (*)    All other components within normal limits  TYPE AND SCREEN   ____________________________________________  EKG   ____________________________________________  RADIOLOGY   ____________________________________________   PROCEDURES  Procedure(s) performed:    Marland KitchenMarland KitchenLaceration Repair Date/Time: 11/02/2017 4:16 PM Performed by: Myrna Blazer, MD Authorized by: Myrna Blazer, MD   Consent:    Consent obtained:  Verbal   Consent given by:  Patient   Risks discussed:  Infection, pain, retained foreign body, poor cosmetic result and poor wound healing Anesthesia (see MAR for exact dosages):    Anesthesia method:  Local infiltration   Local anesthetic:  Lidocaine 1% WITH epi Laceration details:    Location:  Hand   Hand location:  L hand, dorsum   Wound length (cm): 12in.   Laceration depth: 3. Repair type:    Repair type:  Simple Exploration:    Hemostasis obtained with: Unable to achieve hemostasis with Surgicel and pressure.   Wound exploration: entire depth of wound probed and visualized     Contaminated: no   Treatment:    Area cleansed with:  Saline   Amount of cleaning:  Extensive   Irrigation solution:  Sterile saline   Visualized foreign bodies/material removed: no   Skin repair:    Repair method:  Sutures   Suture size:  4-0   Suture material:  Nylon   Suture technique:  Horizontal mattress   Number of sutures:  2 Approximation:    Approximation:  Close Post-procedure details:    Dressing:  Sterile dressing   Patient tolerance of procedure:  Tolerated well, no immediate  complications    Critical Care performed:   ____________________________________________   INITIAL IMPRESSION / ASSESSMENT AND PLAN / ED COURSE  Pertinent labs & imaging results that were available during my care of the patient were reviewed by me and considered in my medical decision making (see chart for details).  DDX: Arterial bleeding, venous bleeding, uncontrolled hemorrhage, symptomatic anemia, skin tear, laceration As part of my medical decision making, I reviewed the following data within the electronic MEDICAL RECORD NUMBER Notes from prior ED visits  ----------------------------------------- 4:20 PM on 11/02/2017 -----------------------------------------  Patient at this time with hemostasis achieved after 2 horizontal mattress sutures used to cinch down areas of bleeding.  Approximately 16 hours after initial injury.  Hemoglobin of 8.7.  Patient being registered under different MRN.  Able to  review past hemoglobins on patient's other MRN and last hemoglobin was 10.1 on June 17, 2017.  We will repeat the hemoglobin at 4 hours since hemostais has been achieved at this time.  ----------------------------------------- 8:51 PM on 11/02/2017 -----------------------------------------  Patient with hemoglobin went from 8.7 to 8.5.  Vital signs are reassuring.  Patient to be discharged at this time.  Neurovascularly intact distal to the bandage.  Fingers lightly swollen ecchymotic but I would expect this given the level of hematoma involved.  Brisk capillary refill to the nailbeds as well on the left.  Patient be discharged at this time.  We will also give prophylactic antibiotics since the laceration was sutured approximately 16 hours after the initial injury.  Patient has an appointment with her primary care doctor in 8 days.  She will have the sutures out at that time.  Discussed with patient as well as son who is at the bedside.  They are understanding and willing to comply.  Son reports  tetanus shot within the past 10 years for the patient. ____________________________________________   FINAL CLINICAL IMPRESSION(S) / ED DIAGNOSES  Laceration.  NEW MEDICATIONS STARTED DURING THIS VISIT:  New Prescriptions   No medications on file     Note:  This document was prepared using Dragon voice recognition software and may include unintentional dictation errors.     Myrna Blazer, MD 11/02/17 2053    Myrna Blazer, MD 11/02/17 [phone]

## 2017-11-02 NOTE — ED Notes (Signed)
Patient transported to CT 

## 2017-11-02 NOTE — ED Notes (Signed)
Pt arrives via [facility] from nursing facility, unable to achieve hemostasis on skin tear. Pt hx dementia, poor historian.

## 2017-11-02 NOTE — ED Notes (Signed)
Pt's family at bedside. Family states pt is to be on oxygen at 2lpm at night. Pt placed on oxygen at 2lpm via [place].

## 2017-11-02 NOTE — ED Notes (Signed)
Dr. Brown at bedside

## 2017-11-02 NOTE — ED Notes (Signed)
Skin tear dressed with xeroform dressing with sterile gauze and pressure dressing applied over.

## 2017-11-02 NOTE — ED Notes (Signed)
Pt regularly rounded on during shift. Pt son bedside. Pt has been asleep VSS since medication admin.

## 2017-11-02 NOTE — ED Notes (Signed)
Pt from the [facility] [place].

## 2017-11-03 ENCOUNTER — Encounter: Payer: Self-pay | Admitting: Emergency Medicine

## 2017-11-15 ENCOUNTER — Other Ambulatory Visit: Payer: Self-pay

## 2017-11-15 ENCOUNTER — Emergency Department
Admission: EM | Admit: 2017-11-15 | Discharge: 2017-11-15 | Disposition: A | Discharge status: Skilled Nursing Facility | Payer: Medicare Other | Attending: Emergency Medicine | Admitting: Emergency Medicine

## 2017-11-15 ENCOUNTER — Encounter: Payer: Self-pay | Admitting: Emergency Medicine

## 2017-11-15 DIAGNOSIS — W19XXXA Unspecified fall, initial encounter: Secondary | ICD-10-CM | POA: Diagnosis not present

## 2017-11-15 DIAGNOSIS — F0390 Unspecified dementia without behavioral disturbance: Secondary | ICD-10-CM | POA: Diagnosis not present

## 2017-11-15 DIAGNOSIS — Y939 Activity, unspecified: Secondary | ICD-10-CM | POA: Diagnosis not present

## 2017-11-15 DIAGNOSIS — I129 Hypertensive chronic kidney disease with stage 1 through stage 4 chronic kidney disease, or unspecified chronic kidney disease: Secondary | ICD-10-CM | POA: Diagnosis not present

## 2017-11-15 DIAGNOSIS — S59901A Unspecified injury of right elbow, initial encounter: Secondary | ICD-10-CM | POA: Diagnosis present

## 2017-11-15 DIAGNOSIS — S41111A Laceration without foreign body of right upper arm, initial encounter: Secondary | ICD-10-CM

## 2017-11-15 DIAGNOSIS — S51011A Laceration without foreign body of right elbow, initial encounter: Secondary | ICD-10-CM | POA: Diagnosis not present

## 2017-11-15 DIAGNOSIS — Z96652 Presence of left artificial knee joint: Secondary | ICD-10-CM | POA: Insufficient documentation

## 2017-11-15 DIAGNOSIS — Y92129 Unspecified place in nursing home as the place of occurrence of the external cause: Secondary | ICD-10-CM | POA: Diagnosis not present

## 2017-11-15 DIAGNOSIS — N183 Chronic kidney disease, stage 3 (moderate): Secondary | ICD-10-CM | POA: Insufficient documentation

## 2017-11-15 DIAGNOSIS — Y999 Unspecified external cause status: Secondary | ICD-10-CM | POA: Insufficient documentation

## 2017-11-15 MED ORDER — TRAMADOL HCL 50 MG PO TABS
50.0000 mg | ORAL_TABLET | Freq: Once | ORAL | Status: AC
  Administered 2017-11-15: 50 mg via ORAL

## 2017-11-15 MED ORDER — TRAMADOL HCL 50 MG PO TABS
ORAL_TABLET | ORAL | Status: AC
  Administered 2017-11-15: 50 mg via ORAL
  Filled 2017-11-15: qty 1

## 2017-11-15 NOTE — ED Provider Notes (Signed)
[facility] Emergency Department Provider Note       Time seen: ----------------------------------------- 10:39 PM on 11/15/2017 -----------------------------------------   I have reviewed the triage vital signs and the nursing notes.  HISTORY   Chief Complaint Laceration    HPI Rita Vang is a 84 y.o. female with a history of anxiety, CVA, dementia, GERD, hyperlipidemia, hypertension who presents to the ED for a skin tear noted to the right elbow.  Patient arrives from the [facility] after injuring the right elbow but denies any other complaints.  Past Medical History:  Diagnosis Date  . Anxiety   . Anxiety disorder    unspecified  . Cerebral infarction (HCC)   . CVA (cerebral vascular accident) (HCC)   . Dementia (HCC)   . Depression   . GERD (gastroesophageal reflux disease)   . History of recurrent UTIs   . HLD (hyperlipidemia)   . HTN (hypertension)   . Vertigo    syncope    Patient Active Problem List   Diagnosis Date Noted  . TIA (transient ischemic attack) 06/17/2017  . Palliative care by specialist   . DNR (do not resuscitate) discussion   . Lewy body dementia without behavioral disturbance (HCC)   . Upper GI bleed 05/01/2017  . Acute gastrointestinal hemorrhage   . Gastric irritation   . Herpes genitalis 04/04/2017  . Protein-calorie malnutrition (HCC) 04/04/2017  . Hypomagnesemia 04/04/2017  . Vitamin B12 deficiency 04/04/2017  . Generalized weakness 04/01/2017  . Pressure injury of skin 03/07/2017  . Sepsis (HCC) 03/06/2017  . CAP (community acquired pneumonia) 03/06/2017  . HTN (hypertension) 03/06/2017  . Anxiety 03/06/2017  . HLD (hyperlipidemia) 03/06/2017  . GERD (gastroesophageal reflux disease) 03/06/2017  . CKD (chronic kidney disease), stage III (HCC) 03/06/2017  . CVA (cerebral infarction) 08/23/2014    Past Surgical History:  Procedure Laterality Date  . CARPAL TUNNEL RELEASE Right 1995   endoscopic   .  CARPAL TUNNEL RELEASE Left 04/30/2013   endoscopic , Dr. Rosita Kea   . CATARACT EXTRACTION Right   . CHOLECYSTECTOMY    . ELBOW SURGERY Left   . ERCP W/ SPHINCTEROTOMY AND BALLOON DILATION  2008   and endoscopy  . ESOPHAGOGASTRODUODENOSCOPY (EGD) WITH PROPOFOL N/A 05/01/2017   Procedure: ESOPHAGOGASTRODUODENOSCOPY (EGD) WITH PROPOFOL;  Surgeon: Pasty Spillers, MD;  Location: [facility] ENDOSCOPY;  Service: Endoscopy;  Laterality: N/A;  . ORIF DISTAL RADIUS FRACTURE Right 2002  . TOTAL KNEE ARTHROPLASTY Left 11/23/2007  . TRIGGER FINGER RELEASE  04/30/2013   third digit, incision tendon sheath for trigger finger    Allergies Sulfamethoxazole-trimethoprim; Oxycontin [oxycodone hcl]; and Clarithromycin  Social History Social History   Tobacco Use  . Smoking status: Never Smoker  . Smokeless tobacco: Never Used  . Tobacco comment: quit at age 25  Substance Use Topics  . Alcohol use: Never    Frequency: Never  . Drug use: Not Currently   Review of Systems Constitutional: Negative for fever. Cardiovascular: Negative for chest pain. Respiratory: Negative for shortness of breath. Musculoskeletal: Negative for arm pain Skin: Positive for laceration of the right elbow Neurological: Negative for headaches, focal weakness or numbness.  All systems negative/normal/unremarkable except as stated in the HPI  ____________________________________________   PHYSICAL EXAM:  VITAL SIGNS: ED Triage Vitals  Enc Vitals Group     BP 11/15/17 2231 121/69     Pulse Rate 11/15/17 2231 72     Resp 11/15/17 2231 (!) 22     Temp 11/15/17 2231 98  F (36.7 C)     Temp Source 11/15/17 2231 Oral     SpO2 11/15/17 2231 99 %     Weight 11/15/17 2232 139 lb 15.9 oz (63.5 kg)     Height 11/15/17 2232 5\' 3"  (1.6 m)     Head Circumference --      Peak Flow --      Pain Score 11/15/17 2232 0     Pain Loc --      Pain Edu? --      Excl. in GC? --    Constitutional: Alert but disoriented.  No acute  distress Musculoskeletal: Unremarkable range of motion of the upper extremity, extensive laceration and skin tear noted over the right elbow region. Neurologic:  Normal speech and language. No gross focal neurologic deficits are appreciated.  Skin: Extensive, approximately 20 cm maceration and skin tear with mild bleeding Psychiatric: Mood and affect are normal. ____________________________________________  ED COURSE:  As part of my medical decision making, I reviewed the following data within the electronic MEDICAL RECORD NUMBER History obtained from family if available, nursing notes, old chart and ekg, as well as notes from prior ED visits. Patient presented for extensive skin tear and superficial laceration over the right elbow, she will require extensive laceration repair   .Marland KitchenLaceration Repair Date/Time: 11/15/2017 10:41 PM Performed by: Emily Filbert, MD Authorized by: Emily Filbert, MD   Consent:    Consent obtained:  Verbal   Consent given by:  Patient Anesthesia (see MAR for exact dosages):    Anesthesia method:  None Laceration details:    Location:  Shoulder/arm   Shoulder/arm location:  R elbow   Length (cm):  20   Depth (mm):  3 Repair type:    Repair type:  Intermediate Pre-procedure details:    Preparation:  Patient was prepped and draped in usual sterile fashion Treatment:    Area cleansed with:  Saline   Amount of cleaning:  Standard   Irrigation solution:  Sterile saline Skin repair:    Repair method:  Tissue adhesive Approximation:    Approximation:  Loose Post-procedure details:    Dressing:  Non-adherent dressing   Patient tolerance of procedure:  Tolerated well, no immediate complications    ____________________________________________  DIFFERENTIAL DIAGNOSIS   Laceration, contusion, skin tear  FINAL ASSESSMENT AND PLAN  Skin tear   Plan: The patient had presented for extensive skin tear of the right elbow region.  Wound was  reapproximated as well as possible using Dermabond with fair cosmetic result.  She tolerated this well, no complication.   Ulice Dash, MD   Note: This note was generated in part or whole with voice recognition software. Voice recognition is usually quite accurate but there are transcription errors that can and very often do occur. I apologize for any typographical errors that were not detected and corrected.     Emily Filbert, MD 11/15/17 [phone]

## 2017-11-15 NOTE — ED Triage Notes (Signed)
Pt arrives via [facility] from [facility] with c/o skin tear on right elbow. Pt is in NAD.

## 2018-01-24 ENCOUNTER — Inpatient Hospital Stay
Admission: EM | Admit: 2018-01-24 | Discharge: 2018-01-31 | DRG: 871 | Disposition: A | Discharge status: Skilled Nursing Facility | Payer: Medicare Other | Attending: Internal Medicine | Admitting: Internal Medicine

## 2018-01-24 ENCOUNTER — Encounter: Payer: Self-pay | Admitting: Emergency Medicine

## 2018-01-24 ENCOUNTER — Emergency Department: Payer: Medicare Other

## 2018-01-24 DIAGNOSIS — A419 Sepsis, unspecified organism: Secondary | ICD-10-CM | POA: Diagnosis present

## 2018-01-24 DIAGNOSIS — J189 Pneumonia, unspecified organism: Secondary | ICD-10-CM | POA: Diagnosis present

## 2018-01-24 DIAGNOSIS — I5033 Acute on chronic diastolic (congestive) heart failure: Secondary | ICD-10-CM | POA: Diagnosis present

## 2018-01-24 DIAGNOSIS — F419 Anxiety disorder, unspecified: Secondary | ICD-10-CM | POA: Diagnosis present

## 2018-01-24 DIAGNOSIS — Z8673 Personal history of transient ischemic attack (TIA), and cerebral infarction without residual deficits: Secondary | ICD-10-CM

## 2018-01-24 DIAGNOSIS — E785 Hyperlipidemia, unspecified: Secondary | ICD-10-CM | POA: Diagnosis present

## 2018-01-24 DIAGNOSIS — R296 Repeated falls: Secondary | ICD-10-CM | POA: Diagnosis present

## 2018-01-24 DIAGNOSIS — Z9049 Acquired absence of other specified parts of digestive tract: Secondary | ICD-10-CM | POA: Diagnosis not present

## 2018-01-24 DIAGNOSIS — Z9841 Cataract extraction status, right eye: Secondary | ICD-10-CM | POA: Diagnosis not present

## 2018-01-24 DIAGNOSIS — Z96652 Presence of left artificial knee joint: Secondary | ICD-10-CM | POA: Diagnosis present

## 2018-01-24 DIAGNOSIS — Z881 Allergy status to other antibiotic agents status: Secondary | ICD-10-CM

## 2018-01-24 DIAGNOSIS — Z9181 History of falling: Secondary | ICD-10-CM | POA: Diagnosis not present

## 2018-01-24 DIAGNOSIS — Z885 Allergy status to narcotic agent status: Secondary | ICD-10-CM

## 2018-01-24 DIAGNOSIS — Z66 Do not resuscitate: Secondary | ICD-10-CM | POA: Diagnosis present

## 2018-01-24 DIAGNOSIS — G3183 Dementia with Lewy bodies: Secondary | ICD-10-CM | POA: Diagnosis present

## 2018-01-24 DIAGNOSIS — I130 Hypertensive heart and chronic kidney disease with heart failure and stage 1 through stage 4 chronic kidney disease, or unspecified chronic kidney disease: Secondary | ICD-10-CM | POA: Diagnosis present

## 2018-01-24 DIAGNOSIS — Z87440 Personal history of urinary (tract) infections: Secondary | ICD-10-CM | POA: Diagnosis not present

## 2018-01-24 DIAGNOSIS — Z79891 Long term (current) use of opiate analgesic: Secondary | ICD-10-CM

## 2018-01-24 DIAGNOSIS — F0280 Dementia in other diseases classified elsewhere without behavioral disturbance: Secondary | ICD-10-CM | POA: Diagnosis present

## 2018-01-24 DIAGNOSIS — H9190 Unspecified hearing loss, unspecified ear: Secondary | ICD-10-CM | POA: Diagnosis not present

## 2018-01-24 DIAGNOSIS — L89150 Pressure ulcer of sacral region, unstageable: Secondary | ICD-10-CM

## 2018-01-24 DIAGNOSIS — Z8719 Personal history of other diseases of the digestive system: Secondary | ICD-10-CM | POA: Diagnosis not present

## 2018-01-24 DIAGNOSIS — Z9981 Dependence on supplemental oxygen: Secondary | ICD-10-CM | POA: Diagnosis not present

## 2018-01-24 DIAGNOSIS — K219 Gastro-esophageal reflux disease without esophagitis: Secondary | ICD-10-CM | POA: Diagnosis present

## 2018-01-24 DIAGNOSIS — N183 Chronic kidney disease, stage 3 (moderate): Secondary | ICD-10-CM | POA: Diagnosis present

## 2018-01-24 DIAGNOSIS — I110 Hypertensive heart disease with heart failure: Secondary | ICD-10-CM | POA: Diagnosis present

## 2018-01-24 DIAGNOSIS — Z820 Family history of epilepsy and other diseases of the nervous system: Secondary | ICD-10-CM

## 2018-01-24 DIAGNOSIS — D649 Anemia, unspecified: Secondary | ICD-10-CM | POA: Diagnosis not present

## 2018-01-24 DIAGNOSIS — Z7982 Long term (current) use of aspirin: Secondary | ICD-10-CM | POA: Diagnosis not present

## 2018-01-24 DIAGNOSIS — F329 Major depressive disorder, single episode, unspecified: Secondary | ICD-10-CM | POA: Diagnosis present

## 2018-01-24 DIAGNOSIS — E876 Hypokalemia: Secondary | ICD-10-CM | POA: Diagnosis present

## 2018-01-24 DIAGNOSIS — Z8249 Family history of ischemic heart disease and other diseases of the circulatory system: Secondary | ICD-10-CM

## 2018-01-24 DIAGNOSIS — J9621 Acute and chronic respiratory failure with hypoxia: Secondary | ICD-10-CM | POA: Diagnosis present

## 2018-01-24 DIAGNOSIS — Z7951 Long term (current) use of inhaled steroids: Secondary | ICD-10-CM

## 2018-01-24 DIAGNOSIS — R269 Unspecified abnormalities of gait and mobility: Secondary | ICD-10-CM | POA: Diagnosis present

## 2018-01-24 DIAGNOSIS — I10 Essential (primary) hypertension: Secondary | ICD-10-CM | POA: Diagnosis not present

## 2018-01-24 DIAGNOSIS — J449 Chronic obstructive pulmonary disease, unspecified: Secondary | ICD-10-CM | POA: Diagnosis not present

## 2018-01-24 DIAGNOSIS — D72829 Elevated white blood cell count, unspecified: Secondary | ICD-10-CM | POA: Diagnosis not present

## 2018-01-24 DIAGNOSIS — Z22322 Carrier or suspected carrier of Methicillin resistant Staphylococcus aureus: Secondary | ICD-10-CM

## 2018-01-24 DIAGNOSIS — Z79899 Other long term (current) drug therapy: Secondary | ICD-10-CM

## 2018-01-24 DIAGNOSIS — Z888 Allergy status to other drugs, medicaments and biological substances status: Secondary | ICD-10-CM

## 2018-01-24 HISTORY — DX: Gastrointestinal hemorrhage, unspecified: K92.2

## 2018-01-24 LAB — BLOOD CULTURE ID PANEL (REFLEXED)
Acinetobacter baumannii: NOT DETECTED
CANDIDA GLABRATA: NOT DETECTED
CANDIDA TROPICALIS: NOT DETECTED
Candida albicans: NOT DETECTED
Candida krusei: NOT DETECTED
Candida parapsilosis: NOT DETECTED
Enterobacter cloacae complex: NOT DETECTED
Enterobacteriaceae species: NOT DETECTED
Enterococcus species: NOT DETECTED
Escherichia coli: NOT DETECTED
Haemophilus influenzae: NOT DETECTED
KLEBSIELLA PNEUMONIAE: NOT DETECTED
Klebsiella oxytoca: NOT DETECTED
Listeria monocytogenes: NOT DETECTED
Methicillin resistance: DETECTED — AB
Neisseria meningitidis: NOT DETECTED
Proteus species: NOT DETECTED
Pseudomonas aeruginosa: NOT DETECTED
Serratia marcescens: NOT DETECTED
Staphylococcus aureus (BCID): NOT DETECTED
Staphylococcus species: DETECTED — AB
Streptococcus agalactiae: NOT DETECTED
Streptococcus pneumoniae: NOT DETECTED
Streptococcus pyogenes: NOT DETECTED
Streptococcus species: NOT DETECTED

## 2018-01-24 LAB — COMPREHENSIVE METABOLIC PANEL
ALT: 14 U/L (ref 0–44)
AST: 24 U/L (ref 15–41)
Albumin: 3.5 g/dL (ref 3.5–5.0)
Alkaline Phosphatase: 78 U/L (ref 38–126)
Anion gap: 10 (ref 5–15)
BILIRUBIN TOTAL: 0.8 mg/dL (ref 0.3–1.2)
BUN: 18 mg/dL (ref 8–23)
CHLORIDE: 104 mmol/L (ref 98–111)
CO2: 26 mmol/L (ref 22–32)
CREATININE: 0.87 mg/dL (ref 0.44–1.00)
Calcium: 8.6 mg/dL — ABNORMAL LOW (ref 8.9–10.3)
GFR, EST NON AFRICAN AMERICAN: 58 mL/min — AB (ref >60)
Glucose, Bld: 126 mg/dL — ABNORMAL HIGH (ref 70–99)
Potassium: 3.4 mmol/L — ABNORMAL LOW (ref 3.5–5.1)
Sodium: 140 mmol/L (ref 135–145)
TOTAL PROTEIN: 6.7 g/dL (ref 6.5–8.1)

## 2018-01-24 LAB — CBC WITH DIFFERENTIAL/PLATELET
Abs Immature Granulocytes: 0.33 10*3/uL — ABNORMAL HIGH (ref 0.00–0.07)
BASOS ABS: 0 10*3/uL (ref 0.0–0.1)
BASOS PCT: 0 %
EOS ABS: 0 10*3/uL (ref 0.0–0.5)
EOS PCT: 0 %
HCT: 29 % — ABNORMAL LOW (ref 36.0–46.0)
Hemoglobin: 8.3 g/dL — ABNORMAL LOW (ref 12.0–15.0)
Immature Granulocytes: 1 %
LYMPHS PCT: 3 %
Lymphs Abs: 0.8 10*3/uL (ref 0.7–4.0)
MCH: 23.4 pg — ABNORMAL LOW (ref 26.0–34.0)
MCHC: 28.6 g/dL — AB (ref 30.0–36.0)
MCV: 81.7 fL (ref 80.0–100.0)
MONO ABS: 1.5 10*3/uL — AB (ref 0.1–1.0)
Monocytes Relative: 6 %
NRBC: 0 % (ref 0.0–0.2)
Neutro Abs: 23.3 10*3/uL — ABNORMAL HIGH (ref 1.7–7.7)
Neutrophils Relative %: 90 %
PLATELETS: 224 10*3/uL (ref 150–400)
RBC: 3.55 MIL/uL — ABNORMAL LOW (ref 3.87–5.11)
RDW: 15.6 % — ABNORMAL HIGH (ref 11.5–15.5)
WBC: 26 10*3/uL — AB (ref 4.0–10.5)

## 2018-01-24 LAB — CG4 I-STAT (LACTIC ACID)
Lactic Acid, Venous: 2.11 mmol/L (ref 0.5–1.9)
Lactic Acid, Venous: 1.67 mmol/L (ref 0.5–1.9)

## 2018-01-24 LAB — URINALYSIS, ROUTINE W REFLEX MICROSCOPIC
BACTERIA UA: NONE SEEN
Bilirubin Urine: NEGATIVE
Glucose, UA: NEGATIVE mg/dL
Hgb urine dipstick: NEGATIVE
Ketones, ur: NEGATIVE mg/dL
Leukocytes, UA: NEGATIVE
NITRITE: NEGATIVE
Protein, ur: 30 mg/dL — AB
Specific Gravity, Urine: 1.016 (ref 1.005–1.030)
pH: 5 (ref 5.0–8.0)

## 2018-01-24 LAB — STREP PNEUMONIAE URINARY ANTIGEN: Strep Pneumo Urinary Antigen: NEGATIVE

## 2018-01-24 LAB — INFLUENZA PANEL BY PCR (TYPE A & B)
INFLAPCR: NEGATIVE
INFLBPCR: NEGATIVE

## 2018-01-24 MED ORDER — VANCOMYCIN HCL IN DEXTROSE 1-5 GM/200ML-% IV SOLN: 1000.0000 mg | Freq: Once | INTRAVENOUS | Status: DC

## 2018-01-24 MED ORDER — DOCUSATE SODIUM 100 MG PO CAPS
100.0000 mg | ORAL_CAPSULE | Freq: Two times a day (BID) | ORAL | Status: DC
  Administered 2018-01-24 – 2018-01-31 (×12): 100 mg via ORAL
  Filled 2018-01-24 (×13): qty 1

## 2018-01-24 MED ORDER — PIPERACILLIN-TAZOBACTAM 3.375 G IVPB
3.3750 g | Freq: Three times a day (TID) | INTRAVENOUS | Status: AC
  Administered 2018-01-24 – 2018-01-29 (×14): 3.375 g via INTRAVENOUS
  Filled 2018-01-24 (×14): qty 50

## 2018-01-24 MED ORDER — SIMVASTATIN 20 MG PO TABS
20.0000 mg | ORAL_TABLET | Freq: Every day | ORAL | Status: DC
  Administered 2018-01-24 – 2018-01-30 (×7): 20 mg via ORAL
  Filled 2018-01-24: qty 2
  Filled 2018-01-24: qty 1
  Filled 2018-01-24 (×2): qty 2
  Filled 2018-01-24: qty 1
  Filled 2018-01-24 (×2): qty 2

## 2018-01-24 MED ORDER — VALACYCLOVIR HCL 500 MG PO TABS
500.0000 mg | ORAL_TABLET | Freq: Two times a day (BID) | ORAL | Status: DC
  Administered 2018-01-24 – 2018-01-31 (×14): 500 mg via ORAL
  Filled 2018-01-24 (×15): qty 1

## 2018-01-24 MED ORDER — IPRATROPIUM-ALBUTEROL 0.5-2.5 (3) MG/3ML IN SOLN
3.0000 mL | Freq: Once | RESPIRATORY_TRACT | Status: AC
  Administered 2018-01-24: 3 mL via RESPIRATORY_TRACT
  Filled 2018-01-24: qty 3

## 2018-01-24 MED ORDER — SODIUM CHLORIDE 0.9 % IV SOLN
2.0000 g | Freq: Once | INTRAVENOUS | Status: AC
  Administered 2018-01-24: 2 g via INTRAVENOUS
  Filled 2018-01-24: qty 2

## 2018-01-24 MED ORDER — TRAZODONE HCL 50 MG PO TABS
25.0000 mg | ORAL_TABLET | Freq: Every evening | ORAL | Status: DC | PRN
  Administered 2018-01-25 – 2018-01-27 (×3): 25 mg via ORAL
  Filled 2018-01-24 (×3): qty 1

## 2018-01-24 MED ORDER — ONDANSETRON HCL 4 MG/2ML IJ SOLN: 4.0000 mg | Freq: Four times a day (QID) | INTRAMUSCULAR | Status: DC | PRN

## 2018-01-24 MED ORDER — METOPROLOL TARTRATE 25 MG PO TABS
25.0000 mg | ORAL_TABLET | Freq: Two times a day (BID) | ORAL | Status: DC
  Administered 2018-01-24 – 2018-01-31 (×15): 25 mg via ORAL
  Filled 2018-01-24 (×15): qty 1

## 2018-01-24 MED ORDER — FLUTICASONE PROPIONATE 50 MCG/ACT NA SUSP
1.0000 | Freq: Every day | NASAL | Status: DC
  Administered 2018-01-26 – 2018-01-31 (×6): 1 via NASAL
  Filled 2018-01-24 (×2): qty 16

## 2018-01-24 MED ORDER — ACETAMINOPHEN 325 MG PO TABS: 650.0000 mg | ORAL_TABLET | Freq: Four times a day (QID) | ORAL | Status: DC | PRN

## 2018-01-24 MED ORDER — ACETAMINOPHEN 650 MG RE SUPP: 650.0000 mg | Freq: Four times a day (QID) | RECTAL | Status: DC | PRN

## 2018-01-24 MED ORDER — QUETIAPINE FUMARATE 25 MG PO TABS
50.0000 mg | ORAL_TABLET | Freq: Every day | ORAL | Status: DC
  Administered 2018-01-24 – 2018-01-30 (×7): 50 mg via ORAL
  Filled 2018-01-24 (×7): qty 2

## 2018-01-24 MED ORDER — SODIUM CHLORIDE 0.9 % IV SOLN
INTRAVENOUS | Status: DC | PRN
  Administered 2018-01-24 – 2018-01-28 (×2): 250 mL via INTRAVENOUS

## 2018-01-24 MED ORDER — PANTOPRAZOLE SODIUM 40 MG PO TBEC
40.0000 mg | DELAYED_RELEASE_TABLET | Freq: Two times a day (BID) | ORAL | Status: DC
  Administered 2018-01-24 – 2018-01-31 (×14): 40 mg via ORAL
  Filled 2018-01-24 (×14): qty 1

## 2018-01-24 MED ORDER — FUROSEMIDE 10 MG/ML IJ SOLN
40.0000 mg | Freq: Two times a day (BID) | INTRAMUSCULAR | Status: DC
  Administered 2018-01-24 – 2018-01-28 (×9): 40 mg via INTRAVENOUS
  Filled 2018-01-24 (×9): qty 4

## 2018-01-24 MED ORDER — GALANTAMINE HYDROBROMIDE 4 MG PO TABS
4.0000 mg | ORAL_TABLET | Freq: Two times a day (BID) | ORAL | Status: DC
  Administered 2018-01-24 – 2018-01-29 (×11): 4 mg via ORAL
  Filled 2018-01-24 (×14): qty 1

## 2018-01-24 MED ORDER — IPRATROPIUM BROMIDE 0.02 % IN SOLN
0.5000 mg | Freq: Three times a day (TID) | RESPIRATORY_TRACT | Status: DC
  Administered 2018-01-24 – 2018-01-27 (×9): 0.5 mg via RESPIRATORY_TRACT
  Filled 2018-01-24 (×9): qty 2.5

## 2018-01-24 MED ORDER — ASPIRIN EC 81 MG PO TBEC
81.0000 mg | DELAYED_RELEASE_TABLET | Freq: Every day | ORAL | Status: DC
  Administered 2018-01-24 – 2018-01-31 (×8): 81 mg via ORAL
  Filled 2018-01-24 (×7): qty 1

## 2018-01-24 MED ORDER — SODIUM CHLORIDE 0.9 % IV SOLN
INTRAVENOUS | Status: DC | PRN
  Administered 2018-01-24: 250 mL via INTRAVENOUS

## 2018-01-24 MED ORDER — BISACODYL 5 MG PO TBEC
5.0000 mg | DELAYED_RELEASE_TABLET | Freq: Every day | ORAL | Status: DC | PRN
  Filled 2018-01-24: qty 1

## 2018-01-24 MED ORDER — DONEPEZIL HCL 5 MG PO TABS
10.0000 mg | ORAL_TABLET | Freq: Every day | ORAL | Status: DC
  Administered 2018-01-24 – 2018-01-30 (×7): 10 mg via ORAL
  Filled 2018-01-24 (×9): qty 2

## 2018-01-24 MED ORDER — ENOXAPARIN SODIUM 30 MG/0.3ML ~~LOC~~ SOLN
30.0000 mg | SUBCUTANEOUS | Status: DC
  Administered 2018-01-24: 30 mg via SUBCUTANEOUS
  Filled 2018-01-24: qty 0.3

## 2018-01-24 MED ORDER — METHYLPREDNISOLONE SODIUM SUCC 125 MG IJ SOLR
60.0000 mg | Freq: Two times a day (BID) | INTRAMUSCULAR | Status: DC
  Administered 2018-01-24 – 2018-01-26 (×5): 60 mg via INTRAVENOUS
  Filled 2018-01-24 (×5): qty 2

## 2018-01-24 MED ORDER — ONDANSETRON HCL 4 MG PO TABS: 4.0000 mg | ORAL_TABLET | Freq: Four times a day (QID) | ORAL | Status: DC | PRN

## 2018-01-24 MED ORDER — IPRATROPIUM BROMIDE 0.02 % IN SOLN: 0.5000 mg | Freq: Three times a day (TID) | RESPIRATORY_TRACT | Status: DC

## 2018-01-24 MED ORDER — VANCOMYCIN HCL 10 G IV SOLR
1250.0000 mg | Freq: Once | INTRAVENOUS | Status: AC
  Administered 2018-01-24: 1250 mg via INTRAVENOUS
  Filled 2018-01-24: qty 1250

## 2018-01-24 MED ORDER — CITALOPRAM HYDROBROMIDE 20 MG PO TABS
20.0000 mg | ORAL_TABLET | Freq: Every day | ORAL | Status: DC
  Administered 2018-01-24 – 2018-01-31 (×8): 20 mg via ORAL
  Filled 2018-01-24 (×8): qty 1

## 2018-01-24 MED ORDER — VANCOMYCIN HCL IN DEXTROSE 1-5 GM/200ML-% IV SOLN
1000.0000 mg | INTRAVENOUS | Status: DC
  Administered 2018-01-25: 1000 mg via INTRAVENOUS
  Filled 2018-01-24: qty 200

## 2018-01-24 NOTE — Progress Notes (Signed)
Family Meeting Note  Advance Directive:yes  Today a meeting took place with t patient son. [name] admitted for pneumonia and also has advanced age, history of CVA, heart failure, recommend DNR status but son wanted to be full code and needed intubate her.  Patient having coarse breath sounds now and getting treatment for pneumonia her pneumonia severity index is high.   The following clinical team members were present during this meeting:MD  The following were discussed:Patient's diagnosis: , Patient's progosis: Unable to determine and Goals for treatment: Full Code  Additional follow-up to be provided:   Time spent during discussion;16 min Katha Hamming, MD

## 2018-01-24 NOTE — ED Provider Notes (Signed)
[facility] Emergency Department Provider Note  ____________________________________________   First MD Initiated Contact with Patient 01/24/18 [phone]     (approximate)  I have reviewed the triage vital signs and the nursing notes.  Level 5 caveat history physical exam review of system limited secondary to dementia.  HISTORY  Chief Complaint Shortness of Breath    HPI Rita Vang is a 85 y.o. female presents to the emergency department via EMS from the [facility] nursing facility with concern for possible sepsis.  [facility] with occultly breathing which patient states has been happening for "a while".  Patient was noted to be hypoxic on baseline 2 L of oxygen via nasal cannula.  Patient actively coughing on arrival to the emergency department and states that she has been coughing for "a while".  Patient also noted to be febrile.   Past Medical History:  Diagnosis Date  . Anxiety   . Anxiety disorder    unspecified  . Cerebral infarction (HCC)   . CVA (cerebral vascular accident) (HCC)   . Dementia (HCC)   . Depression   . GERD (gastroesophageal reflux disease)   . GI bleed   . History of recurrent UTIs   . HLD (hyperlipidemia)   . HTN (hypertension)   . Vertigo    syncope    Patient Active Problem List   Diagnosis Date Noted  . TIA (transient ischemic attack) 06/17/2017  . Palliative care by specialist   . DNR (do not resuscitate) discussion   . Lewy body dementia without behavioral disturbance (HCC)   . Upper GI bleed 05/01/2017  . Acute gastrointestinal hemorrhage   . Gastric irritation   . Herpes genitalis 04/04/2017  . Protein-calorie malnutrition (HCC) 04/04/2017  . Hypomagnesemia 04/04/2017  . Vitamin B12 deficiency 04/04/2017  . Generalized weakness 04/01/2017  . Pressure injury of skin 03/07/2017  . Sepsis (HCC) 03/06/2017  . CAP (community acquired pneumonia) 03/06/2017  . HTN (hypertension) 03/06/2017   . Anxiety 03/06/2017  . HLD (hyperlipidemia) 03/06/2017  . GERD (gastroesophageal reflux disease) 03/06/2017  . CKD (chronic kidney disease), stage III (HCC) 03/06/2017  . CVA (cerebral infarction) 08/23/2014    Past Surgical History:  Procedure Laterality Date  . CARPAL TUNNEL RELEASE Right 1995   endoscopic   . CARPAL TUNNEL RELEASE Left 04/30/2013   endoscopic , Dr. Rosita KeaMenz   . CATARACT EXTRACTION Right   . CHOLECYSTECTOMY    . ELBOW SURGERY Left   . ERCP W/ SPHINCTEROTOMY AND BALLOON DILATION  2008   and endoscopy  . ESOPHAGOGASTRODUODENOSCOPY (EGD) WITH PROPOFOL N/A 05/01/2017   Procedure: ESOPHAGOGASTRODUODENOSCOPY (EGD) WITH PROPOFOL;  Surgeon: Pasty Spillersahiliani, Varnita B, MD;  Location: [facility] ENDOSCOPY;  Service: Endoscopy;  Laterality: N/A;  . ORIF DISTAL RADIUS FRACTURE Right 2002  . TOTAL KNEE ARTHROPLASTY Left 11/23/2007  . TRIGGER FINGER RELEASE  04/30/2013   third digit, incision tendon sheath for trigger finger    Prior to Admission medications   Medication Sig Start Date End Date Taking? Authorizing Provider  aspirin EC 81 MG tablet Take 81 mg by mouth daily.   Yes [provider]  citalopram (CELEXA) 20 MG tablet Take 1 tablet by mouth daily.  12/30/13  Yes [provider]  donepezil (ARICEPT) 10 MG tablet Take 1 tablet by mouth at bedtime.  02/02/14  Yes [provider]  fluticasone (FLONASE) 50 MCG/ACT nasal spray Place 1 spray into both nostrils daily.  02/24/14  Yes [provider]  furosemide (LASIX) 20 MG tablet Take 10 mg by mouth daily.   Yes [provider]  galantamine (RAZADYNE) 4 MG tablet Take 4 mg by mouth 2 (two) times daily with a meal.   Yes [provider]  ipratropium (ATROVENT HFA) 17 MCG/ACT inhaler Inhale 2 puffs into the lungs 3 (three) times daily.   Yes [provider]  metoprolol tartrate (LOPRESSOR) 25 MG tablet Take 1 tablet (25 mg total) by mouth 2 (two) times daily. 05/07/17  Yes  Altamese Dilling, MD  pantoprazole (PROTONIX) 40 MG tablet Take 1 tablet (40 mg total) by mouth 2 (two) times daily before a meal. Patient taking differently: Take 40 mg by mouth daily.  05/07/17  Yes Altamese Dilling, MD  QUEtiapine (SEROQUEL) 50 MG tablet Take 50 mg by mouth at bedtime.    Yes [provider]  sennosides-docusate sodium (SENOKOT-S) 8.6-50 MG tablet Take 1 tablet by mouth 2 (two) times daily.    Yes [provider]  simvastatin (ZOCOR) 20 MG tablet Take 1 tablet by mouth daily at 8 pm.  04/22/17  Yes [provider]  traMADol (ULTRAM) 50 MG tablet Take 50 mg by mouth 2 (two) times daily.    Yes [provider]  valACYclovir (VALTREX) 500 MG tablet Take 500 mg by mouth 2 (two) times daily.   Yes [provider]  furosemide (LASIX) 20 MG tablet Take 10 mg by mouth daily as needed for edema.    [provider]  hydroxypropyl methylcellulose / hypromellose (ISOPTO TEARS / GONIOVISC) 2.5 % ophthalmic solution Place 1 drop into both eyes 4 (four) times daily as needed for dry eyes.    [provider]  [employer] [facility] EX) Apply 1 application topically daily as needed. Apply to left lower extremities    [provider]    Allergies Sulfamethoxazole-trimethoprim; Oxycontin [oxycodone hcl]; and Clarithromycin  Family History  Problem Relation Age of Onset  . Heart attack Father   . Alzheimer's disease Mother     Social History Social History   Tobacco Use  . Smoking status: Never Smoker  . Smokeless tobacco: Never Used  . Tobacco comment: quit at age 66  Substance Use Topics  . Alcohol use: Never    Frequency: Never  . Drug use: Not Currently    Review of Systems Constitutional: Positive for fever Eyes: No visual changes. ENT: No sore throat. Cardiovascular: Denies chest pain. Respiratory: Positive for shortness of breath.  Positive for cough Gastrointestinal: No  abdominal pain.  No nausea, no vomiting.  No diarrhea.  No constipation. Genitourinary: Negative for dysuria. Musculoskeletal: Negative for neck pain.  Negative for back pain. Integumentary: Negative for rash. Neurological: Negative for headaches, focal weakness or numbness.   ____________________________________________   PHYSICAL EXAM:  VITAL SIGNS: ED Triage Vitals [01/24/18 0609]  Enc Vitals Group     BP      Pulse      Resp      Temp      Temp src      SpO2      Weight 61.1 kg (134 lb 9.6 oz)     Height 1.626 m (5\' 4" )     Head Circumference      Peak Flow      Pain Score 0     Pain Loc      Pain Edu?      Excl. in GC?     Constitutional: Alert and oriented.  Ill-appearing coughing.   Eyes: Conjunctivae are normal. Mouth/Throat: Mucous membranes are moist.  Oropharynx non-erythematous. Neck: No stridor.   Cardiovascular: Normal rate, regular rhythm. Good peripheral circulation. Grossly normal heart sounds. Respiratory: Tachypnea, positive accessory respiratory muscle use, diffuse rhonchi. Gastrointestinal: Soft and nontender. No distention.  Musculoskeletal: No lower extremity tenderness nor edema. No gross deformities of extremities. Neurologic:  Normal speech and language. No gross focal neurologic deficits are appreciated.  Skin:  Skin is warm, dry and intact. No rash noted. Psychiatric: Mood and affect are normal. Speech and behavior are normal.  ____________________________________________   LABS (all labs ordered are listed, but only abnormal results are displayed)  Labs Reviewed  COMPREHENSIVE METABOLIC PANEL - Abnormal; Notable for the following components:      Result Value   Potassium 3.4 (*)    Glucose, Bld 126 (*)    Calcium 8.6 (*)    GFR calc non Af Amer 58 (*)    All other components within normal limits  CBC WITH DIFFERENTIAL/PLATELET - Abnormal; Notable for the following components:   WBC 26.0 (*)    RBC 3.55 (*)    Hemoglobin 8.3 (*)      HCT 29.0 (*)    MCH 23.4 (*)    MCHC 28.6 (*)    RDW 15.6 (*)    Neutro Abs 23.3 (*)    Monocytes Absolute 1.5 (*)    Abs Immature Granulocytes 0.33 (*)    All other components within normal limits  URINALYSIS, ROUTINE W REFLEX MICROSCOPIC - Abnormal; Notable for the following components:   Color, Urine YELLOW (*)    APPearance CLEAR (*)    Protein, ur 30 (*)    All other components within normal limits  CG4 I-STAT (LACTIC ACID) - Abnormal; Notable for the following components:   Lactic Acid, Venous 2.11 (*)    All other components within normal limits  CULTURE, BLOOD (ROUTINE X 2)  CULTURE, BLOOD (ROUTINE X 2)  INFLUENZA PANEL BY PCR (TYPE A & B)  I-STAT CG4 LACTIC ACID, ED  I-STAT CG4 LACTIC ACID, ED   ____________________________________________  EKG  ED ECG REPORT I, Ambridge N BROWN, the attending physician, personally viewed and interpreted this ECG.   Date: 01/24/2018  EKG Time: 6:11 AM  Rate: 118  Rhythm: Atrial fibrillation with rapid ventricular response  Axis: Normal  Intervals: Normal  ST&T Change: None  ____________________________________________  RADIOLOGY I, Whalan N BROWN, personally viewed and evaluated these images (plain radiographs) as part of my medical decision making, as well as reviewing the written report by the radiologist.  ED MD interpretation: Audio megaly and pulmonary vascular congestion asymmetric right perihilar airspace opacities.  Official radiology report(s): Dg Chest Port 1 View  Result Date: 01/24/2018 CLINICAL DATA:  Fever and cough. EXAM: PORTABLE CHEST 1 VIEW COMPARISON:  Two-view chest x-ray 05/04/2017 FINDINGS: The heart is enlarged. Aortic atherosclerosis is present. Asymmetric right perihilar airspace opacity is noted. Mild pulmonary vascular congestion is present. No other significant airspace disease is present. Remote posterior left rib fractures and multiple spine fractures are again noted. Remote right humerus  fracture is noted. IMPRESSION: 1. Cardiomegaly and pulmonary vascular congestion likely reflects some element of congestive heart failure. 2. Asymmetric right perihilar airspace opacity. The patient has chronic asymmetric right perihilar prominence. This has increased. While this may represent pulmonary vascular congestion, infection is not excluded. Recommend follow-up chest x-ray to assure improvement. Electronically Signed   By: Marin Robertshristopher  Mattern M.D.   On: 01/24/2018 07:09  Critical Care performed:   .Critical Care Performed by: Darci Current, MD Authorized by: Darci Current, MD   Critical care provider statement:    Critical care time (minutes):  45   Critical care time was exclusive of:  Separately billable procedures and treating other patients   Critical care was necessary to treat or prevent imminent or life-threatening deterioration of the following conditions:  Sepsis and respiratory failure   Critical care was time spent personally by me on the following activities:  Development of treatment plan with patient or surrogate, discussions with consultants, evaluation of patient's response to treatment, examination of patient, obtaining history from patient or surrogate, ordering and performing treatments and interventions, ordering and review of laboratory studies, ordering and review of radiographic studies, pulse oximetry, re-evaluation of patient's condition and review of old charts   I assumed direction of critical care for this patient from another provider in my specialty: no       ____________________________________________   INITIAL IMPRESSION / ASSESSMENT AND PLAN / ED COURSE  As part of my medical decision making, I reviewed the following data within the electronic MEDICAL RECORD NUMBER   85 year old female presented with above-stated history and physical exam concerning for possible sepsis.  As such sepsis protocol initiated.  Concern for possible pneumonia  given clinical exam findings.  Patient given IV antibiotic therapy.  Patient discussed with Dr. Sheryle Hail for hospital admission further evaluation and management.  ____________________________________________  FINAL CLINICAL IMPRESSION(S) / ED DIAGNOSES  Final diagnoses:  Sepsis, due to unspecified organism, unspecified whether acute organ dysfunction present [facility])     MEDICATIONS GIVEN DURING THIS VISIT:  Medications  ceFEPIme (MAXIPIME) 2 g in sodium chloride 0.9 % 100 mL IVPB (2 g Intravenous New Bag/Given 01/24/18 0723)  vancomycin (VANCOCIN) 1,250 mg in sodium chloride 0.9 % 250 mL IVPB (has no administration in time range)  0.9 %  sodium chloride infusion (250 mLs Intravenous New Bag/Given 01/24/18 0722)  0.9 %  sodium chloride infusion (has no administration in time range)  ipratropium-albuterol (DUONEB) 0.5-2.5 (3) MG/3ML nebulizer solution 3 mL (3 mLs Nebulization Given 01/24/18 0637)  ipratropium-albuterol (DUONEB) 0.5-2.5 (3) MG/3ML nebulizer solution 3 mL (3 mLs Nebulization Given 01/24/18 2831)     ED Discharge Orders    None       Note:  This document was prepared using Dragon voice recognition software and may include unintentional dictation errors.    Darci Current, MD 01/24/18 [phone]

## 2018-01-24 NOTE — ED Notes (Addendum)
ED TO INPATIENT HANDOFF REPORT  Name/Age/Gender Rita Vang 85 y.o. female  Code Status    Code Status Orders  (From admission, onward)         Start     Ordered   01/24/18 0743  Full code  Continuous     01/24/18 0745        Code Status History    Date Active Date Inactive Code Status Order ID Comments User Context   06/17/2017 1738 06/18/2017 1951 Partial Code [id number]  Ihor Austin, MD Inpatient   05/07/2017 1140 05/07/2017 1817 Partial Code [id number]  Altamese Dilling, MD Inpatient   05/01/2017 0414 05/07/2017 1139 Full Code [id number]  Arnaldo Natal, MD Inpatient   03/06/2017 2221 03/12/2017 1838 Full Code [id number]  Oralia Manis, MD ED   08/23/2014 1801 08/24/2014 1810 Full Code [id number]  Adrian Saran, MD ED    Advance Directive Documentation     Most Recent Value  Type of Advance Directive  Out of facility DNR (pink MOST or yellow form)  Pre-existing out of facility DNR order (yellow form or pink MOST form)  -  "MOST" Form in Place?  -      Home/SNF/Other Nursing Home - [facility]  Chief Complaint Code Sepsis  Level of Care/Admitting Diagnosis ED Disposition    ED Disposition Condition Comment   Admit  [facility]ospital Area: [facility] [[id number]]  Level of Care: Telemetry [5]  Diagnosis: Sepsis [facility]) [[id number]]  Admitting Physician: Katha Hamming [[id number]]  Attending Physician: Katha Hamming [phone]  Estimated length of stay: past midnight tomorrow  Certification:: I certify this patient will need inpatient services for at least 2 midnights  PT Class (Do Not Modify): Inpatient [101]  PT Acc Code (Do Not Modify): Private [1]       Medical History Past Medical History:  Diagnosis Date  . Anxiety   . Anxiety disorder    unspecified  . Cerebral infarction (HCC)   . CVA (cerebral vascular accident) (HCC)   . Dementia (HCC)   . Depression   . GERD (gastroesophageal reflux disease)   . GI bleed   . History of  recurrent UTIs   . HLD (hyperlipidemia)   . HTN (hypertension)   . Vertigo    syncope    Allergies Allergies  Allergen Reactions  . Sulfamethoxazole-Trimethoprim Other (See Comments)    Chest pain  . Oxycontin [Oxycodone Hcl] Other (See Comments)    Hallucinations   . Clarithromycin Other (See Comments)    Abdominal pain     IV Location/Drains/Wounds Patient Lines/Drains/Airways Status   Active Line/Drains/Airways    Name:   Placement date:   Placement time:   Site:   Days:   Peripheral IV 01/24/18 Right Hand   01/24/18    0624    Hand   less than 1   Peripheral IV 01/24/18 Left Wrist   01/24/18    0624    Wrist   less than 1   External Urinary Catheter   05/02/17    1707    -   267   Pressure Injury 03/06/17 Stage II -  Partial thickness loss of dermis presenting as a shallow open ulcer with a red, pink wound bed without slough.   03/06/17    2326     324   Pressure Injury 06/17/17 Stage II -  Partial thickness loss of dermis presenting as a shallow open ulcer with a red, pink wound bed without slough.   06/17/17  1850     221          Labs/Imaging Results for orders placed or performed during the hospital encounter of 01/24/18 (from the past 48 hour(s))  Urinalysis, Routine w reflex microscopic     Status: Abnormal   Collection Time: 01/24/18  6:15 AM  Result Value Ref Range   Color, Urine YELLOW (A) YELLOW   APPearance CLEAR (A) CLEAR   Specific Gravity, Urine 1.016 1.005 - 1.030   pH 5.0 5.0 - 8.0   Glucose, UA NEGATIVE NEGATIVE mg/dL   Hgb urine dipstick NEGATIVE NEGATIVE   Bilirubin Urine NEGATIVE NEGATIVE   Ketones, ur NEGATIVE NEGATIVE mg/dL   Protein, ur 30 (A) NEGATIVE mg/dL   Nitrite NEGATIVE NEGATIVE   Leukocytes, UA NEGATIVE NEGATIVE   RBC / HPF 0-5 0 - 5 RBC/hpf   WBC, UA 0-5 0 - 5 WBC/hpf   Bacteria, UA NONE SEEN NONE SEEN   Squamous Epithelial / LPF 0-5 0 - 5   Mucus PRESENT    Hyaline Casts, UA PRESENT     Comment: Performed at [facility], [address] [place], [place] [address]  Influenza panel by PCR (type A & B)     Status: None   Collection Time: 01/24/18  6:15 AM  Result Value Ref Range   Influenza A By PCR NEGATIVE NEGATIVE   Influenza B By PCR NEGATIVE NEGATIVE    Comment: (NOTE) The Xpert Xpress Flu assay is intended as an aid in the diagnosis of  influenza and should not be used as a sole basis for treatment.  This  assay is FDA approved for nasopharyngeal swab specimens only. Nasal  washings and aspirates are unacceptable for Xpert Xpress Flu testing. Performed at [facility], [address] [place], [place] [address]   Comprehensive metabolic panel     Status: Abnormal   Collection Time: 01/24/18  6:16 AM  Result Value Ref Range   Sodium 140 135 - 145 mmol/L   Potassium 3.4 (L) 3.5 - 5.1 mmol/L   Chloride 104 98 - 111 mmol/L   CO2 26 22 - 32 mmol/L   Glucose, Bld 126 (H) 70 - 99 mg/dL   BUN 18 8 - 23 mg/dL   Creatinine, Ser 4.25 0.44 - 1.00 mg/dL   Calcium 8.6 (L) 8.9 - 10.3 mg/dL   Total Protein 6.7 6.5 - 8.1 g/dL   Albumin 3.5 3.5 - 5.0 g/dL   AST 24 15 - 41 U/L   ALT 14 0 - 44 U/L   Alkaline Phosphatase 78 38 - 126 U/L   Total Bilirubin 0.8 0.3 - 1.2 mg/dL   GFR calc non Af Amer 58 (L) >60 mL/min   GFR calc Af Amer >60 >60 mL/min   Anion gap 10 5 - 15    Comment: Performed at [facility], [address] [place], [place] [address]  CBC WITH DIFFERENTIAL     Status: Abnormal   Collection Time: 01/24/18  6:16 AM  Result Value Ref Range   WBC 26.0 (H) 4.0 - 10.5 K/uL   RBC 3.55 (L) 3.87 - 5.11 MIL/uL   Hemoglobin 8.3 (L) 12.0 - 15.0 g/dL   HCT 75.6 (L) 43.3 - 29.5 %   MCV 81.7 80.0 - 100.0 fL   MCH 23.4 (L) 26.0 - 34.0 pg   MCHC 28.6 (L) 30.0 - 36.0 g/dL   RDW 18.8 (H) 41.6 - 60.6 %   Platelets 224 150 - 400  K/uL   nRBC 0.0 0.0 - 0.2 %   Neutrophils Relative % 90 %   Neutro Abs 23.3 (H) 1.7 - 7.7 K/uL   Lymphocytes Relative 3 %   Lymphs Abs 0.8 0.7 -  4.0 K/uL   Monocytes Relative 6 %   Monocytes Absolute 1.5 (H) 0.1 - 1.0 K/uL   Eosinophils Relative 0 %   Eosinophils Absolute 0.0 0.0 - 0.5 K/uL   Basophils Relative 0 %   Basophils Absolute 0.0 0.0 - 0.1 K/uL   Immature Granulocytes 1 %   Abs Immature Granulocytes 0.33 (H) 0.00 - 0.07 K/uL   Dohle Bodies PRESENT     Comment: Performed at [facility], [address] [place], [place] [id number]  CG4 I-STAT (Lactic acid)     Status: Abnormal   Collection Time: 01/24/18  6:26 AM  Result Value Ref Range   Lactic Acid, Venous 2.11 (HH) 0.5 - 1.9 mmol/L   Comment NOTIFIED PHYSICIAN    Dg Chest Port 1 View  Result Date: 01/24/2018 CLINICAL DATA:  Fever and cough. EXAM: PORTABLE CHEST 1 VIEW COMPARISON:  Two-view chest x-ray 05/04/2017 FINDINGS: The heart is enlarged. Aortic atherosclerosis is present. Asymmetric right perihilar airspace opacity is noted. Mild pulmonary vascular congestion is present. No other significant airspace disease is present. Remote posterior left rib fractures and multiple spine fractures are again noted. Remote right humerus fracture is noted. IMPRESSION: 1. Cardiomegaly and pulmonary vascular congestion likely reflects some element of congestive heart failure. 2. Asymmetric right perihilar airspace opacity. The patient has chronic asymmetric right perihilar prominence. This has increased. While this may represent pulmonary vascular congestion, infection is not excluded. Recommend follow-up chest x-ray to assure improvement. Electronically Signed   By: Marin Robertshristopher  Mattern M.D.   On: 01/24/2018 07:09    Pending Labs Unresulted Labs (From admission, onward)    Start     Ordered   01/31/18 0500  Creatinine, serum  (enoxaparin (LOVENOX)    CrCl >/= 30 ml/min)  Weekly,   STAT    Comments:  while on enoxaparin therapy    01/24/18 0745   01/25/18 0500  Basic metabolic panel  Tomorrow morning,   STAT     01/24/18 0745   01/25/18 0500  CBC  Tomorrow morning,    STAT     01/24/18 0745   01/24/18 0743  Legionella Pneumophila Serogp 1 Ur Ag  Once,   STAT     01/24/18 0745   01/24/18 0742  HIV antibody (Routine Screening)  Once,   STAT     01/24/18 0745   01/24/18 0742  Culture, sputum-assessment  Once,   STAT    Question:  Patient immune status  Answer:  Normal   01/24/18 0745   01/24/18 0742  Strep pneumoniae urinary antigen  Once,   STAT     01/24/18 0745   01/24/18 0609  Blood Culture (routine x 2)  BLOOD CULTURE X 2,   STAT     01/24/18 0609          Vitals/Pain Today's Vitals   01/24/18 0700 01/24/18 0730 01/24/18 0733 01/24/18 0735  BP: (!) 152/89 (!) 162/89    Pulse: (!) 111 (!) 141    Resp: (!) 28 (!) 34    Temp:   99.2 F (37.3 C)   TempSrc:   Oral   SpO2: 100% 95%  92%  Weight:      Height:      PainSc:  Isolation Precautions No active isolations  Medications Medications  vancomycin (VANCOCIN) 1,250 mg in sodium chloride 0.9 % 250 mL IVPB ( Intravenous Rate/Dose Verify 01/24/18 0730)  0.9 %  sodium chloride infusion ( Intravenous Rate/Dose Verify 01/24/18 0730)  0.9 %  sodium chloride infusion ( Intravenous Rate/Dose Verify 01/24/18 0730)  aspirin EC tablet 81 mg (has no administration in time range)  metoprolol tartrate (LOPRESSOR) tablet 25 mg (has no administration in time range)  simvastatin (ZOCOR) tablet 20 mg (has no administration in time range)  pantoprazole (PROTONIX) EC tablet 40 mg (has no administration in time range)  galantamine (RAZADYNE) tablet 4 mg (has no administration in time range)  QUEtiapine (SEROQUEL) tablet 50 mg (has no administration in time range)  citalopram (CELEXA) tablet 20 mg (has no administration in time range)  donepezil (ARICEPT) tablet 10 mg (has no administration in time range)  fluticasone (FLONASE) 50 MCG/ACT nasal spray 1 spray (has no administration in time range)  ipratropium (ATROVENT HFA) inhaler 2 puff (has no administration in time range)  acetaminophen (TYLENOL)  tablet 650 mg (has no administration in time range)    Or  acetaminophen (TYLENOL) suppository 650 mg (has no administration in time range)  traZODone (DESYREL) tablet 25 mg (has no administration in time range)  docusate sodium (COLACE) capsule 100 mg (has no administration in time range)  bisacodyl (DULCOLAX) EC tablet 5 mg (has no administration in time range)  ondansetron (ZOFRAN) tablet 4 mg (has no administration in time range)    Or  ondansetron (ZOFRAN) injection 4 mg (has no administration in time range)  enoxaparin (LOVENOX) injection 40 mg (has no administration in time range)  piperacillin-tazobactam (ZOSYN) IVPB 3.375 g (has no administration in time range)  methylPREDNISolone sodium succinate (SOLU-MEDROL) 125 mg/2 mL injection 60 mg (has no administration in time range)  ceFEPIme (MAXIPIME) 2 g in sodium chloride 0.9 % 100 mL IVPB (0 g Intravenous Stopped 01/24/18 0800)  ipratropium-albuterol (DUONEB) 0.5-2.5 (3) MG/3ML nebulizer solution 3 mL (3 mLs Nebulization Given 01/24/18 0637)  ipratropium-albuterol (DUONEB) 0.5-2.5 (3) MG/3ML nebulizer solution 3 mL (3 mLs Nebulization Given 01/24/18 0637)    Mobility manual wheelchair

## 2018-01-24 NOTE — ED Notes (Signed)
Oxygen increased to 4 LNC.

## 2018-01-24 NOTE — Progress Notes (Addendum)
Pharmacy Antibiotic Note  Rita Vang is a 85 y.o. female admitted on 01/24/2018 with pneumonia.  Pharmacy has been consulted for vancomycin dosing.  Plan: Vancomycin 1000 mg IV Q 36 hrs to start 1/12 at 2000. Goal AUC 400-550. Expected AUC: 469.7 SCr used: 0.87  Zosyn 3.375 g IV q8hr extended infusion  Height: 5\' 4"  (162.6 cm) Weight: 125 lb 9.6 oz (57 kg) IBW/kg (Calculated) : 54.7  Temp (24hrs), Avg:99.3 F (37.4 C), Min:98 F (36.7 C), Max:100.6 F (38.1 C)  Recent Labs  Lab 01/24/18 0616 01/24/18 0626 01/24/18 0833  WBC 26.0*  --   --   CREATININE 0.87  --   --   LATICACIDVEN  --  2.11* 1.67    Estimated Creatinine Clearance: 36.4 mL/min (by C-G formula based on SCr of 0.87 mg/dL).    Allergies  Allergen Reactions  . Sulfamethoxazole-Trimethoprim Other (See Comments)    Chest pain  . Oxycontin [Oxycodone Hcl] Other (See Comments)    Hallucinations   . Clarithromycin Other (See Comments)    Abdominal pain     Antimicrobials this admission: Cefepime 1/11 x 1 dose Vancomycin 1/11 >>  Zosyn 1/11 >>  Dose adjustments this admission: NA  Microbiology results: 1/11 BCx: NG 12 hours 1/11 MRSA PCR: pending  Thank you for allowing pharmacy to be a part of this patient's care.  Pricilla Riffle, PharmD Pharmacy Resident  01/24/2018 10:41 AM

## 2018-01-24 NOTE — H&P (Signed)
[facility] - [facility] at [facility]   PATIENT NAME: Rita Vang    MR#:  [id number]  DATE OF BIRTH:  [birth year]  DATE OF ADMISSION:  01/24/2018  PRIMARY CARE PHYSICIAN: Gracelyn Nurse, MD   REQUESTING/REFERRING PHYSICIAN: Dr. Manson Passey  CHIEF COMPLAINT: Shortness of breath   Chief Complaint  Patient presents with  . Shortness of Breath    HISTORY OF PRESENT ILLNESS:  Rita Vang  is a 85 y.o. female with a known history of hypertension, hyperlipidemia, CVA, depression, GERD brought in from [facility] because of loss of breath, cough, noted to be hypoxic on baseline oxygen 2 L.  Patient also noted to have temperature 0.6 Fahrenheit in the emergency room.  She is on 2 L of oxygen and saturation is about 95%.  Chest x-ray is concerning for multilobar pneumonia on the right side.  Patient also has elevated lactic acid of 2.2 on admission.  PAST MEDICAL HISTORY:   Past Medical History:  Diagnosis Date  . Anxiety   . Anxiety disorder    unspecified  . Cerebral infarction (HCC)   . CVA (cerebral vascular accident) (HCC)   . Dementia (HCC)   . Depression   . GERD (gastroesophageal reflux disease)   . GI bleed   . History of recurrent UTIs   . HLD (hyperlipidemia)   . HTN (hypertension)   . Vertigo    syncope    PAST SURGICAL HISTOIRY:   Past Surgical History:  Procedure Laterality Date  . CARPAL TUNNEL RELEASE Right 1995   endoscopic   . CARPAL TUNNEL RELEASE Left 04/30/2013   endoscopic , Dr. Rosita Kea   . CATARACT EXTRACTION Right   . CHOLECYSTECTOMY    . ELBOW SURGERY Left   . ERCP W/ SPHINCTEROTOMY AND BALLOON DILATION  2008   and endoscopy  . ESOPHAGOGASTRODUODENOSCOPY (EGD) WITH PROPOFOL N/A 05/01/2017   Procedure: ESOPHAGOGASTRODUODENOSCOPY (EGD) WITH PROPOFOL;  Surgeon: Pasty Spillers, MD;  Location: [facility] ENDOSCOPY;  Service: Endoscopy;  Laterality: N/A;  . ORIF DISTAL RADIUS FRACTURE Right 2002  . TOTAL KNEE  ARTHROPLASTY Left 11/23/2007  . TRIGGER FINGER RELEASE  04/30/2013   third digit, incision tendon sheath for trigger finger    SOCIAL HISTORY:   Social History   Tobacco Use  . Smoking status: Never Smoker  . Smokeless tobacco: Never Used  . Tobacco comment: quit at age 59  Substance Use Topics  . Alcohol use: Never    Frequency: Never    FAMILY HISTORY:   Family History  Problem Relation Age of Onset  . Heart attack Father   . Alzheimer's disease Mother     DRUG ALLERGIES:   Allergies  Allergen Reactions  . Sulfamethoxazole-Trimethoprim Other (See Comments)    Chest pain  . Oxycontin [Oxycodone Hcl] Other (See Comments)    Hallucinations   . Clarithromycin Other (See Comments)    Abdominal pain     REVIEW OF SYSTEMS:  CONSTITUTIONAL: She says she does not feel good.  EYES: No blurred or double vision.  EARS, NOSE, AND THROAT: No tinnitus or ear pain.  RESPIRATORY: cough, shortness of breath.  CARDIOVASCULAR: No chest pain, orthopnea, edema.  GASTROINTESTINAL: No nausea, vomiting, diarrhea or abdominal pain.  GENITOURINARY: No dysuria, hematuria.  ENDOCRINE: No polyuria, nocturia,  HEMATOLOGY: No anemia, easy bruising or bleeding SKIN: No rash or lesion. MUSCULOSKELETAL: No joint pain or arthritis.   NEUROLOGIC: No tingling, numbness, weakness.  PSYCHIATRY: No anxiety or depression.   MEDICATIONS  AT HOME:   Prior to Admission medications   Medication Sig Start Date End Date Taking? Authorizing Provider  aspirin EC 81 MG tablet Take 81 mg by mouth daily.   Yes [provider]  citalopram (CELEXA) 20 MG tablet Take 1 tablet by mouth daily.  12/30/13  Yes [provider]  donepezil (ARICEPT) 10 MG tablet Take 1 tablet by mouth at bedtime.  02/02/14  Yes [provider]  fluticasone (FLONASE) 50 MCG/ACT nasal spray Place 1 spray into both nostrils daily.  02/24/14  Yes [provider]  furosemide (LASIX) 20 MG tablet Take 10  mg by mouth daily.   Yes [provider]  galantamine (RAZADYNE) 4 MG tablet Take 4 mg by mouth 2 (two) times daily with a meal.   Yes [provider]  ipratropium (ATROVENT HFA) 17 MCG/ACT inhaler Inhale 2 puffs into the lungs 3 (three) times daily.   Yes [provider]  metoprolol tartrate (LOPRESSOR) 25 MG tablet Take 1 tablet (25 mg total) by mouth 2 (two) times daily. 05/07/17  Yes Altamese Dilling, MD  pantoprazole (PROTONIX) 40 MG tablet Take 1 tablet (40 mg total) by mouth 2 (two) times daily before a meal. Patient taking differently: Take 40 mg by mouth daily.  05/07/17  Yes Altamese Dilling, MD  QUEtiapine (SEROQUEL) 50 MG tablet Take 50 mg by mouth at bedtime.    Yes [provider]  sennosides-docusate sodium (SENOKOT-S) 8.6-50 MG tablet Take 1 tablet by mouth 2 (two) times daily.    Yes [provider]  simvastatin (ZOCOR) 20 MG tablet Take 1 tablet by mouth daily at 8 pm.  04/22/17  Yes [provider]  traMADol (ULTRAM) 50 MG tablet Take 50 mg by mouth 2 (two) times daily.    Yes [provider]  valACYclovir (VALTREX) 500 MG tablet Take 500 mg by mouth 2 (two) times daily.   Yes [provider]  furosemide (LASIX) 20 MG tablet Take 10 mg by mouth daily as needed for edema.    [provider]  hydroxypropyl methylcellulose / hypromellose (ISOPTO TEARS / GONIOVISC) 2.5 % ophthalmic solution Place 1 drop into both eyes 4 (four) times daily as needed for dry eyes.    [provider]  [employer] [facility] EX) Apply 1 application topically daily as needed. Apply to left lower extremities    [provider]      VITAL SIGNS:  Blood pressure (!) 171/73, pulse (!) 147, temperature 98 F (36.7 C), temperature source Oral, resp. rate (!) 30, height 5\' 4"  (1.626 m), weight 57 kg, SpO2 97 %.  PHYSICAL EXAMINATION:  GENERAL:  85 y.o.-year-old patient lying in the bed with no  acute distress.  Appears very frail. EYES: Pupils equal, round, reactive to. No scleral icterus. Extraocular muscles intact.  HEENT: Head atraumatic, normocephalic. Oropharynx and nasopharynx clear.  NECK:  Supple, no jugular venous distention. No thyroid enlargement, no tenderness.  LUNGS: Coarse breath sounds bilaterally CARDIOVASCULAR: S1, S2 normal. No murmurs, rubs, or gallops.  ABDOMEN: Soft, nontender, nondistended. Bowel sounds present. No organomegaly or mass.  EXTREMITIES: No pedal edema, cyanosis, or clubbing.  NEUROLOGIC: Cranial nerves II through XII are intact. Muscle strength 5/5 in all extremities. Sensation intact. Gait not checked.  PSYCHIATRIC: The patient is alert and oriented x 3.  SKIN: No obvious rash, lesion, or ulcer.   LABORATORY PANEL:   CBC Recent Labs  Lab 01/24/18 0616  WBC 26.0*  HGB 8.3*  HCT 29.0*  PLT 224   ------------------------------------------------------------------------------------------------------------------  Chemistries  Recent Labs  Lab 01/24/18 0616  NA 140  K 3.4*  CL 104  CO2 26  GLUCOSE 126*  BUN 18  CREATININE 0.87  CALCIUM 8.6*  AST 24  ALT 14  ALKPHOS 78  BILITOT 0.8   ------------------------------------------------------------------------------------------------------------------  Cardiac Enzymes No results for input(s): TROPONINI in the last 168 hours. ------------------------------------------------------------------------------------------------------------------  RADIOLOGY:  Dg Chest Port 1 View  Result Date: 01/24/2018 CLINICAL DATA:  Fever and cough. EXAM: PORTABLE CHEST 1 VIEW COMPARISON:  Two-view chest x-ray 05/04/2017 FINDINGS: The heart is enlarged. Aortic atherosclerosis is present. Asymmetric right perihilar airspace opacity is noted. Mild pulmonary vascular congestion is present. No other significant airspace disease is present. Remote posterior left rib fractures and multiple spine fractures are  again noted. Remote right humerus fracture is noted. IMPRESSION: 1. Cardiomegaly and pulmonary vascular congestion likely reflects some element of congestive heart failure. 2. Asymmetric right perihilar airspace opacity. The patient has chronic asymmetric right perihilar prominence. This has increased. While this may represent pulmonary vascular congestion, infection is not excluded. Recommend follow-up chest x-ray to assure improvement. Electronically Signed   By: Marin Robertshristopher  Mattern M.D.   On: 01/24/2018 07:09    EKG:   Orders placed or performed during the hospital encounter of 01/24/18  . ED EKG 12-Lead  . ED EKG 12-Lead  . EKG 12-Lead  . EKG 12-Lead  . EKG 12-Lead  . EKG 12-Lead  . EKG 12-Lead  . EKG 12-Lead   Atrial fibrillation with RVR with heart rate 118 bpm on admission. IMPRESSION AND PLAN:   [age] year old patient with hypertension, history of CVA brought in from nursing home because of shortness of breath, cough found to have fever, hypoxia. Assessment and plan: #1 .sepsis present on admission 'evidence of sepsis elect lactic acid,  fever, leukocytosis, tachycardia secondary to pneumonia: Admitted to telemetry, lactic acid is trending down, continue IV Vanco and cefepime, to MRSA screen, urine for strep pneumonia antigen and also check for Legionella.  Continue bronchodilators, IV steroids and see how she does. Mortality rate is very high in elderly female with pneumonia  #2 history of CVA: Stable 3.  Dementia, continue Aricept. 4.  Acute on chronic diastolic heart failure 20 mg IV Lasix twice daily.    All the records are reviewed and case discussed with ED provider. Management plans discussed with the patient, family and they are in agreement.  CODE STATUS: Full code  TOTAL TIME TAKING CARE OF THIS PATIENT: 55 minutes.    Katha HammingSnehalatha Lailany Enoch M.D on 01/24/2018 at 9:48 AM  Between 7am to 6pm - Pager - [phone]  After 6pm go to [email or web address] - password [email or web address]  [facility]  [facility]  Office  [phone]  CC: Primary care physician; Gracelyn NurseJohnston, John D, MD  Note: This dictation was prepared with Dragon dictation along with smaller phrase technology. Any transcriptional errors that result from this process are unintentional.

## 2018-01-24 NOTE — Clinical Social Work Note (Signed)
Clinical Social Work Assessment  Patient Details  Name: Rita Vang MRN: [id number] Date of Birth: [birth year]  Date of referral:  01/24/18               Reason for consult:  Other (Comment Required)(From the [employer])                Permission sought to share information with:  Family Supports Permission granted to share information::  Yes, Verbal Permission Granted  Name::     Son Shona Prevo [phone]  Agency::  [facility]  Relationship::     Contact Information:     Housing/Transportation Living arrangements for the past 2 months:  Assisted Dealer of Information:  Adult Children Patient Interpreter Needed:  None Criminal Activity/Legal Involvement Pertinent to Current Situation/Hospitalization:  No - Comment as needed Significant Relationships:  Adult Children Lives with:  Facility Resident Do you feel safe going back to the place where you live?  Yes Need for family participation in patient care:  Yes (Comment)  Care giving concerns:  Son reports her health has been declining the last year   Office manager / plan: LCSW introduced myself to patient and her son Lorin Picket. She declined to finish her food and fell asleep. Patient did give verbal consent to speak to herself and son. He advised this worker he is a Product manager. He reports she has LB dementia but still has a very pleasant disposition. 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 This patient resided at the [facility] for 2 years. Patient has hired sitters and her son comes in 4x week to assist her with her needs. She uses a wheel chair and transfers from bed to chair to commode to bed. She does need full assistance with her ADL's. Patients son is not certain what his mothers needs are at this time and reported she has had declining health. Son will consider his mothers needs closer to discharge. Called the [facility] patient  Is able to return as per  [place].   Employment status:  Proofreader, Retired Health and safety inspector:  Medicare([employer]) PT Recommendations:  Not assessed at this time Information / Referral to community resources:  Other (Comment Required)(TBD)  Patient/Family's Response to care:  Son is concerned with some SNF as his mother has serious health issues after STR stays.  Patient/Family's Understanding of and Emotional Response to Diagnosis, Current Treatment, and Prognosis:  Patient son has a good understanding of his mothers needs  Emotional Assessment Appearance:  Appears stated age Attitude/Demeanor/Rapport:  Lethargic, Unable to Assess Affect (typically observed):  Unable to Assess Orientation:  Oriented to Self Alcohol / Substance use:  Not Applicable Psych involvement (Current and /or in the community):  No (Comment)  Discharge Needs  Concerns to be addressed:  Care Coordination Readmission within the last 30 days:  No Current discharge risk:  None Barriers to Discharge:  No Barriers Identified   Cheron Schaumann, LCSW 01/24/2018, 3:45 PM

## 2018-01-24 NOTE — Progress Notes (Signed)
LCSW will connect with patients son Lorin Picket to complete assessment today later in afternoon. Nurse will call this worker when son returns [name] resting comfortably at this time.   Aneshia Jacquet LCSW

## 2018-01-24 NOTE — Progress Notes (Signed)
PHARMACY - PHYSICIAN COMMUNICATION CRITICAL VALUE ALERT - BLOOD CULTURE IDENTIFICATION (BCID)  No results found for this or any previous visit.  Lab called to report 2 of 4 bottles (single set) showing staphylococcus species (not aureus), mecA detected.   Name of physician (or Provider) Contacted: Retta MacSh. Patel  Changes to prescribed antibiotics required: None, patient is on cefepime/vancomycin.   Carola FrostNathan A Hisako Bugh, PharmD, BCPS Clinical Pharmacist 01/24/2018  7:27 PM

## 2018-01-24 NOTE — Progress Notes (Signed)
LCSW met with patient and her son. Verbal consent was given to speak to her son to complete this assessment. He informed me that he is her HCPOA. He reports his Mom is at an [place] and he has hired sitters to see to her needs and he attends to his mothers needs most afternoons- early evenings 4x week.  He reported his mother has LB Demensia.  [employer] LCSW [phone]

## 2018-01-24 NOTE — ED Triage Notes (Signed)
Patient brought in by ems from the [facility]. Patient was found with oxygen off with lips blue per ems. When patient was placed back on 2l sats 92%. Axillary temp per ems 100. Ems reports heart rate afib 90-140.

## 2018-01-25 LAB — BASIC METABOLIC PANEL
Anion gap: 9 (ref 5–15)
BUN: 26 mg/dL — ABNORMAL HIGH (ref 8–23)
CO2: 32 mmol/L (ref 22–32)
Calcium: 8.7 mg/dL — ABNORMAL LOW (ref 8.9–10.3)
Chloride: 102 mmol/L (ref 98–111)
Creatinine, Ser: 0.99 mg/dL (ref 0.44–1.00)
GFR calc Af Amer: 58 mL/min — ABNORMAL LOW (ref >60)
GFR calc non Af Amer: 50 mL/min — ABNORMAL LOW (ref >60)
Glucose, Bld: 137 mg/dL — ABNORMAL HIGH (ref 70–99)
Potassium: 3 mmol/L — ABNORMAL LOW (ref 3.5–5.1)
Sodium: 143 mmol/L (ref 135–145)

## 2018-01-25 LAB — CBC
HCT: 28.1 % — ABNORMAL LOW (ref 36.0–46.0)
Hemoglobin: 8 g/dL — ABNORMAL LOW (ref 12.0–15.0)
MCH: 23.1 pg — AB (ref 26.0–34.0)
MCHC: 28.5 g/dL — ABNORMAL LOW (ref 30.0–36.0)
MCV: 81.2 fL (ref 80.0–100.0)
Platelets: 214 10*3/uL (ref 150–400)
RBC: 3.46 MIL/uL — ABNORMAL LOW (ref 3.87–5.11)
RDW: 15.8 % — ABNORMAL HIGH (ref 11.5–15.5)
WBC: 20.5 10*3/uL — ABNORMAL HIGH (ref 4.0–10.5)
nRBC: 0 % (ref 0.0–0.2)

## 2018-01-25 LAB — MRSA PCR SCREENING: MRSA by PCR: POSITIVE — AB

## 2018-01-25 LAB — GLUCOSE, CAPILLARY: Glucose-Capillary: 108 mg/dL — ABNORMAL HIGH (ref 70–99)

## 2018-01-25 MED ORDER — POTASSIUM CHLORIDE CRYS ER 20 MEQ PO TBCR
40.0000 meq | EXTENDED_RELEASE_TABLET | ORAL | Status: AC
  Administered 2018-01-25 (×2): 40 meq via ORAL
  Filled 2018-01-25 (×2): qty 2

## 2018-01-25 MED ORDER — ENOXAPARIN SODIUM 40 MG/0.4ML ~~LOC~~ SOLN
40.0000 mg | SUBCUTANEOUS | Status: DC
  Administered 2018-01-25: 40 mg via SUBCUTANEOUS
  Filled 2018-01-25: qty 0.4

## 2018-01-25 NOTE — Plan of Care (Signed)
  Problem: Clinical Measurements: Goal: Will remain free from infection Outcome: Progressing   Problem: Safety: Goal: Ability to remain free from injury will improve Outcome: Progressing   

## 2018-01-25 NOTE — Progress Notes (Signed)
Enoxaparin dose modified from 30 mg subcutaneously once daily to 40 mg subcutaneously once daily due to CrCl >30 mL/min and ABW >45 kg.  Theodoros Stjames A. Dahlia Bailiff, PharmD, BCPS Clinical Pharmacist 01/25/2018 13:08

## 2018-01-25 NOTE — Progress Notes (Signed)
[facility] - [place] at [facility]   PATIENT NAME: Rita Vang    MR#:  [id number]  DATE OF BIRTH:  23-Oct-1924  SUBJECTIVE: Seen at bedside, admitted for pneumonia today morning she feels much better.  Sat 94% on 2 L.  No fever.  CHIEF COMPLAINT:   Chief Complaint  Patient presents with  . Shortness of Breath    REVIEW OF SYSTEMS:   ROS CONSTITUTIONAL: No fever, fatigue or weakness.  EYES: No blurred or double vision.  EARS, NOSE, AND THROAT: No tinnitus or ear pain.  RESPIRATORY: Has some cough. CARDIOVASCULAR: No chest pain, orthopnea, edema.  GASTROINTESTINAL: No nausea, vomiting, diarrhea or abdominal pain.  GENITOURINARY: No dysuria, hematuria.  ENDOCRINE: No polyuria, nocturia,  HEMATOLOGY: No anemia, easy bruising or bleeding SKIN: No rash or lesion. MUSCULOSKELETAL: No joint pain or arthritis.   NEUROLOGIC: No tingling, numbness, weakness.  PSYCHIATRY: No anxiety or depression.   DRUG ALLERGIES:   Allergies  Allergen Reactions  . Sulfamethoxazole-Trimethoprim Other (See Comments)    Chest pain  . Oxycontin [Oxycodone Hcl] Other (See Comments)    Hallucinations   . Clarithromycin Other (See Comments)    Abdominal pain     VITALS:  Blood pressure (!) 149/57, pulse 68, temperature 98.2 F (36.8 C), temperature source Oral, resp. rate 20, height 5\' 4"  (1.626 m), weight 57.5 kg, SpO2 100 %.  PHYSICAL EXAMINATION:  GENERAL:  85 y.o.-year-old patient lying in the bed with no acute distress.  EYES: Pupils equal, round, reactive to light and accommodation. No scleral icterus. Extraocular muscles intact.  HEENT: Head atraumatic, normocephalic. Oropharynx and nasopharynx clear.  NECK:  Supple, no jugular venous distention. No thyroid enlargement, no tenderness.  LUNGS: Coarse breath sounds bilaterally.  CARDIOVASCULAR: S1, S2 normal. No murmurs, rubs, or gallops.  ABDOMEN: Soft, nontender, nondistended. Bowel sounds present. No  organomegaly or mass.  EXTREMITIES: No pedal edema, cyanosis, or clubbing.  NEUROLOGIC: Cranial nerves II through XII are intact. Muscle strength 5/5 in all extremities. Sensation intact. Gait not checked.  PSYCHIATRIC: The patient is alert and oriented x 3.  SKIN: No obvious rash, lesion, or ulcer.    LABORATORY PANEL:   CBC Recent Labs  Lab 01/25/18 0339  WBC 20.5*  HGB 8.0*  HCT 28.1*  PLT 214   ------------------------------------------------------------------------------------------------------------------  Chemistries  Recent Labs  Lab 01/24/18 0616 01/25/18 0339  NA 140 143  K 3.4* 3.0*  CL 104 102  CO2 26 32  GLUCOSE 126* 137*  BUN 18 26*  CREATININE 0.87 0.99  CALCIUM 8.6* 8.7*  AST 24  --   ALT 14  --   ALKPHOS 78  --   BILITOT 0.8  --    ------------------------------------------------------------------------------------------------------------------  Cardiac Enzymes No results for input(s): TROPONINI in the last 168 hours. ------------------------------------------------------------------------------------------------------------------  RADIOLOGY:  Dg Chest Port 1 View  Result Date: 01/24/2018 CLINICAL DATA:  Fever and cough. EXAM: PORTABLE CHEST 1 VIEW COMPARISON:  Two-view chest x-ray 05/04/2017 FINDINGS: The heart is enlarged. Aortic atherosclerosis is present. Asymmetric right perihilar airspace opacity is noted. Mild pulmonary vascular congestion is present. No other significant airspace disease is present. Remote posterior left rib fractures and multiple spine fractures are again noted. Remote right humerus fracture is noted. IMPRESSION: 1. Cardiomegaly and pulmonary vascular congestion likely reflects some element of congestive heart failure. 2. Asymmetric right perihilar airspace opacity. The patient has chronic asymmetric right perihilar prominence. This has increased. While this may represent pulmonary vascular congestion, infection is not  excluded. Recommend follow-up chest x-ray to assure improvement. Electronically Signed   By: Marin Roberts M.D.   On: 01/24/2018 07:09    EKG:   Orders placed or performed during the hospital encounter of 01/24/18  . ED EKG 12-Lead  . ED EKG 12-Lead  . EKG 12-Lead  . EKG 12-Lead  . EKG 12-Lead  . EKG 12-Lead  . EKG 12-Lead  . EKG 12-Lead    ASSESSMENT AND PLAN:    85 year old female patient with sepsis on admission secondary to pneumonia, clinically improving, continue Vanco and Zosyn, bronchodilators, IV steroids, clinically improving.  Leukocytosis also improved, WBC down from 26-20  #2.  Hypokalemia, replace potassium. 3.  Acute on chronic diastolic heart failure, continue IV Lasix #4 GERD: Continue PPI Out of bed to chair today.  All the records are reviewed and case discussed with Care Management/Social Workerr. Management plans discussed with the patient, family and they are in agreement.  CODE STATUS: Full code  TOTAL TIME TAKING CARE OF THIS PATIENT: 38 minutes.   POSSIBLE D/C IN 1-2DAYS, DEPENDING ON CLINICAL CONDITION.   Katha Hamming M.D on 01/25/2018 at 11:48 AM  Between 7am to 6pm - Pager - [phone]  After 6pm go to [email or web address] - password [email or web address] [facility]  [place] [facility]  Office  [phone]  CC: Primary care physician; Gracelyn Nurse, MD   Note: This dictation was prepared with Dragon dictation along with smaller phrase technology. Any transcriptional errors that result from this process are unintentional.

## 2018-01-26 ENCOUNTER — Other Ambulatory Visit: Payer: Self-pay

## 2018-01-26 LAB — BASIC METABOLIC PANEL
Anion gap: 11 (ref 5–15)
BUN: 42 mg/dL — ABNORMAL HIGH (ref 8–23)
CO2: 31 mmol/L (ref 22–32)
Calcium: 8.5 mg/dL — ABNORMAL LOW (ref 8.9–10.3)
Chloride: 97 mmol/L — ABNORMAL LOW (ref 98–111)
Creatinine, Ser: 1.1 mg/dL — ABNORMAL HIGH (ref 0.44–1.00)
GFR calc Af Amer: 51 mL/min — ABNORMAL LOW (ref >60)
GFR calc non Af Amer: 44 mL/min — ABNORMAL LOW (ref >60)
GLUCOSE: 135 mg/dL — AB (ref 70–99)
Potassium: 3.3 mmol/L — ABNORMAL LOW (ref 3.5–5.1)
Sodium: 139 mmol/L (ref 135–145)

## 2018-01-26 LAB — CBC
HCT: 35.6 % — ABNORMAL LOW (ref 36.0–46.0)
Hemoglobin: 10.2 g/dL — ABNORMAL LOW (ref 12.0–15.0)
MCH: 23 pg — ABNORMAL LOW (ref 26.0–34.0)
MCHC: 28.7 g/dL — ABNORMAL LOW (ref 30.0–36.0)
MCV: 80.2 fL (ref 80.0–100.0)
Platelets: 322 10*3/uL (ref 150–400)
RBC: 4.44 MIL/uL (ref 3.87–5.11)
RDW: 16 % — ABNORMAL HIGH (ref 11.5–15.5)
WBC: 30.7 10*3/uL — ABNORMAL HIGH (ref 4.0–10.5)
nRBC: 0 % (ref 0.0–0.2)

## 2018-01-26 LAB — LEGIONELLA PNEUMOPHILA SEROGP 1 UR AG: L. PNEUMOPHILA SEROGP 1 UR AG: NEGATIVE

## 2018-01-26 LAB — GLUCOSE, CAPILLARY: Glucose-Capillary: 97 mg/dL (ref 70–99)

## 2018-01-26 MED ORDER — CHLORHEXIDINE GLUCONATE CLOTH 2 % EX PADS
6.0000 | MEDICATED_PAD | Freq: Every day | CUTANEOUS | Status: AC
  Administered 2018-01-27 – 2018-01-30 (×4): 6 via TOPICAL

## 2018-01-26 MED ORDER — METHYLPREDNISOLONE SODIUM SUCC 40 MG IJ SOLR
40.0000 mg | Freq: Two times a day (BID) | INTRAMUSCULAR | Status: DC
  Administered 2018-01-26 – 2018-01-29 (×6): 40 mg via INTRAVENOUS
  Filled 2018-01-26 (×6): qty 1

## 2018-01-26 MED ORDER — ENOXAPARIN SODIUM 30 MG/0.3ML ~~LOC~~ SOLN
30.0000 mg | SUBCUTANEOUS | Status: DC
  Administered 2018-01-26 – 2018-01-30 (×5): 30 mg via SUBCUTANEOUS
  Filled 2018-01-26 (×5): qty 0.3

## 2018-01-26 MED ORDER — METOPROLOL TARTRATE 5 MG/5ML IV SOLN
2.5000 mg | Freq: Once | INTRAVENOUS | Status: AC
  Administered 2018-01-26: 2.5 mg via INTRAVENOUS
  Filled 2018-01-26: qty 5

## 2018-01-26 MED ORDER — MUPIROCIN 2 % EX OINT
1.0000 "application " | TOPICAL_OINTMENT | Freq: Two times a day (BID) | CUTANEOUS | Status: AC
  Administered 2018-01-26 – 2018-01-30 (×10): 1 via NASAL
  Filled 2018-01-26 (×2): qty 22

## 2018-01-26 MED ORDER — VANCOMYCIN HCL IN DEXTROSE 1-5 GM/200ML-% IV SOLN
1000.0000 mg | INTRAVENOUS | Status: AC
  Administered 2018-01-27: 1000 mg via INTRAVENOUS
  Filled 2018-01-26: qty 200

## 2018-01-26 NOTE — Progress Notes (Signed)
Spoke to patient son

## 2018-01-26 NOTE — Care Management Note (Signed)
Case Management Note  Patient Details  Name: Rita Vang MRN: [id number] Date of Birth: 1924-09-06  Subjective/Objective:      [name] a resident from [facility].  Admitted with acute on chronic CHF; PNA. Oriented to self only.  Chronic 2L  oxygen.  Has used [facility] for home health services in the past.  Called the [facility] for home health history and who they use.   Referral made to [facility] and accepted.  Will notify [facility] at discharge.                 Action/Plan:   Expected Discharge Date:                  Expected Discharge Plan:  Home/Self Care  In-House Referral:     Discharge planning Services  CM Consult  Post Acute Care Choice:    Choice offered to:  Patient  DME Arranged:    DME Agency:     HH Arranged:  RN, PT HH Agency:  [facility]  Status of Service:  Completed, signed off  If discussed at Long Length of Stay Meetings, dates discussed:    Additional Comments:  Sherren Kerns, RN 01/26/2018, 3:10 PM

## 2018-01-26 NOTE — Evaluation (Signed)
Physical Therapy Evaluation Patient Details Name: Rita Vang MRN: [id number] DOB: 1924/05/25 Today's Date: 01/26/2018   History of Present Illness  85 y.o. female with a known history of HTN, HLD, CVA, depression, GERD brought in from [place] nursing facility because of loss of breath, cough, noted to be hypoxic on baseline oxygen 2 L. Workup showed PNA.     Clinical Impression  Pt able to stated her name, disoriented to birthday, situation, place, time. Friend at bedside to confirm PLOF, stated pt lives in [facility], where she is currently dependent for ADLs. Per friend, pt is able to transfer from bed to chair with assistance with 1 person, and able to put weight through her feet, unsure if any AD is used for transfer at this time.   Pt on 2L O2 via [place] through session. 1 instance of desatting to mid 80s sitting EOB, able to recover with time, cues for posture and deep breaths. Mobilized to EOB with maxAx1, unable to sit without UE support, CGA for safety. MaxA1 squat pivot to chair, pt at this time unable to successfully bear weight through LE, but eager to assist (able to begin to initiate weight shift, reaches for PT). The patient was up in the chair with lab tech entering room at end of session.  Overall the patient demonstrated deficits (see "PT Problem List") that differ from the the patient's PLOF and would benefit from skilled PT intervention. Recommendation is HHPT in ALF, with supervision intermittently and for mobility/OOB/ADLs/IADLs.     Follow Up Recommendations Home health PT;Supervision for mobility/OOB;Supervision - Intermittent    Equipment Recommendations  None recommended by PT    Recommendations for Other Services       Precautions / Restrictions Precautions Precautions: Fall Restrictions Weight Bearing Restrictions: No      Mobility  Bed Mobility Overal bed mobility: Needs Assistance Bed Mobility: Supine to Sit     Supine to sit: Max assist     General  bed mobility comments: Assistance needed to scoot laterally in supine, as well as anteriorly in sitting. Able to sit EOB with CGA with UE support  Transfers Overall transfer level: Needs assistance   Transfers: Squat Pivot Transfers     Squat pivot transfers: Max assist     General transfer comment: Pt able to reach for PT, to attempt to assist by shifting weight anteriorly.  Ambulation/Gait                Stairs            Wheelchair Mobility    Modified Rankin (Stroke Patients Only)       Balance Overall balance assessment: Needs assistance Sitting-balance support: Feet supported;Bilateral upper extremity supported Sitting balance-Leahy Scale: Poor   Postural control: Posterior lean   Standing balance-Leahy Scale: Zero                               Pertinent Vitals/Pain Pain Assessment: Faces Faces Pain Scale: Hurts little more(grimaces/moans with pain during moving, but able to redirect)    Home Living Family/patient expects to be discharged to:: Assisted living                 Additional Comments: Per previous documentation[place] [facility] [place] [facility] [place]    Prior Function Level of Independence: Needs assistance         Comments: friend at bedside to confirm PLOF: Pt able to transfer  to [facility] at baseline with assistance, stated previously she was able to stand and transfer with 1 person. Helps use legs to mobilize in [facility]. No falls     Hand Dominance        Extremity/Trunk Assessment   Upper Extremity Assessment Upper Extremity Assessment: Generalized weakness    Lower Extremity Assessment Lower Extremity Assessment: Generalized weakness    Cervical / Trunk Assessment Cervical / Trunk Assessment: Kyphotic  Communication   Communication: HOH  Cognition Arousal/Alertness: Awake/alert Behavior During Therapy: WFL for tasks assessed/performed Overall Cognitive Status: Within Functional Limits for tasks assessed                                         General Comments      Exercises     Assessment/Plan    PT Assessment Patient needs continued PT services  PT Problem List Decreased strength;Decreased activity tolerance;Decreased balance;Decreased mobility       PT Treatment Interventions DME instruction;Therapeutic exercise;Gait training;Balance training;Stair training;Neuromuscular re-education;Functional mobility training;Therapeutic activities;Patient/family education    PT Goals (Current goals can be found in the Care Plan section)  Acute Rehab PT Goals Patient Stated Goal: Pt wants to feel better PT Goal Formulation: With patient Time For Goal Achievement: 02/09/18 Potential to Achieve Goals: Good    Frequency Min 2X/week   Barriers to discharge        Co-evaluation               AM-PAC PT "6 Clicks" Mobility  Outcome Measure Help needed turning from your back to your side while in a flat bed without using bedrails?: A Lot Help needed moving from lying on your back to sitting on the side of a flat bed without using bedrails?: A Lot Help needed moving to and from a bed to a chair (including a wheelchair)?: A Lot Help needed standing up from a chair using your arms (e.g., wheelchair or bedside chair)?: Total Help needed to walk in hospital room?: Total Help needed climbing 3-5 steps with a railing? : Total 6 Click Score: 9    End of Session Equipment Utilized During Treatment: Gait belt Activity Tolerance: Patient tolerated treatment well;Patient limited by fatigue Patient left: in chair;with chair alarm set;with family/visitor present;with call bell/phone within reach;Other (comment)(lab tech entering room) Nurse Communication: Mobility status PT Visit Diagnosis: Other abnormalities of gait and mobility (R26.89);Muscle weakness (generalized) (M62.81)    Time: 8315-1761 PT Time Calculation (min) (ACUTE ONLY): 46 min   Charges:   PT Evaluation $PT  Eval Moderate Complexity: 1 Mod PT Treatments $Therapeutic Activity: 8-22 mins        Olga Coaster PT, DPT 12:07 PM,01/26/18 [phone]

## 2018-01-26 NOTE — Progress Notes (Signed)
Pt HR afib sustaining in 110's to 130's MD on call made aware one time dose of IV metoprolol 2.5 mg to be ordered.

## 2018-01-26 NOTE — Progress Notes (Addendum)
Pharmacy Antibiotic Note  Rita Vang is a 85 y.o. female admitted on 01/24/2018 with pneumonia.  Pharmacy has been consulted for vancomycin dosing.  Plan: Originally Vancomycin 1000 mg IV Q 36 starting 1/12 at 2000. Goal AUC 400-550. Expected AUC: 469.7 with SCr used: 0.87  With Scr bump to 1.1, will adjust dosing to 1000mg  q 48hrs with an expected AUC of 430  Zosyn 3.375 g IV q8hr extended infusion  Height: 5\' 4"  (162.6 cm) Weight: 125 lb 14.8 oz (57.1 kg) IBW/kg (Calculated) : 54.7  Temp (24hrs), Avg:98.4 F (36.9 C), Min:98.1 F (36.7 C), Max:98.6 F (37 C)  Recent Labs  Lab 01/24/18 0616 01/24/18 0626 01/24/18 0833 01/25/18 0339 01/26/18 0011  WBC 26.0*  --   --  20.5*  --   CREATININE 0.87  --   --  0.99 1.10*  LATICACIDVEN  --  2.11* 1.67  --   --     Estimated Creatinine Clearance: 28.8 mL/min (A) (by C-G formula based on SCr of 1.1 mg/dL (H)).    Allergies  Allergen Reactions  . Sulfamethoxazole-Trimethoprim Other (See Comments)    Chest pain  . Oxycontin [Oxycodone Hcl] Other (See Comments)    Hallucinations   . Clarithromycin Other (See Comments)    Abdominal pain     Antimicrobials this admission: Cefepime 1/11 x 1 dose Vancomycin 1/11 >>  Zosyn 1/11 >>  Dose adjustments this admission: 1/13 Changed Vancomycin 1000mg  q36hr to 1000 q48hr per Scr increase from 0.87 to 1.1  Microbiology results: 1/11 BCx: Coag Neg Staph Species 2/4 bottles - Mech A detected (per BCID) 1/11 MRSA PCR: positive  Thank you for allowing pharmacy to be a part of this patient's care.  Albina Billet, PharmD, BCPS Clinical Pharmacist 01/26/2018 9:19 AM

## 2018-01-26 NOTE — Progress Notes (Addendum)
[facility] [place] [facility]   PATIENT NAME: Rita BlueFrances Donate    MR#:  [id number]  DATE OF BIRTH:  02/01/1924  SUBJECTIVE:   CHIEF COMPLAINT:   Chief Complaint  Patient presents with  . Shortness of Breath    REVIEW OF SYSTEMS:   ROS CONSTITUTIONAL: No fever, fatigue or weakness.  EYES: No blurred or double vision.  EARS, NOSE, AND THROAT: No tinnitus or ear pain.  RESPIRATORY: Has some cough. CARDIOVASCULAR: No chest pain, orthopnea, edema.  GASTROINTESTINAL: No nausea, vomiting, diarrhea or abdominal pain.  GENITOURINARY: No dysuria, hematuria.  ENDOCRINE: No polyuria, nocturia,  HEMATOLOGY: No anemia, easy bruising or bleeding SKIN: No rash or lesion. MUSCULOSKELETAL: No joint pain or arthritis.   NEUROLOGIC: No tingling, numbness, weakness.  PSYCHIATRY: No anxiety or depression.   DRUG ALLERGIES:   Allergies  Allergen Reactions  . Sulfamethoxazole-Trimethoprim Other (See Comments)    Chest pain  . Oxycontin [Oxycodone Hcl] Other (See Comments)    Hallucinations   . Clarithromycin Other (See Comments)    Abdominal pain     VITALS:  Blood pressure (!) 169/79, pulse 86, temperature 98.6 F (37 C), temperature source Oral, resp. rate 19, height 5\' 4"  (1.626 m), weight 57.1 kg, SpO2 96 %.  PHYSICAL EXAMINATION:  GENERAL:  85 y.o.-year-old patient lying in the bed with no acute distress.  EYES: Pupils equal, round, reactive to light and accommodation. No scleral icterus. Extraocular muscles intact.  HEENT: Head atraumatic, normocephalic. Oropharynx and nasopharynx clear.  NECK:  Supple, no jugular venous distention. No thyroid enlargement, no tenderness.  LUNGS: Coarse breath sounds bilaterally.  CARDIOVASCULAR: S1, S2 normal. No murmurs, rubs, or gallops.  ABDOMEN: Soft, nontender, nondistended. Bowel sounds present. No organomegaly or mass.  EXTREMITIES: No pedal edema, cyanosis, or clubbing.  NEUROLOGIC: Cranial nerves II through  XII are intact. Muscle strength 5/5 in all extremities. Sensation intact. Gait not checked.  PSYCHIATRIC: The patient is alert and oriented x 3.  SKIN: No obvious rash, lesion, or ulcer.    LABORATORY PANEL:   CBC Recent Labs  Lab 01/25/18 0339  WBC 20.5*  HGB 8.0*  HCT 28.1*  PLT 214   ------------------------------------------------------------------------------------------------------------------  Chemistries  Recent Labs  Lab 01/24/18 0616  01/26/18 0011  NA 140   < > 139  K 3.4*   < > 3.3*  CL 104   < > 97*  CO2 26   < > 31  GLUCOSE 126*   < > 135*  BUN 18   < > 42*  CREATININE 0.87   < > 1.10*  CALCIUM 8.6*   < > 8.5*  AST 24  --   --   ALT 14  --   --   ALKPHOS 78  --   --   BILITOT 0.8  --   --    < > = values in this interval not displayed.   ------------------------------------------------------------------------------------------------------------------  Cardiac Enzymes No results for input(s): TROPONINI in the last 168 hours. ------------------------------------------------------------------------------------------------------------------  RADIOLOGY:  No results found.  EKG:   Orders placed or performed during the hospital encounter of 01/24/18  . ED EKG 12-Lead  . ED EKG 12-Lead  . EKG 12-Lead  . EKG 12-Lead  . EKG 12-Lead  . EKG 12-Lead  . EKG 12-Lead  . EKG 12-Lead    ASSESSMENT AND PLAN:    85 year old female patient with sepsis on admission secondary to pneumonia, clinically improving, continue Vanco and Zosyn, bronchodilators, IV steroids,  clinically improving.     Can discontinue telemetry.  Continue strict aspiration precautions, will get speech therapy evaluation also today, repeat chest x-ray.  Patient still has coarse breath sounds, continue bronchodilators, IV steroids, IV Lasix today. Patient MRSA screen is positive. so she is on contact precautions.   #2.  Hypokalemia, replace potassium.,  Potassium is better. 3.  Acute on  chronic diastolic heart failure, continue IV Lasix #4 GERD: Continue PPI Out of bed to chair today.  Physical therapy evaluation, discontinue telemetry, possible discharge in next 1 to 2 days.  All the records are reviewed and case discussed with Care Management/Social Workerr. Management plans discussed with the patient, family and they are in agreement.  CODE STATUS: Full code  TOTAL TIME TAKING CARE OF THIS PATIENT: 38 minutes.   POSSIBLE D/C IN 1-2DAYS, DEPENDING ON CLINICAL CONDITION.   Katha Hamming M.D on 01/26/2018 at 9:50 AM  Between 7am to 6pm - Pager - [phone]  After 6pm go to [email or web address] - password [email or web address] [facility]  [place] [facility]  Office  [phone]  CC: Primary care physician; Gracelyn Nurse, MD   Note: This dictation was prepared with Dragon dictation along with smaller phrase technology. Any transcriptional errors that result from this process are unintentional.

## 2018-01-26 NOTE — Progress Notes (Signed)
PHARMACIST - PHYSICIAN COMMUNICATION  CONCERNING:  Enoxaparin (Lovenox) for DVT Prophylaxis    RECOMMENDATION: Patient was ordered enoxaprin 40mg  q24 hours for VTE prophylaxis.   Filed Weights   01/24/18 0849 01/25/18 0444 01/26/18 0350  Weight: 125 lb 9.6 oz (57 kg) 126 lb 11.2 oz (57.5 kg) 125 lb 14.8 oz (57.1 kg)    Body mass index is 21.62 kg/m.  Estimated Creatinine Clearance: 28.8 mL/min (A) (by C-G formula based on SCr of 1.1 mg/dL (H)).  [name] candidate for enoxaparin 30mg  every 24 hours based on CrCl <21ml/min   DESCRIPTION: Pharmacy has adjusted enoxaparin dose per [facility] policy.  [name] now receiving enoxaparin 30mg  every 24 hours.  Albina Billet, PharmD, BCPS Clinical Pharmacist 01/26/2018 9:24 AM

## 2018-01-26 NOTE — NC FL2 (Signed)
[place] MEDICAID FL2 LEVEL OF CARE SCREENING TOOL     IDENTIFICATION  Patient Name: Rita Vang Birthdate: 1924/06/11 Sex: female Admission Date (Current Location): 01/24/2018  [facility] and [place] Number:  Chiropodist and Address:  [facility], [address] [place], [place] [address]      Provider Number: [id number]  Attending Physician Name and Address:  Katha Hamming, MD  Relative Name and Phone Number:       Current Level of Care: Hospital Recommended Level of Care: Assisted Living Facility Prior Approval Number:    Date Approved/Denied:   PASRR Number:    Discharge Plan: Domiciliary (Rest home)    Current Diagnoses: Patient Active Problem List   Diagnosis Date Noted  . TIA (transient ischemic attack) 06/17/2017  . Palliative care by specialist   . DNR (do not resuscitate) discussion   . Lewy body dementia without behavioral disturbance (HCC)   . Upper GI bleed 05/01/2017  . Acute gastrointestinal hemorrhage   . Gastric irritation   . Herpes genitalis 04/04/2017  . Protein-calorie malnutrition (HCC) 04/04/2017  . Hypomagnesemia 04/04/2017  . Vitamin B12 deficiency 04/04/2017  . Generalized weakness 04/01/2017  . Pressure injury of skin 03/07/2017  . Sepsis (HCC) 03/06/2017  . CAP (community acquired pneumonia) 03/06/2017  . HTN (hypertension) 03/06/2017  . Anxiety 03/06/2017  . HLD (hyperlipidemia) 03/06/2017  . GERD (gastroesophageal reflux disease) 03/06/2017  . CKD (chronic kidney disease), stage III (HCC) 03/06/2017  . CVA (cerebral infarction) 08/23/2014    Orientation RESPIRATION BLADDER Height & Weight     Self  O2(2 liters ) Incontinent Weight: 125 lb 14.8 oz (57.1 kg) Height:  5\' 4"  (162.6 cm)  BEHAVIORAL SYMPTOMS/MOOD NEUROLOGICAL BOWEL NUTRITION STATUS  (none) (none) Incontinent Diet(Dysphagia 1 )  AMBULATORY STATUS COMMUNICATION OF NEEDS Skin   Supervision Verbally Normal                      Personal Care Assistance Level of Assistance  Bathing, Feeding, Dressing Bathing Assistance: Limited assistance Feeding assistance: Independent Dressing Assistance: Limited assistance     Functional Limitations Info  Hearing, Sight, Speech Sight Info: Adequate Hearing Info: Adequate Speech Info: Adequate    SPECIAL CARE FACTORS FREQUENCY                       Contractures Contractures Info: Not present    Additional Factors Info  Code Status, Allergies Code Status Info: Full Code  Allergies Info: Sulfamethoxazole-trimethoprim, Oxycontin Oxycodone Hcl, Clarithromycin           Current Medications (01/26/2018):  This is the current hospital active medication list Current Facility-Administered Medications  Medication Dose Route Frequency Provider Last Rate Last Dose  . 0.9 %  sodium chloride infusion   Intravenous PRN Darci Current, MD 10 mL/hr at 01/24/18 0730    . 0.9 %  sodium chloride infusion   Intravenous PRN Darci Current, MD   Stopped at 01/24/18 1308  . acetaminophen (TYLENOL) tablet 650 mg  650 mg Oral Q6H PRN Katha Hamming, MD       Or  . acetaminophen (TYLENOL) suppository 650 mg  650 mg Rectal Q6H PRN Katha Hamming, MD      . aspirin EC tablet 81 mg  81 mg Oral Daily Katha Hamming, MD   81 mg at 01/26/18 0918  . bisacodyl (DULCOLAX) EC tablet 5 mg  5 mg Oral Daily PRN Katha Hamming, MD      .  Chlorhexidine Gluconate Cloth 2 % PADS 6 each  6 each Topical Q0600 Katha HammingKonidena, Snehalatha, MD      . citalopram (CELEXA) tablet 20 mg  20 mg Oral Daily Katha HammingKonidena, Snehalatha, MD   20 mg at 01/26/18 0918  . docusate sodium (COLACE) capsule 100 mg  100 mg Oral BID Katha HammingKonidena, Snehalatha, MD   100 mg at 01/25/18 0957  . donepezil (ARICEPT) tablet 10 mg  10 mg Oral QHS Katha HammingKonidena, Snehalatha, MD   10 mg at 01/25/18 2119  . enoxaparin (LOVENOX) injection 30 mg  30 mg Subcutaneous Q24H Albina BilletShanlever, Charles M, RPH      . fluticasone  (FLONASE) 50 MCG/ACT nasal spray 1 spray  1 spray Each Nare Daily Katha HammingKonidena, Snehalatha, MD   1 spray at 01/26/18 0919  . furosemide (LASIX) injection 40 mg  40 mg Intravenous Q12H Katha HammingKonidena, Snehalatha, MD   40 mg at 01/26/18 0816  . galantamine (RAZADYNE) tablet 4 mg  4 mg Oral BID WC Katha HammingKonidena, Snehalatha, MD   4 mg at 01/26/18 0918  . ipratropium (ATROVENT) nebulizer solution 0.5 mg  0.5 mg Inhalation TID Katha HammingKonidena, Snehalatha, MD   0.5 mg at 01/26/18 1434  . methylPREDNISolone sodium succinate (SOLU-MEDROL) 40 mg/mL injection 40 mg  40 mg Intravenous Q12H Katha HammingKonidena, Snehalatha, MD      . metoprolol tartrate (LOPRESSOR) tablet 25 mg  25 mg Oral BID Katha HammingKonidena, Snehalatha, MD   25 mg at 01/26/18 0919  . mupirocin ointment (BACTROBAN) 2 % 1 application  1 application Nasal BID Katha HammingKonidena, Snehalatha, MD      . ondansetron (ZOFRAN) tablet 4 mg  4 mg Oral Q6H PRN Katha HammingKonidena, Snehalatha, MD       Or  . ondansetron (ZOFRAN) injection 4 mg  4 mg Intravenous Q6H PRN Katha HammingKonidena, Snehalatha, MD      . pantoprazole (PROTONIX) EC tablet 40 mg  40 mg Oral BID AC Katha HammingKonidena, Snehalatha, MD   40 mg at 01/26/18 0919  . piperacillin-tazobactam (ZOSYN) IVPB 3.375 g  3.375 g Intravenous Q8H Katha HammingKonidena, Snehalatha, MD 12.5 mL/hr at 01/26/18 0817 3.375 g at 01/26/18 0817  . QUEtiapine (SEROQUEL) tablet 50 mg  50 mg Oral QHS Katha HammingKonidena, Snehalatha, MD   50 mg at 01/25/18 2118  . simvastatin (ZOCOR) tablet 20 mg  20 mg Oral q1800 Katha HammingKonidena, Snehalatha, MD   20 mg at 01/25/18 1726  . traZODone (DESYREL) tablet 25 mg  25 mg Oral QHS PRN Katha HammingKonidena, Snehalatha, MD   25 mg at 01/25/18 2118  . valACYclovir (VALTREX) tablet 500 mg  500 mg Oral BID Katha HammingKonidena, Snehalatha, MD   500 mg at 01/26/18 0918  . [START ON 01/27/2018] vancomycin (VANCOCIN) IVPB 1000 mg/200 mL premix  1,000 mg Intravenous Q48H Shanlever, Charmayne Sheerharles M, [facility] QueensRPH         Discharge Medications: Please see discharge summary for a list of discharge medications.  Relevant Imaging  Results:  Relevant Lab Results:   Additional Information    Dalylah Ramey  Rinaldo RatelGarrison, [address]CSWA

## 2018-01-27 ENCOUNTER — Inpatient Hospital Stay: Payer: Medicare Other

## 2018-01-27 LAB — GLUCOSE, CAPILLARY: Glucose-Capillary: 108 mg/dL — ABNORMAL HIGH (ref 70–99)

## 2018-01-27 LAB — HIV ANTIBODY (ROUTINE TESTING W REFLEX): HIV Screen 4th Generation wRfx: NONREACTIVE

## 2018-01-27 LAB — CREATININE, SERUM
Creatinine, Ser: 1.26 mg/dL — ABNORMAL HIGH (ref 0.44–1.00)
GFR calc Af Amer: 43 mL/min — ABNORMAL LOW (ref >60)
GFR calc non Af Amer: 37 mL/min — ABNORMAL LOW (ref >60)

## 2018-01-27 LAB — CULTURE, BLOOD (ROUTINE X 2): SPECIAL REQUESTS: ADEQUATE

## 2018-01-27 MED ORDER — IPRATROPIUM-ALBUTEROL 0.5-2.5 (3) MG/3ML IN SOLN
3.0000 mL | Freq: Two times a day (BID) | RESPIRATORY_TRACT | Status: DC
  Administered 2018-01-27 – 2018-01-28 (×2): 3 mL via RESPIRATORY_TRACT
  Filled 2018-01-27 (×2): qty 3

## 2018-01-27 MED ORDER — SODIUM CHLORIDE 0.9% FLUSH
3.0000 mL | Freq: Two times a day (BID) | INTRAVENOUS | Status: DC
  Administered 2018-01-27 – 2018-01-30 (×7): 3 mL via INTRAVENOUS

## 2018-01-27 MED ORDER — IPRATROPIUM BROMIDE 0.02 % IN SOLN: 0.5000 mg | Freq: Four times a day (QID) | RESPIRATORY_TRACT | Status: DC | PRN

## 2018-01-27 MED ORDER — SODIUM CHLORIDE 0.9% FLUSH: 3.0000 mL | INTRAVENOUS | Status: DC | PRN

## 2018-01-27 NOTE — Progress Notes (Signed)
[facility] [place] at [facility]   PATIENT NAME: Rita Vang    MR#:  [id number]  DATE OF BIRTH:  08/26/1924  SUBJECTIVE: Seen at bedside, no worsening of shortness of breath.  CHIEF COMPLAINT:   Chief Complaint  Patient presents with  . Shortness of Breath    REVIEW OF SYSTEMS:   Review of Systems  Constitutional: Negative for chills and fever.  HENT: Negative for hearing loss.   Eyes: Negative for blurred vision, double vision and photophobia.  Respiratory: Positive for shortness of breath. Negative for cough and hemoptysis.   Cardiovascular: Negative for palpitations, orthopnea and leg swelling.  Gastrointestinal: Negative for abdominal pain, diarrhea and vomiting.  Genitourinary: Negative for dysuria and urgency.  Musculoskeletal: Negative for myalgias and neck pain.  Skin: Negative for rash.  Neurological: Negative for dizziness, focal weakness, seizures, weakness and headaches.  Psychiatric/Behavioral: Negative for memory loss. The patient does not have insomnia.      DRUG ALLERGIES:   Allergies  Allergen Reactions  . Sulfamethoxazole-Trimethoprim Other (See Comments)    Chest pain  . Oxycontin [Oxycodone Hcl] Other (See Comments)    Hallucinations   . Clarithromycin Other (See Comments)    Abdominal pain     VITALS:  Blood pressure 132/70, pulse (!) 113, temperature 97.7 F (36.5 C), temperature source Oral, resp. rate 18, height 5\' 4"  (1.626 m), weight 54.4 kg, SpO2 94 %.  PHYSICAL EXAMINATION:  GENERAL:  85 y.o.-year-old patient lying in the bed with no acute distress.  EYES: Pupils equal, round, reactive to light and accommodation. No scleral icterus. Extraocular muscles intact.  HEENT: Head atraumatic, normocephalic. Oropharynx and nasopharynx clear.  NECK:  Supple, no jugular venous distention. No thyroid enlargement, no tenderness.  LUNGS: Diminished air entry bilaterally.  CARDIOVASCULAR: S1, S2 normal. No murmurs,  rubs, or gallops.  ABDOMEN: Soft, nontender, nondistended. Bowel sounds present. No organomegaly or mass.  EXTREMITIES: No pedal edema, cyanosis, or clubbing.  NEUROLOGIC: Cranial nerves II through XII are intact. Muscle strength 5/5 in all extremities. Sensation intact. Gait not checked.  PSYCHIATRIC: The patient is alert and oriented x 3.  SKIN: No obvious rash, lesion, or ulcer.    LABORATORY PANEL:   CBC Recent Labs  Lab 01/26/18 1009  WBC 30.7*  HGB 10.2*  HCT 35.6*  PLT 322   ------------------------------------------------------------------------------------------------------------------  Chemistries  Recent Labs  Lab 01/24/18 0616  01/26/18 0011 01/27/18 0837  NA 140   < > 139  --   K 3.4*   < > 3.3*  --   CL 104   < > 97*  --   CO2 26   < > 31  --   GLUCOSE 126*   < > 135*  --   BUN 18   < > 42*  --   CREATININE 0.87   < > 1.10* 1.26*  CALCIUM 8.6*   < > 8.5*  --   AST 24  --   --   --   ALT 14  --   --   --   ALKPHOS 78  --   --   --   BILITOT 0.8  --   --   --    < > = values in this interval not displayed.   ------------------------------------------------------------------------------------------------------------------  Cardiac Enzymes No results for input(s): TROPONINI in the last 168 hours. ------------------------------------------------------------------------------------------------------------------  RADIOLOGY:  Dg Chest Port 1 View  Result Date: 01/27/2018 CLINICAL DATA:  Pneumonia. EXAM: PORTABLE CHEST 1  VIEW COMPARISON:  Radiograph of January 24, 2018. FINDINGS: Stable cardiomegaly. Atherosclerosis of thoracic aorta is noted. No pneumothorax is noted. Old left rib fractures are noted. Stable central pulmonary vascular congestion is noted with possible bilateral pulmonary edema. No significant pleural effusion is noted. IMPRESSION: Stable cardiomegaly and central pulmonary vascular congestion is noted with possible bilateral pulmonary edema.  Aortic Atherosclerosis (ICD10-I70.0). Electronically Signed   By: Lupita Raider, M.D.   On: 01/27/2018 09:06    EKG:   Orders placed or performed during the hospital encounter of 01/24/18  . ED EKG 12-Lead  . ED EKG 12-Lead  . EKG 12-Lead  . EKG 12-Lead  . EKG 12-Lead  . EKG 12-Lead  . EKG 12-Lead  . EKG 12-Lead    ASSESSMENT AND PLAN:    85 year old female patient with sepsis on admission secondary to pneumonia, clinically improving, continue Vanco and Zosyn, bronchodilators, IV steroids, clinically improving, Patient MRSA screen is positive. so she is on contact precautions.  Chest x-ray from yesterday showed pulmonary edema.  Leukocytosis worse because of steroids. #2.  Hypokalemia, replace potassium.,  Potassium is better. 3.  Acute on chronic diastolic heart failure, continue IV Lasix, continue IV Lasix because of pulmonary edema. #4 GERD: Continue PPI And transfer the patient from [facility] to MedSurg bed.  All the records are reviewed and case discussed with Care Management/Social Workerr. Management plans discussed with the patient, family and they are in agreement.  CODE STATUS: Full code  TOTAL TIME TAKING CARE OF THIS PATIENT: 38 minutes.   POSSIBLE D/C IN 1-2DAYS, DEPENDING ON CLINICAL CONDITION.   Katha Hamming M.D on 01/27/2018 at 1:43 PM  Between 7am to 6pm - Pager - [phone]  After 6pm go to [email or web address] - password [email or web address] [facility]  [place] [facility]  Office  [phone]  CC: Primary care physician; Gracelyn Nurse, MD   Note: This dictation was prepared with Dragon dictation along with smaller phrase technology. Any transcriptional errors that result from this process are unintentional.

## 2018-01-27 NOTE — Plan of Care (Signed)
Up in the chair for several hours

## 2018-01-27 NOTE — Progress Notes (Signed)
Physical Therapy Treatment Patient Details Name: Rita Vang MRN: [id number] DOB: Feb 26, 1924 Today's Date: 01/27/2018    History of Present Illness 85 y.o. female with a known history of HTN, HLD, CVA, depression, GERD brought in from [facility] nursing facility because of loss of breath, cough, noted to be hypoxic on baseline oxygen 2 L. Workup showed PNA.     PT Comments    Pt is progressing to the chair with willingness to work, but is requiring help to get there.  Per her caregiver, has been getting assistance at home prior to this.  Her plan is to progress as much as possible with standing control for home, then transition to her accessible environment.     Follow Up Recommendations  Home health PT;Supervision for mobility/OOB;Supervision - Intermittent     Equipment Recommendations  None recommended by PT    Recommendations for Other Services       Precautions / Restrictions Precautions Precautions: Fall Restrictions Weight Bearing Restrictions: No    Mobility  Bed Mobility Overal bed mobility: Needs Assistance Bed Mobility: Supine to Sit     Supine to sit: Mod assist     General bed mobility comments: min guard to maintain balance sitting  Transfers Overall transfer level: Needs assistance Equipment used: 1 person hand held assist Transfers: Sit to/from Stand;Stand Pivot Transfers Sit to Stand: Mod assist;Max assist Stand pivot transfers: Mod assist       General transfer comment: tenuous standing and PT slid her feet to get to chair  Ambulation/Gait             General Gait Details: steps to transfer only   Stairs             Wheelchair Mobility    Modified Rankin (Stroke Patients Only)       Balance Overall balance assessment: Needs assistance Sitting-balance support: Feet supported;Bilateral upper extremity supported Sitting balance-Leahy Scale: Poor     Standing balance support: Bilateral upper extremity supported;During  functional activity Standing balance-Leahy Scale: Zero                              Cognition Arousal/Alertness: Awake/alert Behavior During Therapy: WFL for tasks assessed/performed Overall Cognitive Status: Within Functional Limits for tasks assessed                                        Exercises      General Comments        Pertinent Vitals/Pain Pain Assessment: Faces Faces Pain Scale: Hurts little more Pain Location: R shoulder when lifted overhead    Home Living                      Prior Function            PT Goals (current goals can now be found in the care plan section) Acute Rehab PT Goals Patient Stated Goal: Pt wants to feel better Progress towards PT goals: Progressing toward goals    Frequency    Min 2X/week      PT Plan Current plan remains appropriate    Co-evaluation              AM-PAC PT "6 Clicks" Mobility   Outcome Measure  Help needed turning from your back to your side while in a flat bed  without using bedrails?: A Lot Help needed moving from lying on your back to sitting on the side of a flat bed without using bedrails?: A Lot Help needed moving to and from a bed to a chair (including a wheelchair)?: A Lot Help needed standing up from a chair using your arms (e.g., wheelchair or bedside chair)?: Total Help needed to walk in hospital room?: Total Help needed climbing 3-5 steps with a railing? : Total 6 Click Score: 9    End of Session Equipment Utilized During Treatment: Gait belt Activity Tolerance: Patient limited by fatigue Patient left: in chair;with call bell/phone within reach;with chair alarm set;with family/visitor present Nurse Communication: Mobility status PT Visit Diagnosis: Other abnormalities of gait and mobility (R26.89);Muscle weakness (generalized) (M62.81)     Time: 2633-3545 PT Time Calculation (min) (ACUTE ONLY): 22 min  Charges:  $Therapeutic Activity: 8-22  mins                    Ivar Drape 01/27/2018, 1:02 PM  Samul Dada, PT MS Acute Rehab Dept. Number: [facility] [id number] and [facility] [phone]

## 2018-01-28 LAB — CREATININE, SERUM
Creatinine, Ser: 1.27 mg/dL — ABNORMAL HIGH (ref 0.44–1.00)
GFR calc non Af Amer: 37 mL/min — ABNORMAL LOW (ref >60)
GFR, EST AFRICAN AMERICAN: 43 mL/min — AB (ref >60)

## 2018-01-28 LAB — CBC
HCT: 37 % (ref 36.0–46.0)
Hemoglobin: 10.4 g/dL — ABNORMAL LOW (ref 12.0–15.0)
MCH: 22.5 pg — ABNORMAL LOW (ref 26.0–34.0)
MCHC: 28.1 g/dL — ABNORMAL LOW (ref 30.0–36.0)
MCV: 80.1 fL (ref 80.0–100.0)
Platelets: 316 10*3/uL (ref 150–400)
RBC: 4.62 MIL/uL (ref 3.87–5.11)
RDW: 16.2 % — ABNORMAL HIGH (ref 11.5–15.5)
WBC: 37.4 10*3/uL — ABNORMAL HIGH (ref 4.0–10.5)
nRBC: 0 % (ref 0.0–0.2)

## 2018-01-28 LAB — GLUCOSE, CAPILLARY: Glucose-Capillary: 108 mg/dL — ABNORMAL HIGH (ref 70–99)

## 2018-01-28 MED ORDER — IPRATROPIUM-ALBUTEROL 0.5-2.5 (3) MG/3ML IN SOLN: 3.0000 mL | Freq: Four times a day (QID) | RESPIRATORY_TRACT | Status: DC | PRN

## 2018-01-28 MED ORDER — FUROSEMIDE 40 MG PO TABS
40.0000 mg | ORAL_TABLET | Freq: Two times a day (BID) | ORAL | Status: DC
  Administered 2018-01-28 – 2018-01-31 (×6): 40 mg via ORAL
  Filled 2018-01-28 (×6): qty 1

## 2018-01-28 NOTE — Progress Notes (Signed)
Unable to educate patient at this time. Patient with dementia and confused at baseline.

## 2018-01-28 NOTE — Progress Notes (Signed)
[facility] - [place] at [facility]   PATIENT NAME: Rita BlueFrances Vang    MR#:  [id number]  DATE OF BIRTH:  12/02/1924  SUBJECTIVE: Seen at bedside, no worsening of shortness of breath.  CHIEF COMPLAINT:   Chief Complaint  Patient presents with  . Shortness of Breath   Has persistent leukocytosis.  Here for aspiration pneumonia acute on chronic respiratory failure REVIEW OF SYSTEMS:   Review of Systems  Constitutional: Negative for chills and fever.  HENT: Negative for hearing loss.   Eyes: Negative for blurred vision, double vision and photophobia.  Respiratory: Positive for shortness of breath. Negative for cough and hemoptysis.   Cardiovascular: Negative for palpitations, orthopnea and leg swelling.  Gastrointestinal: Negative for abdominal pain, diarrhea and vomiting.  Genitourinary: Negative for dysuria and urgency.  Musculoskeletal: Negative for myalgias and neck pain.  Skin: Negative for rash.  Neurological: Negative for dizziness, focal weakness, seizures, weakness and headaches.  Psychiatric/Behavioral: Negative for memory loss. The patient does not have insomnia.    Has dementia but able to answer my questions appropriately.  DRUG ALLERGIES:   Allergies  Allergen Reactions  . Sulfamethoxazole-Trimethoprim Other (See Comments)    Chest pain  . Oxycontin [Oxycodone Hcl] Other (See Comments)    Hallucinations   . Clarithromycin Other (See Comments)    Abdominal pain     VITALS:  Blood pressure 133/77, pulse 66, temperature 97.8 F (36.6 C), temperature source Oral, resp. rate 19, height 5\' 4"  (1.626 m), weight 53.9 kg, SpO2 96 %.  PHYSICAL EXAMINATION:  GENERAL:  85 y.o.-year-old patient lying in the bed with no acute distress.  EYES: Pupils equal, round, reactive to light and accommodation. No scleral icterus. Extraocular muscles intact.  HEENT: Head atraumatic, normocephalic. Oropharynx and nasopharynx clear.  NECK:  Supple, no  jugular venous distention. No thyroid enlargement, no tenderness.  LUNGS: Diminished air entry bilaterally.  CARDIOVASCULAR: S1, S2 normal. No murmurs, rubs, or gallops.  ABDOMEN: Soft, nontender, nondistended. Bowel sounds present. No organomegaly or mass.  EXTREMITIES: No pedal edema, cyanosis, or clubbing.  NEUROLOGIC:  No Gross neurological deficit observed. PSYCHIATRIC: The patient is alert, hard of hearing. SKIN: No obvious rash, lesion, or ulcer.    LABORATORY PANEL:   CBC Recent Labs  Lab 01/26/18 1009  WBC 30.7*  HGB 10.2*  HCT 35.6*  PLT 322   ------------------------------------------------------------------------------------------------------------------  Chemistries  Recent Labs  Lab 01/24/18 0616  01/26/18 0011  01/28/18 0411  NA 140   < > 139  --   --   K 3.4*   < > 3.3*  --   --   CL 104   < > 97*  --   --   CO2 26   < > 31  --   --   GLUCOSE 126*   < > 135*  --   --   BUN 18   < > 42*  --   --   CREATININE 0.87   < > 1.10*   < > 1.27*  CALCIUM 8.6*   < > 8.5*  --   --   AST 24  --   --   --   --   ALT 14  --   --   --   --   ALKPHOS 78  --   --   --   --   BILITOT 0.8  --   --   --   --    < > = values  in this interval not displayed.   ------------------------------------------------------------------------------------------------------------------  Cardiac Enzymes No results for input(s): TROPONINI in the last 168 hours. ------------------------------------------------------------------------------------------------------------------  RADIOLOGY:  Dg Chest Port 1 View  Result Date: 01/27/2018 CLINICAL DATA:  Pneumonia. EXAM: PORTABLE CHEST 1 VIEW COMPARISON:  Radiograph of January 24, 2018. FINDINGS: Stable cardiomegaly. Atherosclerosis of thoracic aorta is noted. No pneumothorax is noted. Old left rib fractures are noted. Stable central pulmonary vascular congestion is noted with possible bilateral pulmonary edema. No significant pleural effusion  is noted. IMPRESSION: Stable cardiomegaly and central pulmonary vascular congestion is noted with possible bilateral pulmonary edema. Aortic Atherosclerosis (ICD10-I70.0). Electronically Signed   By: Lupita Raider, M.D.   On: 01/27/2018 09:06    EKG:   Orders placed or performed during the hospital encounter of 01/24/18  . ED EKG 12-Lead  . ED EKG 12-Lead  . EKG 12-Lead  . EKG 12-Lead  . EKG 12-Lead  . EKG 12-Lead  . EKG 12-Lead  . EKG 12-Lead    ASSESSMENT AND PLAN:    85 year old female patient with sepsis on admission secondary to pneumonia, clinically improving, continue Zosyn till today, discontinue after today dose.  Continue bronchodilators, IV steroids are adjusted.  Clinically improving, Patient MRSA screen is positive. so she is on contact precautions.  .  Leukocytosis worse because of steroids.  Follow the trend. #2.  Hypokalemia, replace potassium.,  Potassium is better. 3.  Acute on chronic diastolic heart failure, change to p.o. Lasix today. #4 GERD: Continue PPI  I spoke with patient's son.  Possible discharge tomorrow.  .  All the records are reviewed and case discussed with Care Management/Social Workerr. Management plans discussed with the patient, family and they are in agreement.  CODE STATUS: Full code  TOTAL TIME TAKING CARE OF THIS PATIENT: 40 minutes.   POSSIBLE D/C IN 1-2DAYS, DEPENDING ON CLINICAL CONDITION. More than 50% time spent in counseling, coordination of care.  Katha Hamming M.D on 01/28/2018 at 11:17 AM  Between 7am to 6pm - Pager - [phone]  After 6pm go to [email or web address] - password [email or web address] [facility]  [place] [facility]  Office  [phone]  CC: Primary care physician; Gracelyn Nurse, MD   Note: This dictation was prepared with Dragon dictation along with smaller phrase technology. Any transcriptional errors that result from this process are unintentional.

## 2018-01-29 DIAGNOSIS — L89150 Pressure ulcer of sacral region, unstageable: Secondary | ICD-10-CM

## 2018-01-29 LAB — CBC
HCT: 33.5 % — ABNORMAL LOW (ref 36.0–46.0)
HEMOGLOBIN: 9.6 g/dL — AB (ref 12.0–15.0)
MCH: 22.8 pg — ABNORMAL LOW (ref 26.0–34.0)
MCHC: 28.7 g/dL — ABNORMAL LOW (ref 30.0–36.0)
MCV: 79.6 fL — ABNORMAL LOW (ref 80.0–100.0)
Platelets: 246 10*3/uL (ref 150–400)
RBC: 4.21 MIL/uL (ref 3.87–5.11)
RDW: 16 % — ABNORMAL HIGH (ref 11.5–15.5)
WBC: 32 10*3/uL — ABNORMAL HIGH (ref 4.0–10.5)
nRBC: 0 % (ref 0.0–0.2)

## 2018-01-29 LAB — CREATININE, SERUM
CREATININE: 1.45 mg/dL — AB (ref 0.44–1.00)
GFR calc Af Amer: 36 mL/min — ABNORMAL LOW (ref >60)
GFR calc non Af Amer: 31 mL/min — ABNORMAL LOW (ref >60)

## 2018-01-29 LAB — CULTURE, BLOOD (ROUTINE X 2): Culture: NO GROWTH

## 2018-01-29 LAB — GLUCOSE, CAPILLARY: Glucose-Capillary: 105 mg/dL — ABNORMAL HIGH (ref 70–99)

## 2018-01-29 MED ORDER — GALANTAMINE HYDROBROMIDE ER 8 MG PO CP24
8.0000 mg | ORAL_CAPSULE | Freq: Every day | ORAL | Status: DC
  Administered 2018-01-31: 8 mg via ORAL
  Filled 2018-01-29 (×2): qty 1

## 2018-01-29 MED ORDER — DOXYCYCLINE HYCLATE 100 MG PO TABS
100.0000 mg | ORAL_TABLET | Freq: Two times a day (BID) | ORAL | Status: DC
  Administered 2018-01-29 – 2018-01-30 (×2): 100 mg via ORAL
  Filled 2018-01-29 (×2): qty 1

## 2018-01-29 MED ORDER — PREDNISONE 20 MG PO TABS: 40.0000 mg | ORAL_TABLET | Freq: Every day | ORAL | Status: DC

## 2018-01-29 NOTE — Clinical Social Work Placement (Signed)
   CLINICAL SOCIAL WORK PLACEMENT  NOTE  Date:  01/29/2018  Patient Details  Name: Rita Vang MRN: [id number] Date of Birth: 1924-10-25  Clinical Social Work is seeking post-discharge placement for this patient at the Skilled  Nursing Facility level of care (*CSW will initial, date and re-position this form in  chart as items are completed):  Yes   Patient/family provided with [facility] Clinical Social Work Department's list of facilities offering this level of care within the geographic area requested by the patient (or if unable, by the patient's family).  Yes   Patient/family informed of their freedom to choose among providers that offer the needed level of care, that participate in Medicare, Medicaid or managed care program needed by the patient, have an available bed and are willing to accept the patient.  Yes   Patient/family informed of [facility]'s ownership interest in [facility] and [facility], as well as of the fact that they are under no obligation to receive care at these facilities.  PASRR submitted to EDS on       PASRR number received on       Existing PASRR number confirmed on 01/29/18     FL2 transmitted to all facilities in geographic area requested by pt/family on 01/29/18     FL2 transmitted to all facilities within larger geographic area on       Patient informed that his/her managed care company has contracts with or will negotiate with certain facilities, including the following:        Yes   Patient/family informed of bed offers received.  Patient chooses bed at ([facility] )     Physician recommends and patient chooses bed at      Patient to be transferred to   on  .  Patient to be transferred to facility by       Patient family notified on   of transfer.  Name of family member notified:        PHYSICIAN       Additional Comment:    _______________________________________________ Tierra Thoma, Darleen Crocker, LCSW 01/29/2018, 2:45 PM

## 2018-01-29 NOTE — NC FL2 (Signed)
[place] MEDICAID FL2 LEVEL OF CARE SCREENING TOOL     IDENTIFICATION  Patient Name: Rita SnareFrances L Vang Birthdate: 01/13/1925 Sex: female Admission Date (Current Location): 01/24/2018  [place]   Facility and Address:  [facility], [address], [place], [place] [address]      Provider Number: [id number]  Attending Physician Name and Address:  Ihor AustinPyreddy, Pavan, MD  Relative Name and Phone Number:       Current Level of Care: Hospital Recommended Level of Care: Skilled Nursing Facility Prior Approval Number:    Date Approved/Denied:   PASRR Number: [id number])  Discharge Plan: SNF    Current Diagnoses: Patient Active Problem List   Diagnosis Date Noted  . TIA (transient ischemic attack) 06/17/2017  . Palliative care by specialist   . DNR (do not resuscitate) discussion   . Lewy body dementia without behavioral disturbance (HCC)   . Upper GI bleed 05/01/2017  . Acute gastrointestinal hemorrhage   . Gastric irritation   . Herpes genitalis 04/04/2017  . Protein-calorie malnutrition (HCC) 04/04/2017  . Hypomagnesemia 04/04/2017  . Vitamin B12 deficiency 04/04/2017  . Generalized weakness 04/01/2017  . Pressure injury of skin 03/07/2017  . Sepsis (HCC) 03/06/2017  . CAP (community acquired pneumonia) 03/06/2017  . HTN (hypertension) 03/06/2017  . Anxiety 03/06/2017  . HLD (hyperlipidemia) 03/06/2017  . GERD (gastroesophageal reflux disease) 03/06/2017  . CKD (chronic kidney disease), stage III (HCC) 03/06/2017  . CVA (cerebral infarction) 08/23/2014    Orientation RESPIRATION BLADDER Height & Weight     Self  O2(2 Liters Oxygen. ) Incontinent Weight: 113 lb 3.2 oz (51.3 kg) Height:  5\' 4"  (162.6 cm)  BEHAVIORAL SYMPTOMS/MOOD NEUROLOGICAL BOWEL NUTRITION STATUS  (none) (none) Continent Diet(Diet: DYS 1 )  AMBULATORY STATUS COMMUNICATION OF NEEDS Skin   Extensive Assist Verbally Normal                        Personal Care Assistance Level of Assistance  Bathing, Feeding, Dressing Bathing Assistance: Limited assistance Feeding assistance: Independent Dressing Assistance: Limited assistance     Functional Limitations Info  Sight, Hearing, Speech Sight Info: Impaired Hearing Info: Adequate Speech Info: Adequate    SPECIAL CARE FACTORS FREQUENCY  PT (By licensed PT), OT (By licensed OT)     PT Frequency: (5) OT Frequency: (5)            Contractures Contractures Info: Not present    Additional Factors Info  Code Status, Allergies Code Status Info: (Full Code. ) Allergies Info: (Sulfamethoxazole-trimethoprim, Oxycontin Oxycodone Hcl, Clarithromycin)           Current Medications (01/29/2018):  This is the current hospital active medication list Current Facility-Administered Medications  Medication Dose Route Frequency Provider Last Rate Last Dose  . 0.9 %  sodium chloride infusion   Intravenous PRN Darci CurrentBrown, [place] N, MD 10 mL/hr at 01/24/18 0730    . 0.9 %  sodium chloride infusion   Intravenous PRN Darci CurrentBrown, [place] N, MD 10 mL/hr at 01/28/18 2124 250 mL at 01/28/18 2124  . acetaminophen (TYLENOL) tablet 650 mg  650 mg Oral Q6H PRN Katha HammingKonidena, Snehalatha, MD       Or  . acetaminophen (TYLENOL) suppository 650 mg  650 mg Rectal Q6H PRN Katha HammingKonidena, Snehalatha, MD      . aspirin EC tablet 81 mg  81 mg Oral Daily Katha HammingKonidena, Snehalatha, MD   81 mg at 01/29/18 1013  . bisacodyl (DULCOLAX)  EC tablet 5 mg  5 mg Oral Daily PRN Katha Hamming, MD      . Chlorhexidine Gluconate Cloth 2 % PADS 6 each  6 each Topical Q0600 Katha Hamming, MD   6 each at 01/29/18 [phone]  . citalopram (CELEXA) tablet 20 mg  20 mg Oral Daily Katha Hamming, MD   20 mg at 01/29/18 1013  . docusate sodium (COLACE) capsule 100 mg  100 mg Oral BID Katha Hamming, MD   100 mg at 01/29/18 1013  . donepezil (ARICEPT) tablet 10 mg  10 mg Oral QHS Katha Hamming, MD   10 mg at 01/28/18  2120  . enoxaparin (LOVENOX) injection 30 mg  30 mg Subcutaneous Q24H Albina Billet, RPH   30 mg at 01/28/18 2120  . fluticasone (FLONASE) 50 MCG/ACT nasal spray 1 spray  1 spray Each Nare Daily Katha Hamming, MD   1 spray at 01/29/18 1142  . furosemide (LASIX) tablet 40 mg  40 mg Oral BID Katha Hamming, MD   40 mg at 01/29/18 1013  . galantamine (RAZADYNE) tablet 4 mg  4 mg Oral BID WC Katha Hamming, MD   4 mg at 01/29/18 1142  . ipratropium (ATROVENT) nebulizer solution 0.5 mg  0.5 mg Inhalation Q6H PRN Katha Hamming, MD      . ipratropium-albuterol (DUONEB) 0.5-2.5 (3) MG/3ML nebulizer solution 3 mL  3 mL Nebulization Q6H PRN Katha Hamming, MD      . metoprolol tartrate (LOPRESSOR) tablet 25 mg  25 mg Oral BID Katha Hamming, MD   25 mg at 01/29/18 1013  . mupirocin ointment (BACTROBAN) 2 % 1 application  1 application Nasal BID Katha Hamming, MD   1 application at 01/28/18 2121  . ondansetron (ZOFRAN) tablet 4 mg  4 mg Oral Q6H PRN Katha Hamming, MD       Or  . ondansetron (ZOFRAN) injection 4 mg  4 mg Intravenous Q6H PRN Katha Hamming, MD      . pantoprazole (PROTONIX) EC tablet 40 mg  40 mg Oral BID AC Katha Hamming, MD   40 mg at 01/29/18 1013  . piperacillin-tazobactam (ZOSYN) IVPB 3.375 g  3.375 g Intravenous Q8H Katha Hamming, MD 12.5 mL/hr at 01/29/18 0637 3.375 g at 01/29/18 0637  . [START ON 01/30/2018] predniSONE (DELTASONE) tablet 40 mg  40 mg Oral Q breakfast Pyreddy, Pavan, MD      . QUEtiapine (SEROQUEL) tablet 50 mg  50 mg Oral QHS Katha Hamming, MD   50 mg at 01/28/18 2120  . simvastatin (ZOCOR) tablet 20 mg  20 mg Oral q1800 Katha Hamming, MD   20 mg at 01/28/18 1744  . sodium chloride flush (NS) 0.9 % injection 3 mL  3 mL Intravenous Q12H Katha Hamming, MD   3 mL at 01/29/18 1013  . sodium chloride flush (NS) 0.9 % injection 3 mL  3 mL Intravenous PRN Katha Hamming, MD       . traZODone (DESYREL) tablet 25 mg  25 mg Oral QHS PRN Katha Hamming, MD   25 mg at 01/27/18 2051  . valACYclovir (VALTREX) tablet 500 mg  500 mg Oral BID Katha Hamming, MD   500 mg at 01/29/18 1013     Discharge Medications: Please see discharge summary for a list of discharge medications.  Relevant Imaging Results:  Relevant Lab Results:   Additional Information (SSN: [id number])  Brooks Kinnan, Darleen Crocker, LCSW

## 2018-01-29 NOTE — Progress Notes (Signed)
Physical Therapy Treatment Patient Details Name: Rita Vang MRN: [id number] DOB: 01/16/1924 Today's Date: 01/29/2018    History of Present Illness 85 y.o. female with a known history of HTN, HLD, CVA, depression, GERD brought in from [facility] facility because of loss of breath, cough, noted to be hypoxic on baseline oxygen 2 L. Workup showed PNA.     PT Comments    Patient received in bed, caregiver present. Patient HOH and disoriented. Patient activity limited by confusion, ability to follow direction. Patient assisted to sitting at edge of bed, requiring max assist. Mod assist to maintain sitting at EOB due to posterior lean and left leaning at times. Patient able to perform LAQ in sitting. Patient would require total assist pivot transfer to chair, however requests to lie back down. Patient will continue to benefit from skilled PT to address her functional limitations.      Follow Up Recommendations  SNF;Supervision for mobility/OOB;Supervision/Assistance - 24 hour     Equipment Recommendations  None recommended by PT    Recommendations for Other Services       Precautions / Restrictions Precautions Precautions: Fall Restrictions Weight Bearing Restrictions: No    Mobility  Bed Mobility Overal bed mobility: Needs Assistance Bed Mobility: Supine to Sit;Sit to Supine     Supine to sit: Mod assist Sit to supine: Mod assist;Max assist   General bed mobility comments: min guard to maintain upright posture and balance in sitting   Transfers Overall transfer level: Needs assistance               General transfer comment: not attempted this visit as patient's sitting balance is decreased. patient requesting to lie back down.   Ambulation/Gait             General Gait Details: patient is non-ambulatory at baseline   Stairs             Wheelchair Mobility    Modified Rankin (Stroke Patients Only)       Balance Overall balance assessment:  Needs assistance Sitting-balance support: Bilateral upper extremity supported;Feet supported Sitting balance-Leahy Scale: Fair Sitting balance - Comments: requires min guard to maintain sitting balance and upright posture Postural control: Posterior lean;Left lateral lean     Standing balance comment: standing not attemptde this day                            Cognition Arousal/Alertness: Awake/alert Behavior During Therapy: WFL for tasks assessed/performed Overall Cognitive Status: History of cognitive impairments - at baseline                                        Exercises Other Exercises Other Exercises: patient sat EOB x 5 min, performed LAQ x 10 bilaterally while supported in sitting.     General Comments        Pertinent Vitals/Pain Pain Assessment: No/denies pain    Home Living Family/patient expects to be discharged to:: Assisted living             Home Equipment: Wheelchair - manual;Hand held shower head;Toilet riser;Shower seat;Bedside commode;Walker - 2 wheels;Walker - 4 wheels Additional Comments: Per previous documentation, resident of [facility] [place]    Prior Function Level of Independence: Needs assistance  Gait / Transfers Assistance Needed: per her caregiver who is in the room during tx reports  she is assisted into wheelchair at the [facility].    Comments: friend at bedside to confirm PLOF: [facility], stated previously she was able to stand and transfer with 1 person. Helps use legs [facility]. No falls   PT Goals (current goals can now be found in the care plan section) Acute Rehab PT Goals Patient Stated Goal: Pt wants to feel better PT Goal Formulation: With patient Time For Goal Achievement: 02/09/18 Potential to Achieve Goals: Good    Frequency    Min 2X/week      PT Plan      Co-evaluation              AM-PAC PT "6 Clicks" Mobility   Outcome  Measure  Help needed turning from your back to your side while in a flat bed without using bedrails?: A Lot Help needed moving from lying on your back to sitting on the side of a flat bed without using bedrails?: A Lot Help needed moving to and from a bed to a chair (including a wheelchair)?: A Lot Help needed standing up from a chair using your arms (e.g., wheelchair or bedside chair)?: Total Help needed to walk in hospital room?: Total Help needed climbing 3-5 steps with a railing? : Total 6 Click Score: 9    End of Session Equipment Utilized During Treatment: Oxygen Activity Tolerance: Patient tolerated treatment well;Patient limited by fatigue Patient left: in bed;with call bell/phone within reach;with bed alarm set;with family/visitor present Nurse Communication: Mobility status PT Visit Diagnosis: Muscle weakness (generalized) (M62.81);Other abnormalities of gait and mobility (R26.89)     Time: [id number] PT Time Calculation (min) (ACUTE ONLY): 17 min  Charges:  $Therapeutic Activity: 8-22 mins                     Kha Hari, PT, GCS 01/29/18,11:47 AM

## 2018-01-29 NOTE — Progress Notes (Addendum)
[facility] [place] [facility]   PATIENT NAME: Cristela BlueFrances Manzella    MR#:  [id number]  DATE OF BIRTH:  08/19/1924  SUBJECTIVE: Seen at bedside, no worsening of shortness of breath.  CHIEF COMPLAINT:   Chief Complaint  Patient presents with  . Shortness of Breath  Patient seen and evaluated today On oxygen via nasal cannula Occasional cough No chest pain  REVIEW OF SYSTEMS:   Review of Systems  Constitutional: Negative for chills and fever.  HENT: Negative for hearing loss.   Eyes: Negative for blurred vision, double vision and photophobia.  Respiratory: Positive for shortness of breath. Negative for cough and hemoptysis.   Cardiovascular: Negative for palpitations, orthopnea and leg swelling.  Gastrointestinal: Negative for abdominal pain, diarrhea and vomiting.  Genitourinary: Negative for dysuria and urgency.  Musculoskeletal: Negative for myalgias and neck pain.  Skin: Negative for rash.  Neurological: Negative for dizziness, focal weakness, seizures, weakness and headaches.  Psychiatric/Behavioral: Negative for memory loss. The patient does not have insomnia.    Has dementia but able to answer my questions appropriately.  DRUG ALLERGIES:   Allergies  Allergen Reactions  . Sulfamethoxazole-Trimethoprim Other (See Comments)    Chest pain  . Oxycontin [Oxycodone Hcl] Other (See Comments)    Hallucinations   . Clarithromycin Other (See Comments)    Abdominal pain     VITALS:  Blood pressure 135/78, pulse 70, temperature 98.4 F (36.9 C), temperature source Oral, resp. rate 20, height 5\' 4"  (1.626 m), weight 51.3 kg, SpO2 97 %.  PHYSICAL EXAMINATION:  GENERAL:  85 y.o.-year-old patient lying in the bed with no acute distress.  EYES: Pupils equal, round, reactive to light and accommodation. No scleral icterus. Extraocular muscles intact.  HEENT: Head atraumatic, normocephalic. Oropharynx and nasopharynx clear.  NECK:  Supple, no jugular  venous distention. No thyroid enlargement, no tenderness.  LUNGS: Diminished air entry bilaterally.  CARDIOVASCULAR: S1, S2 normal. No murmurs, rubs, or gallops.  ABDOMEN: Soft, nontender, nondistended. Bowel sounds present. No organomegaly or mass.  EXTREMITIES: No pedal edema, cyanosis, or clubbing.  NEUROLOGIC:  No Gross neurological deficit observed. PSYCHIATRIC: The patient is alert, hard of hearing. SKIN: No obvious rash, lesion, or ulcer.    LABORATORY PANEL:   CBC Recent Labs  Lab 01/29/18 0351  WBC 32.0*  HGB 9.6*  HCT 33.5*  PLT 246   ------------------------------------------------------------------------------------------------------------------  Chemistries  Recent Labs  Lab 01/24/18 0616  01/26/18 0011  01/29/18 0351  NA 140   < > 139  --   --   K 3.4*   < > 3.3*  --   --   CL 104   < > 97*  --   --   CO2 26   < > 31  --   --   GLUCOSE 126*   < > 135*  --   --   BUN 18   < > 42*  --   --   CREATININE 0.87   < > 1.10*   < > 1.45*  CALCIUM 8.6*   < > 8.5*  --   --   AST 24  --   --   --   --   ALT 14  --   --   --   --   ALKPHOS 78  --   --   --   --   BILITOT 0.8  --   --   --   --    < > =  values in this interval not displayed.   ------------------------------------------------------------------------------------------------------------------  Cardiac Enzymes No results for input(s): TROPONINI in the last 168 hours. ------------------------------------------------------------------------------------------------------------------  RADIOLOGY:  No results found.  EKG:   Orders placed or performed during the hospital encounter of 01/24/18  . ED EKG 12-Lead  . ED EKG 12-Lead  . EKG 12-Lead  . EKG 12-Lead  . EKG 12-Lead  . EKG 12-Lead  . EKG 12-Lead  . EKG 12-Lead    ASSESSMENT AND PLAN:    85 year old female patient currently under hospitalist service for pneumonia, shortness of breath and sepsis.  1.  Pneumonia improving Start patient on  oral doxycycline antibiotic Switch to oral steroids Leukocytosis improving Follow the trend  2.  Pulmonary edema secondary to heart failure exacerbation Continue diuresis with Lasix  3.  Hypokalemia improved  4.  Acute on chronic diastolic heart failure improving Continue diuresis with oral Lasix  5. GERD: Continue PPI  6.  Status post physical therapy evaluation Patient lives alone unable to take care of herself at home and has ambulatory dysfunction Social worker follow-up for SNF placement  .  All the records are reviewed and case discussed with Care Management/Social Workerr. Management plans discussed with the patient, family and they are in agreement.  CODE STATUS: Full code  TOTAL TIME TAKING CARE OF THIS PATIENT: 40 minutes.   POSSIBLE D/C IN 1-2DAYS, DEPENDING ON CLINICAL CONDITION. More than 50% time spent in counseling, coordination of care.  Ihor AustinPavan Dawana Asper M.D on 01/29/2018 at 2:35 PM  Between 7am to 6pm - Pager - [phone]  After 6pm go to [email or web address] - password [email or web address] [email or web address]  [facility]  Office  [phone]  CC: Primary care physician; Gracelyn NurseJohnston, John D, MD   Note: This dictation was prepared with Dragon dictation along with smaller phrase technology. Any transcriptional errors that result from this process are unintentional.

## 2018-01-29 NOTE — Care Management Important Message (Signed)
Important Message  Patient Details  Name: Rita Vang MRN: [id number] Date of Birth: 06/08/1924   Medicare Important Message Given:  Yes    Barrie Dunkereliliah J Deni Lefever, RN 01/29/2018, 1:45 PM

## 2018-01-29 NOTE — Progress Notes (Signed)
PT is recommending SNF. Clinical Child psychotherapist (CSW) contacted patient's son Lorin Picket and made him aware of above. Lorin Picket is agreeable to SNF search in [place]. Per Lorin Picket patient has been to [facility] in the past. Per Acupuncturist patient was at [facility] in March 2019. FL2 complete and faxed out.   CSW presented bed offers to patient's son Lorin Picket. [facility]. [facility] liaison is aware of above. Conservator, museum/gallery at [facility] is aware of above.    [employer], LCSW [phone]

## 2018-01-29 NOTE — Progress Notes (Signed)
Per nurse, pt have MOLST form in chart- saying " Yes " for medication use and BIPAP use, but " NO " for CPR, Intubation or defibrilator use. I have made changes in code status as per that.

## 2018-01-29 NOTE — Clinical Social Work Note (Signed)
[name] from [facility], plan to return, CSW continuing to follow patient's progress throughout discharge planning.  Ervin Knack. Marjani Kobel, MSW, [employer] [phone]

## 2018-01-29 NOTE — Plan of Care (Signed)
  Problem: Coping: Goal: Level of anxiety will decrease Outcome: Completed/Met   Problem: Pain Managment: Goal: General experience of comfort will improve Outcome: Completed/Met   

## 2018-01-30 DIAGNOSIS — F0280 Dementia in other diseases classified elsewhere without behavioral disturbance: Secondary | ICD-10-CM

## 2018-01-30 DIAGNOSIS — D72829 Elevated white blood cell count, unspecified: Secondary | ICD-10-CM

## 2018-01-30 DIAGNOSIS — Z881 Allergy status to other antibiotic agents status: Secondary | ICD-10-CM

## 2018-01-30 DIAGNOSIS — Z9181 History of falling: Secondary | ICD-10-CM

## 2018-01-30 DIAGNOSIS — I10 Essential (primary) hypertension: Secondary | ICD-10-CM

## 2018-01-30 DIAGNOSIS — G3183 Dementia with Lewy bodies: Secondary | ICD-10-CM

## 2018-01-30 DIAGNOSIS — E785 Hyperlipidemia, unspecified: Secondary | ICD-10-CM

## 2018-01-30 DIAGNOSIS — H9190 Unspecified hearing loss, unspecified ear: Secondary | ICD-10-CM

## 2018-01-30 DIAGNOSIS — Z885 Allergy status to narcotic agent status: Secondary | ICD-10-CM

## 2018-01-30 DIAGNOSIS — J449 Chronic obstructive pulmonary disease, unspecified: Secondary | ICD-10-CM

## 2018-01-30 DIAGNOSIS — Z8719 Personal history of other diseases of the digestive system: Secondary | ICD-10-CM

## 2018-01-30 DIAGNOSIS — Z8673 Personal history of transient ischemic attack (TIA), and cerebral infarction without residual deficits: Secondary | ICD-10-CM

## 2018-01-30 DIAGNOSIS — D649 Anemia, unspecified: Secondary | ICD-10-CM

## 2018-01-30 LAB — CBC
HCT: 32.4 % — ABNORMAL LOW (ref 36.0–46.0)
Hemoglobin: 9.2 g/dL — ABNORMAL LOW (ref 12.0–15.0)
MCH: 22.7 pg — ABNORMAL LOW (ref 26.0–34.0)
MCHC: 28.4 g/dL — ABNORMAL LOW (ref 30.0–36.0)
MCV: 80 fL (ref 80.0–100.0)
NRBC: 0 % (ref 0.0–0.2)
Platelets: 251 10*3/uL (ref 150–400)
RBC: 4.05 MIL/uL (ref 3.87–5.11)
RDW: 16 % — ABNORMAL HIGH (ref 11.5–15.5)
WBC: 33.9 10*3/uL — ABNORMAL HIGH (ref 4.0–10.5)

## 2018-01-30 LAB — GLUCOSE, CAPILLARY: Glucose-Capillary: 87 mg/dL (ref 70–99)

## 2018-01-30 NOTE — Progress Notes (Addendum)
[facility] - [place] at [facility]   PATIENT NAME: Ewa Kot    MR#:  [id number]  DATE OF BIRTH:  Mar 17, 1924  SUBJECTIVE: Seen at bedside, no worsening of shortness of breath.  CHIEF COMPLAINT:   Chief Complaint  Patient presents with  . Shortness of Breath  Patient seen and evaluated today On oxygen via nasal cannula Occasional cough No fever No chest pain Has persistent leukocytosis  REVIEW OF SYSTEMS:   Review of Systems  Constitutional: Negative for chills and fever.  HENT: Negative for hearing loss.   Eyes: Negative for blurred vision, double vision and photophobia.  Respiratory: Positive for shortness of breath. Negative for cough and hemoptysis.   Cardiovascular: Negative for palpitations, orthopnea and leg swelling.  Gastrointestinal: Negative for abdominal pain, diarrhea and vomiting.  Genitourinary: Negative for dysuria and urgency.  Musculoskeletal: Negative for myalgias and neck pain.  Skin: Negative for rash.  Neurological: Negative for dizziness, focal weakness, seizures, weakness and headaches.  Psychiatric/Behavioral: Negative for memory loss. The patient does not have insomnia.    Has dementia but able to answer my questions appropriately.  DRUG ALLERGIES:   Allergies  Allergen Reactions  . Sulfamethoxazole-Trimethoprim Other (See Comments)    Chest pain  . Oxycontin [Oxycodone Hcl] Other (See Comments)    Hallucinations   . Clarithromycin Other (See Comments)    Abdominal pain     VITALS:  Blood pressure 126/62, pulse 69, temperature 98.7 F (37.1 C), resp. rate 16, height 5\' 4"  (1.626 m), weight 53.6 kg, SpO2 97 %.  PHYSICAL EXAMINATION:  GENERAL:  85 y.o.-year-old patient lying in the bed with no acute distress.  EYES: Pupils equal, round, reactive to light and accommodation. No scleral icterus. Extraocular muscles intact.  HEENT: Head atraumatic, normocephalic. Oropharynx and nasopharynx clear.  NECK:   Supple, no jugular venous distention. No thyroid enlargement, no tenderness.  LUNGS: Diminished air entry bilaterally.  CARDIOVASCULAR: S1, S2 normal. No murmurs, rubs, or gallops.  ABDOMEN: Soft, nontender, nondistended. Bowel sounds present. No organomegaly or mass.  EXTREMITIES: No pedal edema, cyanosis, or clubbing.  NEUROLOGIC:  No Gross neurological deficit observed. PSYCHIATRIC: The patient is alert, hard of hearing. SKIN: No obvious rash, lesion, or ulcer.    LABORATORY PANEL:   CBC Recent Labs  Lab 01/30/18 0327  WBC 33.9*  HGB 9.2*  HCT 32.4*  PLT 251   ------------------------------------------------------------------------------------------------------------------  Chemistries  Recent Labs  Lab 01/24/18 0616  01/26/18 0011  01/29/18 0351  NA 140   < > 139  --   --   K 3.4*   < > 3.3*  --   --   CL 104   < > 97*  --   --   CO2 26   < > 31  --   --   GLUCOSE 126*   < > 135*  --   --   BUN 18   < > 42*  --   --   CREATININE 0.87   < > 1.10*   < > 1.45*  CALCIUM 8.6*   < > 8.5*  --   --   AST 24  --   --   --   --   ALT 14  --   --   --   --   ALKPHOS 78  --   --   --   --   BILITOT 0.8  --   --   --   --    < > =  values in this interval not displayed.   ------------------------------------------------------------------------------------------------------------------  Cardiac Enzymes No results for input(s): TROPONINI in the last 168 hours. ------------------------------------------------------------------------------------------------------------------  RADIOLOGY:  No results found.  EKG:   Orders placed or performed during the hospital encounter of 01/24/18  . ED EKG 12-Lead  . ED EKG 12-Lead  . EKG 12-Lead  . EKG 12-Lead  . EKG 12-Lead  . EKG 12-Lead  . EKG 12-Lead  . EKG 12-Lead    ASSESSMENT AND PLAN:    85 year old female patient currently under hospitalist service for pneumonia, shortness of breath and sepsis.  1.  Persistent  leukocytosis Infectious disease consultation appreciated Discussed with ID Patient has baseline leukocytosis which is worsened with steroids Steroids discontinued Follow-up WBC count closely Currently on doxycycline antibiotic which will be discontinued as per ID recommendations Cultures negative so far  2.  Pneumonia improved Discontinue antibiotics No fevers Decreased shortness of breath  3.  Pulmonary edema secondary to heart failure exacerbation Continue diuresis with Lasix  4.  Hypokalemia improved  5.  Acute on chronic diastolic heart failure improving Continue diuresis with oral Lasix  6. GERD: Continue PPI  7.  Status post physical therapy evaluation Patient lives alone unable to take care of herself at home and has ambulatory dysfunction  8.Social worker follow-up for SNF placement in am  .  All the records are reviewed and case discussed with Care Management/Social Workerr. Management plans discussed with the patient, family and they are in agreement.  CODE STATUS: Full code  TOTAL TIME TAKING CARE OF THIS PATIENT: 36 minutes.   POSSIBLE D/C IN 2 - 3 DAYS, DEPENDING ON CLINICAL CONDITION. More than 50% time spent in counseling, coordination of care.  Ihor Austin M.D on 01/30/2018 at 1:39 PM  Between 7am to 6pm - Pager - [phone]  After 6pm go to [email or web address] - password [email or web address] [facility]  [facility]  Office  [phone]  CC: Primary care physician; Gracelyn Nurse, MD   Note: This dictation was prepared with Dragon dictation along with smaller phrase technology. Any transcriptional errors that result from this process are unintentional.

## 2018-01-30 NOTE — Consult Note (Signed)
NAME: Rita Vang  DOB: 08/13/1924  MRN: [id number]  Date/Time: 01/30/2018 1:07 PM Dr. Tobi BastosPyreddy Subjective:  REASON FOR CONSULT:  ?No history available from patient as she has Lewy body dementia. Chart reviewed and spoke to her son. Rita Vang is a 85 y.o. Vang with a history of his Lewy body dementia, hypertension, hyperlipidemia, CVA, history of GI bleed, was admitted from the nursing home because of cough and low oxygen.  She had low-grade temperature of 100.2 on admission.  Flu test was negative she had a white count of 26K, lactate was 2.11 and she was admitted as pneumonia.  Chest x-ray did not really show any infiltrate and had right hilar prominence but that has been present in past x-rays as well.  She was started on vancomycin and cefepime.  She was also started on methylprednisone for possible COPD exacerbation.  I am asked to see the patient for leukocytosis.  [name] sitting in a chair.  She says she is doing fine Has sitter next to her states she does not have any diarrhea. Cough is present but not as bad as before.  No fever since hospitalization.  On speaking to her son he states the last year patient has had multiple hospital admissions and her health is deteriorated.  In February 2019 she was hospitalized for possible pneumonia.  In April 2019 she had severe GI bleed.  In July 2019 there was a diagnosis of CVA and she has had a stroke in the past as well.  She was then sent to the nursing home initially.  She had multiple falls and has been coming to the ED for tears on her skin.  Currently she has been in an assisted living facility. She follows with Dr. Sherryll BurgerShah for her dementia.  Medical history Hypertension Hyperlipidemia COPD GI bleed Thrombocytopenia Anemia CVA Lewy body dementia Fracture of proximal humerus Leukocytosis Primary osteoarthritis Falls  Past surgical history Carpal tunnel release Cataract extraction Cholecystectomy ERCP with  sphincterotomy and balloon dilatation Left total knee arthroplasty Trigger finger release Social history Ex-smoker  Family History  Problem Relation Age of Onset  . Heart attack Father   . Alzheimer's disease Mother    Allergies  Allergen Reactions  . Sulfamethoxazole-Trimethoprim Other (See Comments)    Chest pain  . Oxycontin [Oxycodone Hcl] Other (See Comments)    Hallucinations   . Clarithromycin Other (See Comments)    Abdominal pain     ? Current Facility-Administered Medications  Medication Dose Route Frequency Provider Last Rate Last Dose  . 0.9 %  sodium chloride infusion   Intravenous PRN Darci CurrentBrown, [place] N, MD 10 mL/hr at 01/24/18 0730    . 0.9 %  sodium chloride infusion   Intravenous PRN Darci CurrentBrown, [place] N, MD 10 mL/hr at 01/28/18 2124 250 mL at 01/28/18 2124  . acetaminophen (TYLENOL) tablet 650 mg  650 mg Oral Q6H PRN Katha HammingKonidena, Snehalatha, MD       Or  . acetaminophen (TYLENOL) suppository 650 mg  650 mg Rectal Q6H PRN Katha HammingKonidena, Snehalatha, MD      . aspirin EC tablet 81 mg  81 mg Oral Daily Katha HammingKonidena, Snehalatha, MD   81 mg at 01/30/18 0950  . bisacodyl (DULCOLAX) EC tablet 5 mg  5 mg Oral Daily PRN Katha HammingKonidena, Snehalatha, MD      . Chlorhexidine Gluconate Cloth 2 % PADS 6 each  6 each Topical Q0600 Katha HammingKonidena, Snehalatha, MD   6 each at 01/30/18 0554  . citalopram (CELEXA) tablet  20 mg  20 mg Oral Daily Katha Hamming, MD   20 mg at 01/30/18 0950  . docusate sodium (COLACE) capsule 100 mg  100 mg Oral BID Katha Hamming, MD   100 mg at 01/30/18 0950  . donepezil (ARICEPT) tablet 10 mg  10 mg Oral QHS Katha Hamming, MD   10 mg at 01/29/18 2155  . doxycycline (VIBRA-TABS) tablet 100 mg  100 mg Oral Q12H Pyreddy, Vivien Rota, MD   100 mg at 01/30/18 0949  . enoxaparin (LOVENOX) injection 30 mg  30 mg Subcutaneous Q24H Albina Billet, RPH   30 mg at 01/29/18 2155  . fluticasone (FLONASE) 50 MCG/ACT nasal spray 1 spray  1 spray Each Nare Daily Katha Hamming, MD   1 spray at 01/30/18 0949  . furosemide (LASIX) tablet 40 mg  40 mg Oral BID Katha Hamming, MD   40 mg at 01/30/18 0949  . galantamine (RAZADYNE ER) 24 hr capsule 8 mg  8 mg Oral Q breakfast Pyreddy, Pavan, MD      . ipratropium (ATROVENT) nebulizer solution 0.5 mg  0.5 mg Inhalation Q6H PRN Katha Hamming, MD      . ipratropium-albuterol (DUONEB) 0.5-2.5 (3) MG/3ML nebulizer solution 3 mL  3 mL Nebulization Q6H PRN Katha Hamming, MD      . metoprolol tartrate (LOPRESSOR) tablet 25 mg  25 mg Oral BID Katha Hamming, MD   25 mg at 01/30/18 0950  . mupirocin ointment (BACTROBAN) 2 % 1 application  1 application Nasal BID Katha Hamming, MD   1 application at 01/30/18 0949  . ondansetron (ZOFRAN) tablet 4 mg  4 mg Oral Q6H PRN Katha Hamming, MD       Or  . ondansetron (ZOFRAN) injection 4 mg  4 mg Intravenous Q6H PRN Katha Hamming, MD      . pantoprazole (PROTONIX) EC tablet 40 mg  40 mg Oral BID AC Katha Hamming, MD   40 mg at 01/30/18 0950  . QUEtiapine (SEROQUEL) tablet 50 mg  50 mg Oral QHS Katha Hamming, MD   50 mg at 01/29/18 2155  . simvastatin (ZOCOR) tablet 20 mg  20 mg Oral q1800 Katha Hamming, MD   20 mg at 01/29/18 1815  . sodium chloride flush (NS) 0.9 % injection 3 mL  3 mL Intravenous Q12H Katha Hamming, MD   3 mL at 01/30/18 0951  . sodium chloride flush (NS) 0.9 % injection 3 mL  3 mL Intravenous PRN Katha Hamming, MD      . traZODone (DESYREL) tablet 25 mg  25 mg Oral QHS PRN Katha Hamming, MD   25 mg at 01/27/18 2051  . valACYclovir (VALTREX) tablet 500 mg  500 mg Oral BID Katha Hamming, MD   500 mg at 01/30/18 0949     Abtx:  Anti-infectives (From admission, onward)   Start     Dose/Rate Route Frequency Ordered Stop   01/29/18 1500  doxycycline (VIBRA-TABS) tablet 100 mg     100 mg Oral Every 12 hours 01/29/18 1447     01/27/18 2200  vancomycin (VANCOCIN) IVPB 1000  mg/200 mL premix     1,000 mg 200 mL/hr over 60 Minutes Intravenous Every 48 hours 01/26/18 0922 01/27/18 2156   01/25/18 2000  vancomycin (VANCOCIN) IVPB 1000 mg/200 mL premix  Status:  Discontinued     1,000 mg 200 mL/hr over 60 Minutes Intravenous Every 36 hours 01/24/18 1047 01/26/18 0922   01/24/18 2200  valACYclovir (VALTREX) tablet 500 mg  500 mg Oral 2 times daily 01/24/18 1625     01/24/18 0900  piperacillin-tazobactam (ZOSYN) IVPB 3.375 g     3.375 g 12.5 mL/hr over 240 Minutes Intravenous Every 8 hours 01/24/18 0745 01/29/18 1359   01/24/18 0615  vancomycin (VANCOCIN) IVPB 1000 mg/200 mL premix  Status:  Discontinued     1,000 mg 200 mL/hr over 60 Minutes Intravenous  Once 01/24/18 0609 01/24/18 0613   01/24/18 0615  ceFEPIme (MAXIPIME) 2 g in sodium chloride 0.9 % 100 mL IVPB     2 g 200 mL/hr over 30 Minutes Intravenous  Once 01/24/18 0609 01/24/18 0800   01/24/18 0615  vancomycin (VANCOCIN) 1,250 mg in sodium chloride 0.9 % 250 mL IVPB     1,250 mg 166.7 mL/hr over 90 Minutes Intravenous  Once 01/24/18 1610 01/24/18 0859      REVIEW OF SYSTEMS: Not sure she understands the questions Patient states she is fine Hard of hearing Does not complain of any cough or pain or shortness of breath or diarrhea Objective:  VITALS:  BP 126/62 (BP Location: Left Arm)   Pulse 69   Temp 98.7 F (37.1 C)   Resp 16   Ht 5\' 4"  (1.626 m)   Wt 53.6 kg   SpO2 97%   BMI 20.27 kg/m  PHYSICAL EXAM:  General: Sitting in a chair,  Alert,pleasantly confused, cooperative, no distress, follows commands.  Does not know where she is or the date or the year. Head: Normocephalic, without obvious abnormality, atraumatic. Eyes: Conjunctivae clear, anicteric sclerae. Pupils are equal ENT Nares normal. No drainage or sinus tenderness. Lips, mucosa, and tongue normal. No Thrush, teeth okay dentition Neck: Supple, symmetrical, no adenopathy, thyroid: non tender no carotid bruit and no  JVD. Back: Bony projections are covered with dressing to protect her skin. Lungs: Bilateral air entry, crepitations the bases  heart: Regular rate and rhythm, no murmur, rub or gallop. Abdomen: Soft,  distended. Bowel sounds normal. No masses Extremities: Left knee scar Atraumatic, no cyanosis. No edema. No clubbing Skin: Very thin and easily bruising  lymph: Cervical, supraclavicular normal. Neurologic: Did not examine in detail grossly non-focal Pertinent Labs Lab Results CBC    CBC Latest Ref Rng & Units 01/30/2018 01/29/2018 01/28/2018  WBC 4.0 - 10.5 K/uL 33.9(H) 32.0(H) 37.4(H)  Hemoglobin 12.0 - 15.0 g/dL 9.6(E) 4.5(W) 10.4(L)  Hematocrit 36.0 - 46.0 % 32.4(L) 33.5(L) 37.0  Platelets 150 - 400 K/uL 251 246 316      Component Value Date/Time   WBC 33.9 (H) 01/30/2018 0327   RBC 4.05 01/30/2018 0327   HGB 9.2 (L) 01/30/2018 0327   HGB 13.6 04/28/2013 1105   HCT 32.4 (L) 01/30/2018 0327   HCT 41.4 04/28/2013 1105   PLT 251 01/30/2018 0327   PLT 187 04/28/2013 1105   MCV 80.0 01/30/2018 0327   MCV 92 04/28/2013 1105   MCH 22.7 (L) 01/30/2018 0327   MCHC 28.4 (L) 01/30/2018 0327   RDW 16.0 (H) 01/30/2018 0327   RDW 13.4 04/28/2013 1105   LYMPHSABS 0.8 01/24/2018 0616   LYMPHSABS 1.7 04/28/2013 1105   MONOABS 1.5 (H) 01/24/2018 0616   MONOABS 0.8 04/28/2013 1105   EOSABS 0.0 01/24/2018 0616   EOSABS 0.2 04/28/2013 1105   BASOSABS 0.0 01/24/2018 0616   BASOSABS 0.1 04/28/2013 1105    CMP Latest Ref Rng & Units 01/29/2018 01/28/2018 01/27/2018  Glucose 70 - 99 mg/dL - - -  BUN 8 - 23 mg/dL - - -  Creatinine 0.44 - 1.00 mg/dL 1.61(W) 9.60(A) 5.40(J)  Sodium 135 - 145 mmol/L - - -  Potassium 3.5 - 5.1 mmol/L - - -  Chloride 98 - 111 mmol/L - - -  CO2 22 - 32 mmol/L - - -  Calcium 8.9 - 10.3 mg/dL - - -  Total Protein 6.5 - 8.1 g/dL - - -  Total Bilirubin 0.3 - 1.2 mg/dL - - -  Alkaline Phos 38 - 126 U/L - - -  AST 15 - 41 U/L - - -  ALT 0 - 44 U/L - - -       Microbiology: Recent Results (from the past 240 hour(s))  Blood Culture (routine x 2)     Status: None   Collection Time: 01/24/18  6:15 AM  Result Value Ref Range Status   Specimen Description BLOOD LEFT HAND  Final   Special Requests   Final    BOTTLES DRAWN AEROBIC AND ANAEROBIC Blood Culture results may not be optimal due to an excessive volume of blood received in culture bottles   Culture   Final    NO GROWTH 5 DAYS Performed at [facility], [address] [place], [place] [address]    Report Status 01/29/2018 FINAL  Final  Blood Culture (routine x 2)     Status: Abnormal   Collection Time: 01/24/18  6:16 AM  Result Value Ref Range Status   Specimen Description   Final    BLOOD RIGHT HAND Performed at [facility], [address], [place], [place] [address]    Special Requests   Final    BOTTLES DRAWN AEROBIC AND ANAEROBIC Blood Culture adequate volume Performed at [facility], [address] [place], [place] [address]    Culture  Setup Time   Final    GRAM POSITIVE COCCI IN BOTH AEROBIC AND ANAEROBIC BOTTLES CRITICAL RESULT CALLED TO, READ BACK BY AND VERIFIED WITH: NATE COOKSON 01/24/18 AT 1925 BY HS    Culture (A)  Final    STAPHYLOCOCCUS SPECIES (COAGULASE NEGATIVE) THE SIGNIFICANCE OF ISOLATING THIS ORGANISM FROM A SINGLE SET OF BLOOD CULTURES WHEN MULTIPLE SETS ARE DRAWN IS UNCERTAIN. PLEASE NOTIFY THE MICROBIOLOGY DEPARTMENT WITHIN ONE WEEK IF SPECIATION AND SENSITIVITIES ARE REQUIRED. Performed at [facility], [address], [place], [place] [address]    Report Status 01/27/2018 FINAL  Final  Blood Culture ID Panel (Reflexed)     Status: Abnormal   Collection Time: 01/24/18  6:16 AM  Result Value Ref Range Status   Enterococcus species NOT DETECTED NOT DETECTED Final   Listeria monocytogenes NOT DETECTED NOT DETECTED Final   Staphylococcus species DETECTED (A) NOT DETECTED Final    Comment: Methicillin  (oxacillin) resistant coagulase negative staphylococcus. Possible blood culture contaminant (unless isolated from more than one blood culture draw or clinical case suggests pathogenicity). No antibiotic treatment is indicated for blood  culture contaminants. CRITICAL RESULT CALLED TO, READ BACK BY AND VERIFIED WITH: NATE COOKSON 01/24/18 AT 1925 BY HS    Staphylococcus aureus (BCID) NOT DETECTED NOT DETECTED Final   Methicillin resistance DETECTED (A) NOT DETECTED Final    Comment: CRITICAL RESULT CALLED TO, READ BACK BY AND VERIFIED WITH:  NATE COOKSON 01/24/18 AT 1925 BY HS    Streptococcus species NOT DETECTED NOT DETECTED Final   Streptococcus agalactiae NOT DETECTED NOT DETECTED Final   Streptococcus pneumoniae NOT DETECTED NOT DETECTED Final   Streptococcus pyogenes NOT DETECTED NOT DETECTED Final   Acinetobacter  baumannii NOT DETECTED NOT DETECTED Final   Enterobacteriaceae species NOT DETECTED NOT DETECTED Final   Enterobacter cloacae complex NOT DETECTED NOT DETECTED Final   Escherichia coli NOT DETECTED NOT DETECTED Final   Klebsiella oxytoca NOT DETECTED NOT DETECTED Final   Klebsiella pneumoniae NOT DETECTED NOT DETECTED Final   Proteus species NOT DETECTED NOT DETECTED Final   Serratia marcescens NOT DETECTED NOT DETECTED Final   Haemophilus influenzae NOT DETECTED NOT DETECTED Final   Neisseria meningitidis NOT DETECTED NOT DETECTED Final   Pseudomonas aeruginosa NOT DETECTED NOT DETECTED Final   Candida albicans NOT DETECTED NOT DETECTED Final   Candida glabrata NOT DETECTED NOT DETECTED Final   Candida krusei NOT DETECTED NOT DETECTED Final   Candida parapsilosis NOT DETECTED NOT DETECTED Final   Candida tropicalis NOT DETECTED NOT DETECTED Final    Comment: Performed at [facility], [address] [place], [place] [address]  MRSA PCR Screening     Status: Abnormal   Collection Time: 01/25/18  7:38 AM  Result Value Ref Range Status   MRSA by PCR POSITIVE  (A) NEGATIVE Final    Comment:        The GeneXpert MRSA Assay (FDA approved for NASAL specimens only), is one component of a comprehensive MRSA colonization surveillance program. It is not intended to diagnose MRSA infection nor to guide or monitor treatment for MRSA infections. CRITICAL RESULT CALLED TO, READ BACK BY AND VERIFIED WITH: ASHLEY JACKSON AT 0915 ON 01/25/18 [facility]. Performed at [facility], [address] [place], [place] [address]    IMAGING RESULTS: ? Impression/Recommendation ?YTZEL SINGELTON is a 85 y.o. Vang with a history of his Lewy body dementia, hypertension, hyperlipidemia, CVA, history of GI bleed, was admitted from the nursing home because of cough and low oxygen.  She had low-grade temperature of 100.2 on admission.  Flu test was negative she had a white count of 26K, lactate was 2.11 and she was admitted as pneumonia.  Chest x-ray did not really show any infiltrate and had right hilar prominence but that has been present in past x-rays as well.  She was started on vancomycin and cefepime.  She was also started on methylprednisone for possible COPD exacerbation.  I am asked to see the patient for leukocytosis.   Leukocytosis: Patient has underlying leukocytosis and with recent use of steroids until yesterday there is an uptake.  I do not see any acute infection including pneumonia or UTI or diarrhea to suggest C. difficile.  She has been treated with the Vanco and cefepime and now on doxycycline would recommend stopping antibiotics. baseline leukocytosis raises the question of CLL.  She is also anemic. Not sure at her age how much of investigation is needed.  But if necessary and hematology consult can be obtained by the primary team.  Cough with shortness of breath and hypoxia on admission.  The chest x-ray shows element of congestive heart failure.  The right hilar prominence has been present in previous x-rays as well.  Doubt she has pneumonia.  If  needed a procalcitonin can be done.  The coag negative staph species in 1 of the bottle is skin contaminant and does not need any further treatment or investigation.  Lewy body dementia.  As per her son she lacks contextual memory.  History of GI bleed.  History of recurrent falls.  Frailty of old age ? ___________________________________________________ Discussed with her son in detail and also with her nurse There is nothing  more to offer from an infectious disease perspective and hence will sign off.  Thank you for the consult

## 2018-01-30 NOTE — Progress Notes (Signed)
Plan is for patient to D/C to [facility] when medically stable. [facility] liaison is aware of above. Patient's son Lorin Picket is aware of above.   [employer], LCSW [phone]

## 2018-01-31 LAB — CREATININE, SERUM
Creatinine, Ser: 1.43 mg/dL — ABNORMAL HIGH (ref 0.44–1.00)
GFR calc Af Amer: 37 mL/min — ABNORMAL LOW (ref >60)
GFR calc non Af Amer: 32 mL/min — ABNORMAL LOW (ref >60)

## 2018-01-31 LAB — GLUCOSE, CAPILLARY: GLUCOSE-CAPILLARY: 99 mg/dL (ref 70–99)

## 2018-01-31 MED ORDER — TRAMADOL HCL 50 MG PO TABS: 50.0000 mg | ORAL_TABLET | Freq: Two times a day (BID) | ORAL | 0 refills | Status: DC | PRN

## 2018-01-31 MED ORDER — PANTOPRAZOLE SODIUM 40 MG PO TBEC: 40.0000 mg | DELAYED_RELEASE_TABLET | Freq: Every day | ORAL | 0 refills | Status: AC

## 2018-01-31 MED ORDER — FUROSEMIDE 20 MG PO TABS: 40.0000 mg | ORAL_TABLET | Freq: Every day | ORAL | 0 refills | Status: DC

## 2018-01-31 NOTE — Progress Notes (Signed)
[name] going to [facility] room 606. Report called to [facility]. NT prepared patient for transfer via EMS. Nurse removed IV, printed AVS. EMS called.

## 2018-01-31 NOTE — Discharge Summary (Signed)
[employer] - [place] at [facility]   PATIENT NAME: Rita BlueFrances Vang    MR#:  [id number]  DATE OF BIRTH:  09/09/1924  DATE OF ADMISSION:  01/24/2018 ADMITTING PHYSICIAN: Katha HammingSnehalatha Konidena, MD  DATE OF DISCHARGE: 01/31/2018  PRIMARY CARE PHYSICIAN: Gracelyn NurseJohnston, John D, MD   ADMISSION DIAGNOSIS:  Sepsis, due to unspecified organism, unspecified whether acute organ dysfunction present (HCC) [A41.9] Pneumonia Acute on chronic diastolic heart failure DISCHARGE DIAGNOSIS:  Sepsis secondary to pneumonia Pneumonia History of CVA Dementia Acute on chronic diastolic heart failure  SECONDARY DIAGNOSIS:   Past Medical History:  Diagnosis Date  . Anxiety   . Anxiety disorder    unspecified  . Cerebral infarction (HCC)   . CVA (cerebral vascular accident) (HCC)   . Dementia (HCC)   . Depression   . GERD (gastroesophageal reflux disease)   . GI bleed   . History of recurrent UTIs   . HLD (hyperlipidemia)   . HTN (hypertension)   . Vertigo    syncope     ADMITTING HISTORY Rita BlueFrances Preisler  is a 85 y.o. female with a known history of hypertension, hyperlipidemia, CVA, depression, GERD brought in from [facility] facility because of loss of breath, cough, noted to be hypoxic on baseline oxygen 2 L.  Patient also noted to have temperature 0.6 Fahrenheit in the emergency room.  She is on 2 L of oxygen and saturation is about 95%.  Chest x-ray is concerning for multilobar pneumonia on the right side.  Patient also has elevated lactic acid of 2.2 on admission.   HOSPITAL COURSE:  Patient was admitted to medical floor started on IV vancomycin and cefepime antibiotics.  She was diuresed with IV Lasix which was switched to oral Lasix during the hospitalization.  Patient has a chronic diastolic heart failure.  She was comfortably weaned to oxygen via nasal cannula at 2 L.  Her shortness of breath improved.  Tolerated IV antibiotics well and later patient continued IV Zosyn  antibiotic.  Patient has chronic leukocytosis which worsened secondary to steroids during the hospitalization.  Potassium was aggressively replaced.  Patient continued proton pump inhibitor for GERD.  She was evaluated by infectious disease doctor for persistent leukocytosis who said that patient has chronic leukocytosis.  Patient tolerated antibiotics well and completed the course of antibiotics.  Antibiotics were stopped as per advice from infectious disease doctor. her pneumonia improved.  Shortness of breath improved.  She has good appetite.  Blood cultures grew coagulase-negative staph which was contaminant.  MRSA PCR was positive.  HIV test was nonreactive.  Flu test was also negative.  Urine streptococcal pneumonia antigen was also negative Legionella and haemophilus was also negative.  Patient has [facility] at [facility].  Patient hemodynamically stable will be discharged today.  CONSULTS OBTAINED:  ID consulted by Dr. Rudene Andaavi Shanker DRUG ALLERGIES:   Allergies  Allergen Reactions  . Sulfamethoxazole-Trimethoprim Other (See Comments)    Chest pain  . Oxycontin [Oxycodone Hcl] Other (See Comments)    Hallucinations   . Clarithromycin Other (See Comments)    Abdominal pain     DISCHARGE MEDICATIONS:   Allergies as of 01/31/2018      Reactions   Sulfamethoxazole-trimethoprim Other (See Comments)   Chest pain   Oxycontin [oxycodone Hcl] Other (See Comments)   Hallucinations   Clarithromycin Other (See Comments)   Abdominal pain      Medication List    TAKE these medications   aspirin EC 81 MG tablet  Take 81 mg by mouth daily.   ATROVENT HFA 17 MCG/ACT inhaler Generic drug:  ipratropium Inhale 2 puffs into the lungs 3 (three) times daily.   citalopram 20 MG tablet Commonly known as:  CELEXA Take 1 tablet by mouth daily.   DERMACLOUD EX Apply 1 application topically daily as needed. Apply to left lower extremities   donepezil 10 MG tablet Commonly known as:   ARICEPT Take 1 tablet by mouth at bedtime.   fluticasone 50 MCG/ACT nasal spray Commonly known as:  FLONASE Place 1 spray into both nostrils daily.   furosemide 20 MG tablet Commonly known as:  LASIX Take 2 tablets (40 mg total) by mouth daily for 30 days. What changed:    how much to take  when to take this  reasons to take this  Another medication with the same name was removed. Continue taking this medication, and follow the directions you see here.   galantamine 4 MG tablet Commonly known as:  RAZADYNE Take 4 mg by mouth 2 (two) times daily with a meal.   hydroxypropyl methylcellulose / hypromellose 2.5 % ophthalmic solution Commonly known as:  ISOPTO TEARS / GONIOVISC Place 1 drop into both eyes 4 (four) times daily as needed for dry eyes.   metoprolol tartrate 25 MG tablet Commonly known as:  LOPRESSOR Take 1 tablet (25 mg total) by mouth 2 (two) times daily.   pantoprazole 40 MG tablet Commonly known as:  PROTONIX Take 1 tablet (40 mg total) by mouth daily for 30 days.   QUEtiapine 50 MG tablet Commonly known as:  SEROQUEL Take 50 mg by mouth at bedtime.   sennosides-docusate sodium 8.6-50 MG tablet Commonly known as:  SENOKOT-S Take 1 tablet by mouth 2 (two) times daily.   simvastatin 20 MG tablet Commonly known as:  ZOCOR Take 1 tablet by mouth daily at 8 pm.   traMADol 50 MG tablet Commonly known as:  ULTRAM Take 1 tablet (50 mg total) by mouth every 12 (twelve) hours as needed for moderate pain. What changed:    when to take this  reasons to take this   valACYclovir 500 MG tablet Commonly known as:  VALTREX Take 500 mg by mouth 2 (two) times daily.       Today  Patient seen today Tolerating diet well Comfortable on oxygen via nasal cannula at 2 L No fever Hemodynamically stable  VITAL SIGNS:  Blood pressure 129/73, pulse 62, temperature 98.3 F (36.8 C), resp. rate 13, height 5\' 4"  (1.626 m), weight 65.8 kg, SpO2 98 %.  I/O:     Intake/Output Summary (Last 24 hours) at 01/31/2018 0950 Last data filed at 01/31/2018 0000 Gross per 24 hour  Intake -  Output 0 ml  Net 0 ml    PHYSICAL EXAMINATION:  Physical Exam  GENERAL:  85 y.o.-year-old patient lying in the bed with no acute distress.  LUNGS: Normal breath sounds bilaterally, no wheezing, rales,rhonchi or crepitation. No use of accessory muscles of respiration.  CARDIOVASCULAR: S1, S2 normal. No murmurs, rubs, or gallops.  ABDOMEN: Soft, non-tender, non-distended. Bowel sounds present. No organomegaly or mass.  NEUROLOGIC: Moves all 4 extremities. PSYCHIATRIC: The patient is alert and oriented x 3.  SKIN: No obvious rash, lesion, or ulcer.   DATA REVIEW:   CBC Recent Labs  Lab 01/30/18 0327  WBC 33.9*  HGB 9.2*  HCT 32.4*  PLT 251    Chemistries  Recent Labs  Lab 01/26/18 0011  01/31/18 0518  NA  139  --   --   K 3.3*  --   --   CL 97*  --   --   CO2 31  --   --   GLUCOSE 135*  --   --   BUN 42*  --   --   CREATININE 1.10*   < > 1.43*  CALCIUM 8.5*  --   --    < > = values in this interval not displayed.    Cardiac Enzymes No results for input(s): TROPONINI in the last 168 hours.  Microbiology Results  Results for orders placed or performed during the hospital encounter of 01/24/18  Blood Culture (routine x 2)     Status: None   Collection Time: 01/24/18  6:15 AM  Result Value Ref Range Status   Specimen Description BLOOD LEFT HAND  Final   Special Requests   Final    BOTTLES DRAWN AEROBIC AND ANAEROBIC Blood Culture results may not be optimal due to an excessive volume of blood received in culture bottles   Culture   Final    NO GROWTH 5 DAYS Performed at [facility], [address] [place], [place] [address]    Report Status 01/29/2018 FINAL  Final  Blood Culture (routine x 2)     Status: Abnormal   Collection Time: 01/24/18  6:16 AM  Result Value Ref Range Status   Specimen Description   Final    BLOOD RIGHT  HAND Performed at [facility], [address], [place], [place] [address]    Special Requests   Final    BOTTLES DRAWN AEROBIC AND ANAEROBIC Blood Culture adequate volume Performed at [facility], [address] [place], [place] [address]    Culture  Setup Time   Final    GRAM POSITIVE COCCI IN BOTH AEROBIC AND ANAEROBIC BOTTLES CRITICAL RESULT CALLED TO, READ BACK BY AND VERIFIED WITH: NATE COOKSON 01/24/18 AT 1925 BY HS    Culture (A)  Final    STAPHYLOCOCCUS SPECIES (COAGULASE NEGATIVE) THE SIGNIFICANCE OF ISOLATING THIS ORGANISM FROM A SINGLE SET OF BLOOD CULTURES WHEN MULTIPLE SETS ARE DRAWN IS UNCERTAIN. PLEASE NOTIFY THE MICROBIOLOGY DEPARTMENT WITHIN ONE WEEK IF SPECIATION AND SENSITIVITIES ARE REQUIRED. Performed at [facility], [address], [place], [place] [address]    Report Status 01/27/2018 FINAL  Final  Blood Culture ID Panel (Reflexed)     Status: Abnormal   Collection Time: 01/24/18  6:16 AM  Result Value Ref Range Status   Enterococcus species NOT DETECTED NOT DETECTED Final   Listeria monocytogenes NOT DETECTED NOT DETECTED Final   Staphylococcus species DETECTED (A) NOT DETECTED Final    Comment: Methicillin (oxacillin) resistant coagulase negative staphylococcus. Possible blood culture contaminant (unless isolated from more than one blood culture draw or clinical case suggests pathogenicity). No antibiotic treatment is indicated for blood  culture contaminants. CRITICAL RESULT CALLED TO, READ BACK BY AND VERIFIED WITH: NATE COOKSON 01/24/18 AT 1925 BY HS    Staphylococcus aureus (BCID) NOT DETECTED NOT DETECTED Final   Methicillin resistance DETECTED (A) NOT DETECTED Final    Comment: CRITICAL RESULT CALLED TO, READ BACK BY AND VERIFIED WITH:  NATE COOKSON 01/24/18 AT 1925 BY HS    Streptococcus species NOT DETECTED NOT DETECTED Final   Streptococcus agalactiae NOT DETECTED NOT DETECTED Final   Streptococcus pneumoniae NOT  DETECTED NOT DETECTED Final   Streptococcus pyogenes NOT DETECTED NOT DETECTED Final   Acinetobacter baumannii NOT DETECTED  NOT DETECTED Final   Enterobacteriaceae species NOT DETECTED NOT DETECTED Final   Enterobacter cloacae complex NOT DETECTED NOT DETECTED Final   Escherichia coli NOT DETECTED NOT DETECTED Final   Klebsiella oxytoca NOT DETECTED NOT DETECTED Final   Klebsiella pneumoniae NOT DETECTED NOT DETECTED Final   Proteus species NOT DETECTED NOT DETECTED Final   Serratia marcescens NOT DETECTED NOT DETECTED Final   Haemophilus influenzae NOT DETECTED NOT DETECTED Final   Neisseria meningitidis NOT DETECTED NOT DETECTED Final   Pseudomonas aeruginosa NOT DETECTED NOT DETECTED Final   Candida albicans NOT DETECTED NOT DETECTED Final   Candida glabrata NOT DETECTED NOT DETECTED Final   Candida krusei NOT DETECTED NOT DETECTED Final   Candida parapsilosis NOT DETECTED NOT DETECTED Final   Candida tropicalis NOT DETECTED NOT DETECTED Final    Comment: Performed at [facility], [address] [place], [place] [address]  MRSA PCR Screening     Status: Abnormal   Collection Time: 01/25/18  7:38 AM  Result Value Ref Range Status   MRSA by PCR POSITIVE (A) NEGATIVE Final    Comment:        The GeneXpert MRSA Assay (FDA approved for NASAL specimens only), is one component of a comprehensive MRSA colonization surveillance program. It is not intended to diagnose MRSA infection nor to guide or monitor treatment for MRSA infections. CRITICAL RESULT CALLED TO, READ BACK BY AND VERIFIED WITH: ASHLEY JACKSON AT 0915 ON 01/25/18 [facility]. Performed at [facility], [address] [place], [place] [address]     RADIOLOGY:  No results found.  Follow up with PCP in 1 week.  Management plans discussed with the patient, family and they are in agreement.  CODE STATUS: DNR    Code Status Orders  (From admission, onward)         Start     Ordered   01/29/18  2241  Limited resuscitation (code)  Continuous    Question Answer Comment  In the event of cardiac or respiratory ARREST: Initiate Code Blue, Call Rapid Response Yes   In the event of cardiac or respiratory ARREST: Perform CPR No   In the event of cardiac or respiratory ARREST: Perform Intubation/Mechanical Ventilation No   In the event of cardiac or respiratory ARREST: Use NIPPV/BiPAp only if indicated Yes   In the event of cardiac or respiratory ARREST: Administer ACLS medications if indicated Yes   In the event of cardiac or respiratory ARREST: Perform Defibrillation or Cardioversion if indicated No   Comments As per MOLST form in chart      01/29/18 2241        Code Status History    Date Active Date Inactive Code Status Order ID Comments User Context   01/24/2018 0745 01/29/2018 2241 Full Code [id number]  Katha Hamming, MD ED   06/17/2017 1738 06/18/2017 1951 Partial Code [id number]  Ihor Austin, MD Inpatient   05/07/2017 1140 05/07/2017 1817 Partial Code [id number]  Altamese Dilling, MD Inpatient   05/01/2017 0414 05/07/2017 1139 Full Code [id number]  Arnaldo Natal, MD Inpatient   03/06/2017 2221 03/12/2017 1838 Full Code [id number]  Oralia Manis, MD ED   08/23/2014 1801 08/24/2014 1810 Full Code [id number]  Adrian Saran, MD ED    Advance Directive Documentation     Most Recent Value  Type of Advance Directive  Out of facility DNR (pink MOST or yellow form)  Pre-existing out of facility DNR order (yellow form or pink MOST form)  Pink  MOST form placed in chart (order not valid for inpatient use)  "MOST" Form in Place?  -      TOTAL TIME TAKING CARE OF THIS PATIENT ON DAY OF DISCHARGE: more than 35 minutes.   Ihor AustinPavan  M.D on 01/31/2018 at 9:50 AM  Between 7am to 6pm - Pager - [phone]  After 6pm go to [email or web address] - password [email or web address] [facility]  [facility] [place] [facility]  Office  [phone]  CC: Primary care physician; Gracelyn NurseJohnston, John D, MD  Note: This dictation was  prepared with Dragon dictation along with smaller phrase technology. Any transcriptional errors that result from this process are unintentional.

## 2018-01-31 NOTE — Clinical Social Work Note (Signed)
The patient will discharge to [facility] today via non-emergent EMS. The facility and the family are aware and in agreement. The CSW has sent the discharge information to the facility, and the CSW will deliver the discharge packet as soon as possible. Once the discharge packet is delivered, the CSW will sign off. Please consult should needs arise.  Argentina Ponder, MSW, Theresia Majors [phone]

## 2018-07-01 ENCOUNTER — Inpatient Hospital Stay
Admission: EM | Admit: 2018-07-01 | Discharge: 2018-07-06 | DRG: 177 | Disposition: A | Discharge status: Hospice | Payer: Medicare Other | Source: Skilled Nursing Facility | Attending: Internal Medicine | Admitting: Internal Medicine

## 2018-07-01 ENCOUNTER — Emergency Department: Payer: Medicare Other

## 2018-07-01 ENCOUNTER — Other Ambulatory Visit: Payer: Self-pay

## 2018-07-01 DIAGNOSIS — Y95 Nosocomial condition: Secondary | ICD-10-CM | POA: Diagnosis present

## 2018-07-01 DIAGNOSIS — Z87440 Personal history of urinary (tract) infections: Secondary | ICD-10-CM

## 2018-07-01 DIAGNOSIS — Z8249 Family history of ischemic heart disease and other diseases of the circulatory system: Secondary | ICD-10-CM

## 2018-07-01 DIAGNOSIS — E785 Hyperlipidemia, unspecified: Secondary | ICD-10-CM | POA: Diagnosis present

## 2018-07-01 DIAGNOSIS — F419 Anxiety disorder, unspecified: Secondary | ICD-10-CM | POA: Diagnosis present

## 2018-07-01 DIAGNOSIS — E860 Dehydration: Secondary | ICD-10-CM | POA: Diagnosis present

## 2018-07-01 DIAGNOSIS — J9601 Acute respiratory failure with hypoxia: Secondary | ICD-10-CM | POA: Diagnosis present

## 2018-07-01 DIAGNOSIS — Z820 Family history of epilepsy and other diseases of the nervous system: Secondary | ICD-10-CM

## 2018-07-01 DIAGNOSIS — Z20828 Contact with and (suspected) exposure to other viral communicable diseases: Secondary | ICD-10-CM | POA: Diagnosis present

## 2018-07-01 DIAGNOSIS — Z7982 Long term (current) use of aspirin: Secondary | ICD-10-CM | POA: Diagnosis not present

## 2018-07-01 DIAGNOSIS — E872 Acidosis: Secondary | ICD-10-CM | POA: Diagnosis present

## 2018-07-01 DIAGNOSIS — K219 Gastro-esophageal reflux disease without esophagitis: Secondary | ICD-10-CM | POA: Diagnosis present

## 2018-07-01 DIAGNOSIS — Z7951 Long term (current) use of inhaled steroids: Secondary | ICD-10-CM | POA: Diagnosis not present

## 2018-07-01 DIAGNOSIS — N179 Acute kidney failure, unspecified: Secondary | ICD-10-CM | POA: Diagnosis present

## 2018-07-01 DIAGNOSIS — J690 Pneumonitis due to inhalation of food and vomit: Secondary | ICD-10-CM | POA: Diagnosis present

## 2018-07-01 DIAGNOSIS — R0602 Shortness of breath: Secondary | ICD-10-CM

## 2018-07-01 DIAGNOSIS — I129 Hypertensive chronic kidney disease with stage 1 through stage 4 chronic kidney disease, or unspecified chronic kidney disease: Secondary | ICD-10-CM | POA: Diagnosis present

## 2018-07-01 DIAGNOSIS — N183 Chronic kidney disease, stage 3 (moderate): Secondary | ICD-10-CM | POA: Diagnosis present

## 2018-07-01 DIAGNOSIS — Z79899 Other long term (current) drug therapy: Secondary | ICD-10-CM | POA: Diagnosis not present

## 2018-07-01 DIAGNOSIS — E875 Hyperkalemia: Secondary | ICD-10-CM | POA: Diagnosis not present

## 2018-07-01 DIAGNOSIS — J189 Pneumonia, unspecified organism: Secondary | ICD-10-CM | POA: Diagnosis present

## 2018-07-01 DIAGNOSIS — D631 Anemia in chronic kidney disease: Secondary | ICD-10-CM | POA: Diagnosis present

## 2018-07-01 DIAGNOSIS — Z881 Allergy status to other antibiotic agents status: Secondary | ICD-10-CM | POA: Diagnosis not present

## 2018-07-01 DIAGNOSIS — F329 Major depressive disorder, single episode, unspecified: Secondary | ICD-10-CM | POA: Diagnosis present

## 2018-07-01 DIAGNOSIS — Z885 Allergy status to narcotic agent status: Secondary | ICD-10-CM

## 2018-07-01 DIAGNOSIS — Z8673 Personal history of transient ischemic attack (TIA), and cerebral infarction without residual deficits: Secondary | ICD-10-CM | POA: Diagnosis not present

## 2018-07-01 DIAGNOSIS — F0390 Unspecified dementia without behavioral disturbance: Secondary | ICD-10-CM | POA: Diagnosis present

## 2018-07-01 DIAGNOSIS — Z882 Allergy status to sulfonamides status: Secondary | ICD-10-CM

## 2018-07-01 DIAGNOSIS — I4891 Unspecified atrial fibrillation: Secondary | ICD-10-CM | POA: Diagnosis present

## 2018-07-01 LAB — CBC
HCT: 29.2 % — ABNORMAL LOW (ref 36.0–46.0)
Hemoglobin: 8.2 g/dL — ABNORMAL LOW (ref 12.0–15.0)
MCH: 23.8 pg — ABNORMAL LOW (ref 26.0–34.0)
MCHC: 28.1 g/dL — ABNORMAL LOW (ref 30.0–36.0)
MCV: 84.6 fL (ref 80.0–100.0)
Platelets: 179 10*3/uL (ref 150–400)
RBC: 3.45 MIL/uL — ABNORMAL LOW (ref 3.87–5.11)
RDW: 18.6 % — ABNORMAL HIGH (ref 11.5–15.5)
WBC: 16.8 10*3/uL — ABNORMAL HIGH (ref 4.0–10.5)
nRBC: 0 % (ref 0.0–0.2)

## 2018-07-01 LAB — PROTIME-INR
INR: 1.3 — ABNORMAL HIGH (ref 0.8–1.2)
Prothrombin Time: 16.1 seconds — ABNORMAL HIGH (ref 11.4–15.2)

## 2018-07-01 LAB — BASIC METABOLIC PANEL
Anion gap: 11 (ref 5–15)
BUN: 32 mg/dL — ABNORMAL HIGH (ref 8–23)
CO2: 32 mmol/L (ref 22–32)
Calcium: 8.6 mg/dL — ABNORMAL LOW (ref 8.9–10.3)
Chloride: 100 mmol/L (ref 98–111)
Creatinine, Ser: 1.2 mg/dL — ABNORMAL HIGH (ref 0.44–1.00)
GFR calc Af Amer: 46 mL/min — ABNORMAL LOW (ref >60)
GFR calc non Af Amer: 39 mL/min — ABNORMAL LOW (ref >60)
Glucose, Bld: 92 mg/dL (ref 70–99)
Potassium: 5 mmol/L (ref 3.5–5.1)
Sodium: 143 mmol/L (ref 135–145)

## 2018-07-01 LAB — TROPONIN I: Troponin I: 0.03 ng/mL (ref <0.03)

## 2018-07-01 LAB — LACTIC ACID, PLASMA
Lactic Acid, Venous: 2.3 mmol/L (ref 0.5–1.9)
Lactic Acid, Venous: 1.7 mmol/L (ref 0.5–1.9)

## 2018-07-01 LAB — BRAIN NATRIURETIC PEPTIDE: B Natriuretic Peptide: 1175 pg/mL — ABNORMAL HIGH (ref 0.0–100.0)

## 2018-07-01 LAB — MRSA PCR SCREENING: MRSA by PCR: NEGATIVE

## 2018-07-01 LAB — SARS CORONAVIRUS 2 BY RT PCR (HOSPITAL ORDER, PERFORMED IN ~~LOC~~ HOSPITAL LAB): SARS Coronavirus 2: NEGATIVE

## 2018-07-01 MED ORDER — ONDANSETRON HCL 4 MG PO TABS: 4.0000 mg | ORAL_TABLET | Freq: Four times a day (QID) | ORAL | Status: DC | PRN

## 2018-07-01 MED ORDER — VITAMIN C 500 MG PO TABS
250.0000 mg | ORAL_TABLET | Freq: Two times a day (BID) | ORAL | Status: DC
  Administered 2018-07-01 – 2018-07-06 (×10): 250 mg via ORAL
  Filled 2018-07-01 (×10): qty 1

## 2018-07-01 MED ORDER — SENNOSIDES-DOCUSATE SODIUM 8.6-50 MG PO TABS: 1.0000 | ORAL_TABLET | Freq: Every evening | ORAL | Status: DC | PRN

## 2018-07-01 MED ORDER — BISACODYL 5 MG PO TBEC: 5.0000 mg | DELAYED_RELEASE_TABLET | Freq: Every day | ORAL | Status: DC | PRN

## 2018-07-01 MED ORDER — SIMVASTATIN 20 MG PO TABS
40.0000 mg | ORAL_TABLET | Freq: Every evening | ORAL | Status: DC
  Administered 2018-07-01 – 2018-07-05 (×4): 40 mg via ORAL
  Filled 2018-07-01 (×5): qty 2

## 2018-07-01 MED ORDER — ONDANSETRON HCL 4 MG/2ML IJ SOLN: 4.0000 mg | Freq: Four times a day (QID) | INTRAMUSCULAR | Status: DC | PRN

## 2018-07-01 MED ORDER — SODIUM CHLORIDE 0.9 % IV SOLN: 2.0000 g | Freq: Three times a day (TID) | INTRAVENOUS | Status: DC

## 2018-07-01 MED ORDER — FLUTICASONE PROPIONATE 50 MCG/ACT NA SUSP
1.0000 | Freq: Every day | NASAL | Status: DC
  Administered 2018-07-03 – 2018-07-06 (×4): 1 via NASAL
  Filled 2018-07-01: qty 16

## 2018-07-01 MED ORDER — MAGNESIUM OXIDE 400 (241.3 MG) MG PO TABS
400.0000 mg | ORAL_TABLET | Freq: Every day | ORAL | Status: DC
  Administered 2018-07-02 – 2018-07-06 (×5): 400 mg via ORAL
  Filled 2018-07-01 (×5): qty 1

## 2018-07-01 MED ORDER — VALACYCLOVIR HCL 500 MG PO TABS
500.0000 mg | ORAL_TABLET | Freq: Two times a day (BID) | ORAL | Status: DC
  Administered 2018-07-01 – 2018-07-06 (×10): 500 mg via ORAL
  Filled 2018-07-01 (×11): qty 1

## 2018-07-01 MED ORDER — MAGNESIUM HYDROXIDE 400 MG/5ML PO SUSP: 30.0000 mL | Freq: Every evening | ORAL | Status: DC | PRN

## 2018-07-01 MED ORDER — VANCOMYCIN HCL IN DEXTROSE 1-5 GM/200ML-% IV SOLN
1000.0000 mg | Freq: Once | INTRAVENOUS | Status: AC
  Administered 2018-07-01: 1000 mg via INTRAVENOUS
  Filled 2018-07-01: qty 200

## 2018-07-01 MED ORDER — SODIUM CHLORIDE 0.9 % IV BOLUS
500.0000 mL | Freq: Once | INTRAVENOUS | Status: AC
  Administered 2018-07-01: 500 mL via INTRAVENOUS

## 2018-07-01 MED ORDER — SODIUM CHLORIDE 0.9 % IV SOLN
1.0000 g | Freq: Once | INTRAVENOUS | Status: AC
  Administered 2018-07-01: 1 g via INTRAVENOUS
  Filled 2018-07-01: qty 1

## 2018-07-01 MED ORDER — GALANTAMINE HYDROBROMIDE 4 MG PO TABS
4.0000 mg | ORAL_TABLET | Freq: Two times a day (BID) | ORAL | Status: DC
  Administered 2018-07-01 – 2018-07-06 (×10): 4 mg via ORAL
  Filled 2018-07-01 (×11): qty 1

## 2018-07-01 MED ORDER — ALUM & MAG HYDROXIDE-SIMETH 200-200-20 MG/5ML PO SUSP: 30.0000 mL | Freq: Four times a day (QID) | ORAL | Status: DC | PRN

## 2018-07-01 MED ORDER — CITALOPRAM HYDROBROMIDE 20 MG PO TABS
20.0000 mg | ORAL_TABLET | Freq: Every day | ORAL | Status: DC
  Administered 2018-07-02 – 2018-07-06 (×5): 20 mg via ORAL
  Filled 2018-07-01 (×5): qty 1

## 2018-07-01 MED ORDER — LOPERAMIDE HCL 2 MG PO CAPS: 2.0000 mg | ORAL_CAPSULE | ORAL | Status: DC | PRN

## 2018-07-01 MED ORDER — ALBUTEROL SULFATE (2.5 MG/3ML) 0.083% IN NEBU
5.0000 mg | INHALATION_SOLUTION | Freq: Once | RESPIRATORY_TRACT | Status: AC
  Administered 2018-07-01: 5 mg via RESPIRATORY_TRACT
  Filled 2018-07-01: qty 6

## 2018-07-01 MED ORDER — DONEPEZIL HCL 5 MG PO TABS
10.0000 mg | ORAL_TABLET | Freq: Every day | ORAL | Status: DC
  Administered 2018-07-01 – 2018-07-05 (×5): 10 mg via ORAL
  Filled 2018-07-01 (×6): qty 2

## 2018-07-01 MED ORDER — LORAZEPAM 0.5 MG PO TABS: 0.5000 mg | ORAL_TABLET | ORAL | Status: DC | PRN

## 2018-07-01 MED ORDER — ZINC SULFATE 220 (50 ZN) MG PO CAPS
220.0000 mg | ORAL_CAPSULE | Freq: Every day | ORAL | Status: DC
  Administered 2018-07-02 – 2018-07-06 (×5): 220 mg via ORAL
  Filled 2018-07-01 (×5): qty 1

## 2018-07-01 MED ORDER — ADULT MULTIVITAMIN W/MINERALS CH
1.0000 | ORAL_TABLET | Freq: Every day | ORAL | Status: DC
  Administered 2018-07-02 – 2018-07-06 (×5): 1 via ORAL
  Filled 2018-07-01 (×5): qty 1

## 2018-07-01 MED ORDER — SODIUM CHLORIDE 0.9 % IV SOLN
1.0000 g | Freq: Once | INTRAVENOUS | Status: DC
  Filled 2018-07-01: qty 1

## 2018-07-01 MED ORDER — METHYLPREDNISOLONE SODIUM SUCC 125 MG IJ SOLR
125.0000 mg | Freq: Once | INTRAMUSCULAR | Status: AC
  Administered 2018-07-01: 125 mg via INTRAVENOUS
  Filled 2018-07-01: qty 2

## 2018-07-01 MED ORDER — QUETIAPINE FUMARATE 25 MG PO TABS
50.0000 mg | ORAL_TABLET | Freq: Every day | ORAL | Status: DC
  Administered 2018-07-01 – 2018-07-05 (×5): 50 mg via ORAL
  Filled 2018-07-01 (×5): qty 2

## 2018-07-01 MED ORDER — ACETAMINOPHEN 325 MG PO TABS
650.0000 mg | ORAL_TABLET | Freq: Four times a day (QID) | ORAL | Status: DC | PRN
  Administered 2018-07-01: 650 mg via ORAL
  Filled 2018-07-01: qty 2

## 2018-07-01 MED ORDER — METOPROLOL TARTRATE 25 MG PO TABS
25.0000 mg | ORAL_TABLET | Freq: Two times a day (BID) | ORAL | Status: DC
  Administered 2018-07-01 – 2018-07-06 (×9): 25 mg via ORAL
  Filled 2018-07-01 (×10): qty 1

## 2018-07-01 MED ORDER — PRO-STAT SUGAR FREE PO LIQD
30.0000 mL | Freq: Every day | ORAL | Status: DC
  Administered 2018-07-01 – 2018-07-06 (×5): 30 mL via ORAL

## 2018-07-01 MED ORDER — SODIUM CHLORIDE 0.9 % IV SOLN
2.0000 g | INTRAVENOUS | Status: AC
  Administered 2018-07-01 – 2018-07-05 (×5): 2 g via INTRAVENOUS
  Filled 2018-07-01 (×5): qty 2

## 2018-07-01 MED ORDER — GUAIFENESIN 100 MG/5ML PO SOLN
5.0000 mL | ORAL | Status: DC | PRN
  Filled 2018-07-01: qty 5

## 2018-07-01 MED ORDER — ASPIRIN EC 81 MG PO TBEC
81.0000 mg | DELAYED_RELEASE_TABLET | Freq: Every day | ORAL | Status: DC
  Administered 2018-07-02 – 2018-07-06 (×5): 81 mg via ORAL
  Filled 2018-07-01 (×5): qty 1

## 2018-07-01 MED ORDER — ALBUTEROL SULFATE (2.5 MG/3ML) 0.083% IN NEBU: 2.5000 mg | INHALATION_SOLUTION | RESPIRATORY_TRACT | Status: DC | PRN

## 2018-07-01 MED ORDER — GUAIFENESIN 100 MG/5ML PO LIQD: 200.0000 mg | Freq: Four times a day (QID) | ORAL | Status: DC | PRN

## 2018-07-01 MED ORDER — ACETAMINOPHEN 650 MG RE SUPP: 650.0000 mg | Freq: Four times a day (QID) | RECTAL | Status: DC | PRN

## 2018-07-01 MED ORDER — HEPARIN SODIUM (PORCINE) 5000 UNIT/ML IJ SOLN
5000.0000 [IU] | Freq: Three times a day (TID) | INTRAMUSCULAR | Status: DC
  Administered 2018-07-01 – 2018-07-06 (×11): 5000 [IU] via SUBCUTANEOUS
  Filled 2018-07-01 (×12): qty 1

## 2018-07-01 MED ORDER — IPRATROPIUM BROMIDE 0.02 % IN SOLN
0.5000 mg | Freq: Once | RESPIRATORY_TRACT | Status: AC
  Administered 2018-07-01: 13:00:00 0.5 mg via RESPIRATORY_TRACT
  Filled 2018-07-01: qty 2.5

## 2018-07-01 MED ORDER — HYOSCYAMINE SULFATE 0.125 MG PO TBDP
0.1250 mg | ORAL_TABLET | ORAL | Status: DC | PRN
  Administered 2018-07-04 – 2018-07-05 (×2): 0.125 mg via ORAL
  Filled 2018-07-01 (×3): qty 1

## 2018-07-01 MED ORDER — VANCOMYCIN HCL IN DEXTROSE 1-5 GM/200ML-% IV SOLN: 1000.0000 mg | INTRAVENOUS | Status: DC

## 2018-07-01 MED ORDER — PANTOPRAZOLE SODIUM 40 MG PO TBEC
40.0000 mg | DELAYED_RELEASE_TABLET | Freq: Every day | ORAL | Status: DC
  Administered 2018-07-02 – 2018-07-06 (×5): 40 mg via ORAL
  Filled 2018-07-01 (×5): qty 1

## 2018-07-01 NOTE — ED Triage Notes (Signed)
Pt is a hospice pt from [facility]- pt normally wears 2L [place] but was having increasing SHOB x2 days- EMS reports 92% on 6L [place]- pt currently on nonrebreather at 15L satting 100%

## 2018-07-01 NOTE — TOC Initial Note (Addendum)
Transition of Care [facility]) - Initial/Assessment Note    Patient Details  Name: Rita Vang MRN: [id number] Date of Birth: 12-22-1924  [facility] of [facility]) CM/SW Contact:    Marshell Garfinkel, RN Phone Number: 07/01/2018, 1:38 PM  Clinical Narrative:                 [facility] spoke with Anderson Malta at [facility] [phone] to obtain patient baseline.  Per Anderson Malta, patient's respiratory status and mental status and become worse since yesterday.  She states that [facility] [phone] is following her and patient is DNR since April of this year signed by Dr. Lavera Guise. Checking to see which hospice agency.  She has been receiving Morphine for respiratory struggles since yesterday.  At baseline, patient is wheelchair bound but depends on staff for bathing, toileting, and transfers.  She was recently placed on 4L O2 by Doctor's making housecalls. She seemed to become choked yesterday with medication administration.  She is on chopped diet at baseline also.  Per Anderson Malta, patient's son Nicki Reaper asked that patient be sent to hospital for evaluation.        Patient Goals and CMS Choice        Expected Discharge Plan and Services                                                Prior Living Arrangements/Services                       Activities of Daily Living      Permission Sought/Granted                  Emotional Assessment              Admission diagnosis:  sob Patient Active Problem List   Diagnosis Date Noted  . Unstageable pressure ulcer of sacral region ([place]) 01/29/2018  . TIA (transient ischemic attack) 06/17/2017  . Palliative care by specialist   . DNR (do not resuscitate) discussion   . Lewy body dementia without behavioral disturbance ([place])   . Upper GI bleed 05/01/2017  . Acute gastrointestinal hemorrhage   . Gastric irritation   . Herpes genitalis 04/04/2017  . Protein-calorie malnutrition ([place]) 04/04/2017  .  Hypomagnesemia 04/04/2017  . Vitamin B12 deficiency 04/04/2017  . Generalized weakness 04/01/2017  . Pressure injury of skin 03/07/2017  . Sepsis ([place]) 03/06/2017  . CAP (community acquired pneumonia) 03/06/2017  . HTN (hypertension) 03/06/2017  . Anxiety 03/06/2017  . HLD (hyperlipidemia) 03/06/2017  . GERD (gastroesophageal reflux disease) 03/06/2017  . CKD (chronic kidney disease), stage III ([place]) 03/06/2017  . CVA (cerebral infarction) 08/23/2014   PCP:  Baxter Hire, MD Pharmacy:   [place], [place] - [address] [place] [address] Phone: [phone] Fax: [place], [place] - [address] [facility] [address] [place] [address] Phone: [phone] Fax: [phone]  [facility] [place], [place] [address] [place] [address] Phone: [phone] Fax: [phone]     Social Determinants of Health (SDOH) Interventions    Readmission Risk Interventions No flowsheet data found.

## 2018-07-01 NOTE — Consult Note (Signed)
Pharmacy Antibiotic Note  Rita Vang is a 85 y.o. female admitted on 07/01/2018 with pneumonia.  Pharmacy has been consulted for Vancomycin/Cefepime dosing.  Plan: Patient received 1g Vancomycin IV in ED, will follow with  Vancomycin 1000 mg IV Q 48 hrs. Goal AUC 400-550. Expected AUC: 481 SCr used: 1.2  Patient received 1g Cefepime IV in ED, will follow with  Cefepime 2g q 24h  Height: 5\' 4"  (162.6 cm) Weight: 121 lb (54.9 kg) IBW/kg (Calculated) : 54.7  Temp (24hrs), Avg:98.5 F (36.9 C), Min:98.5 F (36.9 C), Max:98.5 F (36.9 C)  Recent Labs  Lab 07/01/18 1132  WBC 16.8*  CREATININE 1.20*    Estimated Creatinine Clearance: 26.4 mL/min (A) (by C-G formula based on SCr of 1.2 mg/dL (H)).    Allergies  Allergen Reactions  . Sulfamethoxazole-Trimethoprim Other (See Comments)    Chest pain  . Oxycontin [Oxycodone Hcl] Other (See Comments)    Hallucinations   . Clarithromycin Other (See Comments)    Abdominal pain     Antimicrobials this admission: Vancomycin 6/17 >>  Cefepime 6/17 >>   Dose adjustments this admission: None  Microbiology results: 6/17 BCx: pending  Recent MRSA PCR + (01/26/18)  COVID NEG  Thank you for allowing pharmacy to be a part of this patient's care.  Lu Duffel, PharmD, BCPS Clinical Pharmacist 07/01/2018 1:25 PM

## 2018-07-01 NOTE — ED Notes (Signed)
Attempted to get 2nd set of blood cultures x3 unsuccessful

## 2018-07-01 NOTE — ED Notes (Signed)
Report given to Tasha RN.

## 2018-07-01 NOTE — ED Notes (Signed)
Pt given a yellow armband and yellow socks and oriented to use call bell

## 2018-07-01 NOTE — Progress Notes (Signed)
Talked to patient's hcpoa which is her son Nicki Reaper and updated him about the plan of care for this patient. Also asked and clarify what is his goal for patient's code status and he did confirm he wanted it to be DNI.   Also talked to [place] a medtech in [place] and asked asked her some admission question. No other concern at the moment. RN will continue to monitor.

## 2018-07-01 NOTE — NC FL2 (Signed)
[place] LEVEL OF CARE SCREENING TOOL     IDENTIFICATION  Patient Name: Rita Vang Birthdate: 07/21/1924 Sex: female Admission Date (Current Location): 07/01/2018  [facility] and [place] Number:  Engineering geologist and Address:         Provider Number: [id number]  Attending Physician Name and Address:  Schuyler Amor, MD  Relative Name and Phone Number:       Current Level of Care: Hospital Recommended Level of Care: [place] Prior Approval Number:    Date Approved/Denied:   PASRR Number:    Discharge Plan: Domiciliary (Rest home)    Current Diagnoses: Patient Active Problem List   Diagnosis Date Noted  . Unstageable pressure ulcer of sacral region ([place]) 01/29/2018  . TIA (transient ischemic attack) 06/17/2017  . Palliative care by specialist   . DNR (do not resuscitate) discussion   . Lewy body dementia without behavioral disturbance ([place])   . Upper GI bleed 05/01/2017  . Acute gastrointestinal hemorrhage   . Gastric irritation   . Herpes genitalis 04/04/2017  . Protein-calorie malnutrition ([place]) 04/04/2017  . Hypomagnesemia 04/04/2017  . Vitamin B12 deficiency 04/04/2017  . Generalized weakness 04/01/2017  . Pressure injury of skin 03/07/2017  . Sepsis ([place]) 03/06/2017  . CAP (community acquired pneumonia) 03/06/2017  . HTN (hypertension) 03/06/2017  . Anxiety 03/06/2017  . HLD (hyperlipidemia) 03/06/2017  . GERD (gastroesophageal reflux disease) 03/06/2017  . CKD (chronic kidney disease), stage III ([place]) 03/06/2017  . CVA (cerebral infarction) 08/23/2014    Orientation RESPIRATION BLADDER Height & Weight     Self  O2, Other (Comment)(3L; nebulizer machine) Incontinent Weight: 54.9 kg Height:  5\' 4"  (162.6 cm)  BEHAVIORAL SYMPTOMS/MOOD NEUROLOGICAL BOWEL NUTRITION STATUS      Incontinent Diet(chopped)  AMBULATORY STATUS COMMUNICATION OF NEEDS Skin   Extensive Assist Verbally Normal                        Personal Care Assistance Level of Assistance  Dressing, Bathing Bathing Assistance: Maximum assistance   Dressing Assistance: Maximum assistance     Functional Limitations Info  Hearing, Sight Sight Info: Impaired Hearing Info: Impaired      SPECIAL CARE FACTORS FREQUENCY                       Contractures Contractures Info: Not present    Additional Factors Info  Code Status, Allergies Code Status Info: DNR 02/26/18 by Dr. Lavera Guise Allergies Info: clarithromycin, sulfa, oxycontin           Current Medications (07/01/2018):  This is the current hospital active medication list Current Facility-Administered Medications  Medication Dose Route Frequency Provider Last Rate Last Dose  . ceFEPIme (MAXIPIME) 1 g in sodium chloride 0.9 % 100 mL IVPB  1 g Intravenous Once Schuyler Amor, MD      . vancomycin (VANCOCIN) IVPB 1000 mg/200 mL premix  1,000 mg Intravenous Once Lu Duffel, [facility]       Current Outpatient Medications  Medication Sig Dispense Refill  . aspirin EC 81 MG tablet Take 81 mg by mouth daily.    . citalopram (CELEXA) 20 MG tablet Take 1 tablet by mouth daily.     Marland Kitchen donepezil (ARICEPT) 10 MG tablet Take 1 tablet by mouth at bedtime.     . fluticasone (FLONASE) 50 MCG/ACT nasal spray Place 1 spray into both nostrils daily.     . furosemide (  LASIX) 20 MG tablet Take 2 tablets (40 mg total) by mouth daily for 30 days. 60 tablet 0  . galantamine (RAZADYNE) 4 MG tablet Take 4 mg by mouth 2 (two) times daily with a meal.    . hydroxypropyl methylcellulose / hypromellose (ISOPTO TEARS / GONIOVISC) 2.5 % ophthalmic solution Place 1 drop into both eyes 4 (four) times daily as needed for dry eyes.    . Infant Care Products (DERMACLOUD EX) Apply 1 application topically daily as needed. Apply to left lower extremities    . ipratropium (ATROVENT HFA) 17 MCG/ACT inhaler Inhale 2 puffs into the lungs 3 (three) times daily.    . metoprolol tartrate  (LOPRESSOR) 25 MG tablet Take 1 tablet (25 mg total) by mouth 2 (two) times daily. 60 tablet 0  . pantoprazole (PROTONIX) 40 MG tablet Take 1 tablet (40 mg total) by mouth daily for 30 days. 30 tablet 0  . QUEtiapine (SEROQUEL) 50 MG tablet Take 50 mg by mouth at bedtime.     . sennosides-docusate sodium (SENOKOT-S) 8.6-50 MG tablet Take 1 tablet by mouth 2 (two) times daily.     . simvastatin (ZOCOR) 20 MG tablet Take 1 tablet by mouth daily at 8 pm.     . traMADol (ULTRAM) 50 MG tablet Take 1 tablet (50 mg total) by mouth every 12 (twelve) hours as needed for moderate pain. 15 tablet 0  . valACYclovir (VALTREX) 500 MG tablet Take 500 mg by mouth 2 (two) times daily.       Discharge Medications: Please see discharge summary for a list of discharge medications.  Relevant Imaging Results:  Relevant Lab Results:   Additional Information    Collie SiadAngela Zivah Mayr, RN

## 2018-07-01 NOTE — H&P (Signed)
[facility] [place] [facility]   PATIENT NAME: Rita Vang    MR#:  [id number]  DATE OF BIRTH:  01/10/1925  DATE OF ADMISSION:  07/01/2018  PRIMARY CARE PHYSICIAN: Gracelyn NurseJohnston, John D, MD   REQUESTING/REFERRING PHYSICIAN: Dr. Alphonzo LemmingsMcShane.  CHIEF COMPLAINT:   Chief Complaint  Patient presents with  . Shortness of Breath   Worsening shortness of breath. HISTORY OF PRESENT ILLNESS:  Rita Vang  is a 85 y.o. female with a known history of CVA, dementia, GERD, depression, GI bleeding, hypertension and hyperlipidemia etc.  The patient presented the ED with above chief complaints.  She is confused, unable to provide any information.  According to Dr. Alphonzo LemmingsMcShane, the patient was recently hospitalized.  She is found hypoxia and put on oxygen by nasal cannula 4 L and urine nonrebreather.  Chest x-ray show bilateral pneumonia.  The patient is treated with cefepime and vancomycin and Solu-Medrol in the ED.  COVID-19 test is negative.  Dr. Alphonzo LemmingsMcShane requested admission PAST MEDICAL HISTORY:   Past Medical History:  Diagnosis Date  . Anxiety   . Anxiety disorder    unspecified  . Cerebral infarction (HCC)   . CVA (cerebral vascular accident) (HCC)   . Dementia (HCC)   . Depression   . GERD (gastroesophageal reflux disease)   . GI bleed   . History of recurrent UTIs   . HLD (hyperlipidemia)   . HTN (hypertension)   . Vertigo    syncope    PAST SURGICAL HISTORY:   Past Surgical History:  Procedure Laterality Date  . CARPAL TUNNEL RELEASE Right 1995   endoscopic   . CARPAL TUNNEL RELEASE Left 04/30/2013   endoscopic , Dr. Rosita KeaMenz   . CATARACT EXTRACTION Right   . CHOLECYSTECTOMY    . ELBOW SURGERY Left   . ERCP W/ SPHINCTEROTOMY AND BALLOON DILATION  2008   and endoscopy  . ESOPHAGOGASTRODUODENOSCOPY (EGD) WITH PROPOFOL N/A 05/01/2017   Procedure: ESOPHAGOGASTRODUODENOSCOPY (EGD) WITH PROPOFOL;  Surgeon: Pasty Spillersahiliani, Varnita B, MD;  Location: [facility] ENDOSCOPY;   Service: Endoscopy;  Laterality: N/A;  . ORIF DISTAL RADIUS FRACTURE Right 2002  . TOTAL KNEE ARTHROPLASTY Left 11/23/2007  . TRIGGER FINGER RELEASE  04/30/2013   third digit, incision tendon sheath for trigger finger    SOCIAL HISTORY:   Social History   Tobacco Use  . Smoking status: Never Smoker  . Smokeless tobacco: Never Used  . Tobacco comment: quit at age 85  Substance Use Topics  . Alcohol use: Never    Frequency: Never    FAMILY HISTORY:   Family History  Problem Relation Age of Onset  . Heart attack Father   . Alzheimer's disease Mother     DRUG ALLERGIES:   Allergies  Allergen Reactions  . Sulfamethoxazole-Trimethoprim Other (See Comments)    Chest pain  . Oxycontin [Oxycodone Hcl] Other (See Comments)    Hallucinations   . Clarithromycin Other (See Comments)    Abdominal pain     REVIEW OF SYSTEMS:   Review of Systems  Unable to perform ROS: Mental status change    MEDICATIONS AT HOME:   Prior to Admission medications   Medication Sig Start Date End Date Taking? Authorizing Provider  aspirin EC 81 MG tablet Take 81 mg by mouth daily.    [provider]  citalopram (CELEXA) 20 MG tablet Take 1 tablet by mouth daily.  12/30/13   [provider]  donepezil (ARICEPT) 10 MG tablet Take 1 tablet by  mouth at bedtime.  02/02/14   [provider]  fluticasone (FLONASE) 50 MCG/ACT nasal spray Place 1 spray into both nostrils daily.  02/24/14   [provider]  furosemide (LASIX) 20 MG tablet Take 2 tablets (40 mg total) by mouth daily for 30 days. 01/31/18 03/02/18  Ihor AustinPyreddy, Pavan, MD  galantamine (RAZADYNE) 4 MG tablet Take 4 mg by mouth 2 (two) times daily with a meal.    [provider]  hydroxypropyl methylcellulose / hypromellose (ISOPTO TEARS / GONIOVISC) 2.5 % ophthalmic solution Place 1 drop into both eyes 4 (four) times daily as needed for dry eyes.    [provider]  Infant Care Products  [facility](DERMACLOUD EX) Apply 1 application topically daily as needed. Apply to left lower extremities    [provider]  ipratropium (ATROVENT HFA) 17 MCG/ACT inhaler Inhale 2 puffs into the lungs 3 (three) times daily.    [provider]  metoprolol tartrate (LOPRESSOR) 25 MG tablet Take 1 tablet (25 mg total) by mouth 2 (two) times daily. 05/07/17   Altamese DillingVachhani, Vaibhavkumar, MD  pantoprazole (PROTONIX) 40 MG tablet Take 1 tablet (40 mg total) by mouth daily for 30 days. 01/31/18 03/02/18  Ihor AustinPyreddy, Pavan, MD  QUEtiapine (SEROQUEL) 50 MG tablet Take 50 mg by mouth at bedtime.     [provider]  sennosides-docusate sodium (SENOKOT-S) 8.6-50 MG tablet Take 1 tablet by mouth 2 (two) times daily.     [provider]  simvastatin (ZOCOR) 20 MG tablet Take 1 tablet by mouth daily at 8 pm.  04/22/17   [provider]  traMADol (ULTRAM) 50 MG tablet Take 1 tablet (50 mg total) by mouth every 12 (twelve) hours as needed for moderate pain. 01/31/18   Ihor AustinPyreddy, Pavan, MD  valACYclovir (VALTREX) 500 MG tablet Take 500 mg by mouth 2 (two) times daily.    [provider]      VITAL SIGNS:  Blood pressure 133/60, pulse 73, temperature 98.5 F (36.9 C), temperature source Oral, resp. rate 14, height 5\' 4"  (1.626 m), weight 54.9 kg, SpO2 100 %.  PHYSICAL EXAMINATION:  Physical Exam  GENERAL:  85 y.o.-year-old patient lying in the bed with no acute distress.  Looks fragile. EYES: Pupils equal, round, reactive to light and accommodation. No scleral icterus. Extraocular muscles intact.  HEENT: Head atraumatic, normocephalic.  NECK:  Supple, no jugular venous distention. No thyroid enlargement, no tenderness.  LUNGS: Normal breath sounds bilaterally, no wheezing, rales,rhonchi or crepitation. No use of accessory muscles of respiration.  CARDIOVASCULAR: S1, S2 normal. No murmurs, rubs, or gallops.  ABDOMEN: Soft, nontender, nondistended. Bowel sounds present. No  organomegaly or mass.  EXTREMITIES: No pedal edema, cyanosis, or clubbing.  NEUROLOGIC: Unable to exam. PSYCHIATRIC: The patient is confused.  SKIN: No obvious rash, lesion, or ulcer.   LABORATORY PANEL:   CBC Recent Labs  Lab 07/01/18 1132  WBC 16.8*  HGB 8.2*  HCT 29.2*  PLT 179   ------------------------------------------------------------------------------------------------------------------  Chemistries  Recent Labs  Lab 07/01/18 1132  NA 143  K 5.0  CL 100  CO2 32  GLUCOSE 92  BUN 32*  CREATININE 1.20*  CALCIUM 8.6*   ------------------------------------------------------------------------------------------------------------------  Cardiac Enzymes Recent Labs  Lab 07/01/18 1132  TROPONINI 0.03*   ------------------------------------------------------------------------------------------------------------------  RADIOLOGY:  Dg Chest Port 1 View  Result Date: 07/01/2018 CLINICAL DATA:  Worsening shortness of breath. EXAM: PORTABLE CHEST 1 VIEW COMPARISON:  01/27/2018 FINDINGS: The heart is enlarged but stable. There is marked tortuosity  and calcification of the thoracic aorta. Patchy bilateral lung infiltrates, right greater than left with probable small effusions. Chronic underlying lung changes. The bony thorax is intact. Remote healed bilateral rib fractures are noted. IMPRESSION: Stable cardiac enlargement and central vascular congestion along with underlying lung disease. Patchy bilateral infiltrates, right greater than left and probable small effusions. Electronically Signed   By: Marijo Sanes M.D.   On: 07/01/2018 12:53      IMPRESSION AND PLAN:   Acute respiratory failure with hypoxia due to bilateral pneumonia with leukocytosis, HAP. The patient will be admitted to medical floor. Continue oxygen by nasal cannula, albuterol inhaler as needed, continue antibiotics, Robitussin as needed, follow-up CBC and cultures. Lactic acidosis.  Follow-up lactic  acid level, treatment as above. Dehydration.  IV fluid support. Anemia of chronic disease.  Stable. Hypertension.  Continue hypertension medication.  I tried to call the patient's son, but nobody answered the phone.   According to previous documentation, the patient is partial code. All the records are reviewed and case discussed with ED provider. Management plans discussed with the patient, family and they are in agreement.  CODE STATUS: Partial code.  TOTAL TIME TAKING CARE OF THIS PATIENT: 45 minutes.    Demetrios Loll M.D on 07/01/2018 at 1:39 PM  Between 7am to 6pm - Pager - [phone]  After 6pm go to [email or web address] - Proofreader  [facility]  Office  [phone]  CC: Primary care physician; Baxter Hire, MD   Note: This dictation was prepared with Dragon dictation along with smaller phrase technology. Any transcriptional errors that result from this process are unin

## 2018-07-01 NOTE — Progress Notes (Signed)
Advanced Care Plan.  Purpose of Encounter: CODE STATUS. Parties in Attendance: The patient, her son and me. Patient's Decisional Capacity: NO. Medical Story: Rita Vang  is a 85 y.o. female with a known history of CVA, dementia, GERD, depression, GI bleeding, hypertension and hyperlipidemia etc.  The patient is admitted for acute respiratory failure with hypoxia due to bilateral pneumonia with leukocytosis, lactic acidosis, dehydration.  I discussed with the patient's son about her current condition, poor prognosis and CODE STATUS.  The patient's son want patient to be intubated but no CPR or defibrillation. Plan:  Code Status: PARTIAL CODE. Time spent discussing advance care planning: 20 minutes.

## 2018-07-01 NOTE — ED Notes (Signed)
ED TO INPATIENT HANDOFF REPORT  ED Nurse Name and Phone #: Ariel [phone]  S Name/Age/Gender Rita Vang 85 y.o. female Room/Bed: ED06A/ED06A  Code Status   Code Status: Prior  Home/SNF/Other Skilled nursing facility Patient oriented to: self and place Is this baseline? Yes   Triage Complete: Triage complete  Chief Complaint sob  Triage Note Pt is a hospice pt [facility]- pt normally wears 2L [place] but was having increasing SHOB x2 days- EMS reports 92% on 6L [place]- pt currently on nonrebreather at 15L satting 100%   Allergies Allergies  Allergen Reactions  . Sulfamethoxazole-Trimethoprim Other (See Comments)    Chest pain  . Oxycontin [Oxycodone Hcl] Other (See Comments)    Hallucinations   . Clarithromycin Other (See Comments)    Abdominal pain     Level of Care/Admitting Diagnosis ED Disposition    ED Disposition Condition Comment   Admit  Hospital Area: [facility] [[id number]]  Level of Care: Telemetry [5]  Covid Evaluation: Confirmed COVID Negative  Diagnosis: Pneumonia [227785]  Admitting Physician: Shaune PollackCHEN, QING [[id number]]  Attending Physician: Shaune PollackCHEN, QING [[id number]]  Estimated length of stay: past midnight tomorrow  Certification:: I certify this patient will need inpatient services for at least 2 midnights  PT Class (Do Not Modify): Inpatient [101]  PT Acc Code (Do Not Modify): Private [1]       B Medical/Surgery History Past Medical History:  Diagnosis Date  . Anxiety   . Anxiety disorder    unspecified  . Cerebral infarction (HCC)   . CVA (cerebral vascular accident) (HCC)   . Dementia (HCC)   . Depression   . GERD (gastroesophageal reflux disease)   . GI bleed   . History of recurrent UTIs   . HLD (hyperlipidemia)   . HTN (hypertension)   . Vertigo    syncope   Past Surgical History:  Procedure Laterality Date  . CARPAL TUNNEL RELEASE Right 1995   endoscopic   . CARPAL TUNNEL RELEASE Left 04/30/2013    endoscopic , Dr. Rosita KeaMenz   . CATARACT EXTRACTION Right   . CHOLECYSTECTOMY    . ELBOW SURGERY Left   . ERCP W/ SPHINCTEROTOMY AND BALLOON DILATION  2008   and endoscopy  . ESOPHAGOGASTRODUODENOSCOPY (EGD) WITH PROPOFOL N/A 05/01/2017   Procedure: ESOPHAGOGASTRODUODENOSCOPY (EGD) WITH PROPOFOL;  Surgeon: Pasty Spillersahiliani, Varnita B, MD;  Location: [facility] ENDOSCOPY;  Service: Endoscopy;  Laterality: N/A;  . ORIF DISTAL RADIUS FRACTURE Right 2002  . TOTAL KNEE ARTHROPLASTY Left 11/23/2007  . TRIGGER FINGER RELEASE  04/30/2013   third digit, incision tendon sheath for trigger finger     A IV Location/Drains/Wounds Patient Lines/Drains/Airways Status   Active Line/Drains/Airways    Name:   Placement date:   Placement time:   Site:   Days:   Peripheral IV 07/01/18 Left Wrist   07/01/18    1128    Wrist   less than 1   Peripheral IV 07/01/18 Right Hand   07/01/18    1138    Hand   less than 1   External Urinary Catheter   05/02/17    1707    -   425   External Urinary Catheter   -    -    -      Pressure Injury 03/06/17 Stage II -  Partial thickness loss of dermis presenting as a shallow open ulcer with a red, pink wound bed without slough.   03/06/17    2326  482   Pressure Injury 06/17/17 Stage II -  Partial thickness loss of dermis presenting as a shallow open ulcer with a red, pink wound bed without slough.   06/17/17    1850     379   Pressure Injury 01/29/18 Stage I -  Intact skin with non-blanchable redness of a localized area usually over a bony prominence.   01/29/18    1429     153   Pressure Injury 01/31/18 Stage I -  Intact skin with non-blanchable redness of a localized area usually over a bony prominence.   01/31/18    0807     151          Intake/Output Last 24 hours No intake or output data in the 24 hours ending 07/01/18 1417  Labs/Imaging Results for orders placed or performed during the hospital encounter of 07/01/18 (from the past 48 hour(s))  Basic metabolic panel     Status:  Abnormal   Collection Time: 07/01/18 11:32 AM  Result Value Ref Range   Sodium 143 135 - 145 mmol/L   Potassium 5.0 3.5 - 5.1 mmol/L   Chloride 100 98 - 111 mmol/L   CO2 32 22 - 32 mmol/L   Glucose, Bld 92 70 - 99 mg/dL   BUN 32 (H) 8 - 23 mg/dL   Creatinine, Ser 1.611.20 (H) 0.44 - 1.00 mg/dL   Calcium 8.6 (L) 8.9 - 10.3 mg/dL   GFR calc non Af Amer 39 (L) >60 mL/min   GFR calc Af Amer 46 (L) >60 mL/min   Anion gap 11 5 - 15    Comment: Performed at [facility], [address] [place], [place] [address]  CBC     Status: Abnormal   Collection Time: 07/01/18 11:32 AM  Result Value Ref Range   WBC 16.8 (H) 4.0 - 10.5 K/uL   RBC 3.45 (L) 3.87 - 5.11 MIL/uL   Hemoglobin 8.2 (L) 12.0 - 15.0 g/dL   HCT 54.029.2 (L) 98.136.0 - 19.146.0 %   MCV 84.6 80.0 - 100.0 fL   MCH 23.8 (L) 26.0 - 34.0 pg   MCHC 28.1 (L) 30.0 - 36.0 g/dL   RDW 47.818.6 (H) 29.511.5 - 62.115.5 %   Platelets 179 150 - 400 K/uL   nRBC 0.0 0.0 - 0.2 %    Comment: Performed at [facility], [address] [place], [place] [address]  Troponin I - ONCE - STAT     Status: Abnormal   Collection Time: 07/01/18 11:32 AM  Result Value Ref Range   Troponin I 0.03 (HH) <0.03 ng/mL    Comment: CRITICAL RESULT CALLED TO, READ BACK BY AND VERIFIED WITH ARIEL WALLACE @1220  ON 07/01/2018 BY FMW Performed at [facility], [address] [place], [place] [address]   Protime-INR (order if [name] taking Coumadin / Warfarin)     Status: Abnormal   Collection Time: 07/01/18 11:32 AM  Result Value Ref Range   Prothrombin Time 16.1 (H) 11.4 - 15.2 seconds   INR 1.3 (H) 0.8 - 1.2    Comment: (NOTE) INR goal varies based on device and disease states. Performed at [facility], [address] [place], [place] [address]   SARS Coronavirus 2 (CEPHEID- Performed in [facility]), [facility] Order     Status: None   Collection Time: 07/01/18 11:32 AM   Specimen: Nasopharyngeal Swab  Result Value Ref Range    SARS Coronavirus 2 NEGATIVE NEGATIVE  Comment: (NOTE) If result is NEGATIVE SARS-CoV-2 target nucleic acids are NOT DETECTED. The SARS-CoV-2 RNA is generally detectable in upper and lower  respiratory specimens during the acute phase of infection. The lowest  concentration of SARS-CoV-2 viral copies this assay can detect is 250  copies / mL. A negative result does not preclude SARS-CoV-2 infection  and should not be used as the sole basis for treatment or other  patient management decisions.  A negative result may occur with  improper specimen collection / handling, submission of specimen other  than nasopharyngeal swab, presence of viral mutation(s) within the  areas targeted by this assay, and inadequate number of viral copies  (<250 copies / mL). A negative result must be combined with clinical  observations, patient history, and epidemiological information. If result is POSITIVE SARS-CoV-2 target nucleic acids are DETECTED. The SARS-CoV-2 RNA is generally detectable in upper and lower  respiratory specimens dur ing the acute phase of infection.  Positive  results are indicative of active infection with SARS-CoV-2.  Clinical  correlation with patient history and other diagnostic information is  necessary to determine patient infection status.  Positive results do  not rule out bacterial infection or co-infection with other viruses. If result is PRESUMPTIVE POSTIVE SARS-CoV-2 nucleic acids MAY BE PRESENT.   A presumptive positive result was obtained on the submitted specimen  and confirmed on repeat testing.  While 2019 novel coronavirus  (SARS-CoV-2) nucleic acids may be present in the submitted sample  additional confirmatory testing may be necessary for epidemiological  and / or clinical management purposes  to differentiate between  SARS-CoV-2 and other Sarbecovirus currently known to infect humans.  If clinically indicated additional testing with an alternate test   methodology [phone]) is advised. The SARS-CoV-2 RNA is generally  detectable in upper and lower respiratory sp ecimens during the acute  phase of infection. The expected result is Negative. Fact Sheet for Patients:  StrictlyIdeas.no Fact Sheet for Healthcare Providers: BankingDealers.co.za This test is not yet approved or cleared by the [place] FDA and has been authorized for detection and/or diagnosis of SARS-CoV-2 by FDA under an Emergency Use Authorization (EUA).  This EUA will remain in effect (meaning this test can be used) for the duration of the COVID-19 declaration under Section 564(b)(1) of the Act, 21 U.S.C. section 360bbb-3(b)(1), unless the authorization is terminated or revoked sooner. Performed at [facility], [address] [place], [place] [address]   Lactic acid, plasma     Status: Abnormal   Collection Time: 07/01/18 11:32 AM  Result Value Ref Range   Lactic Acid, Venous 2.3 (HH) 0.5 - 1.9 mmol/L    Comment: CRITICAL RESULT CALLED TO, READ BACK BY AND VERIFIED WITH Laren Boom RN AT 5885 ON 07/01/18 SG/FMW Performed at [facility], [address] [place], [place] [address]   Brain natriuretic peptide     Status: Abnormal   Collection Time: 07/01/18 11:32 AM  Result Value Ref Range   B Natriuretic Peptide 1,175.0 (H) 0.0 - 100.0 pg/mL    Comment: Performed at [facility], [address] [place], [place] [address]   Dg Chest Port 1 View  Result Date: 07/01/2018 CLINICAL DATA:  Worsening shortness of breath. EXAM: PORTABLE CHEST 1 VIEW COMPARISON:  01/27/2018 FINDINGS: The heart is enlarged but stable. There is marked tortuosity and calcification of the thoracic aorta. Patchy bilateral lung infiltrates, right greater than left with probable small effusions. Chronic underlying lung changes. The bony thorax  is intact. Remote healed bilateral rib fractures are noted. IMPRESSION:  Stable cardiac enlargement and central vascular congestion along with underlying lung disease. Patchy bilateral infiltrates, right greater than left and probable small effusions. Electronically Signed   By: Rudie MeyerP.  Gallerani M.D.   On: 07/01/2018 12:53    Pending Labs Unresulted Labs (From admission, onward)    Start     Ordered   07/01/18 1221  Lactic acid, plasma  Now then every 2 hours,   STAT     07/01/18 1220   07/01/18 1221  Culture, blood (routine x 2)  BLOOD CULTURE X 2,   STAT     07/01/18 1220   Signed and Held  Creatinine, serum  (heparin)  Once,   R    Comments: Baseline for heparin therapy IF NOT ALREADY DRAWN.    Signed and Held   Signed and Held  Basic metabolic panel  Tomorrow morning,   R     Signed and Held   Signed and Held  CBC  Tomorrow morning,   R     Signed and Held   Signed and Held  Culture, sputum-assessment  Once,   R    Question:  Patient immune status  Answer:  Immunocompromised   Signed and Held   Signed and Held  Legionella Urine Antigen  (Severe pneumonia (requires ICU care) in adults without resistant organism risk factors )  Once,   STAT     Signed and Held   Signed and Held  Strep pneumoniae urinary antigen  (Severe pneumonia (requires ICU care) in adults without resistant organism risk factors )  Once,   STAT     Signed and Held          Vitals/Pain Today's Vitals   07/01/18 1130 07/01/18 1131 07/01/18 1137 07/01/18 1145  BP:   133/60   Pulse:   73   Resp:   14   Temp:    98.5 F (36.9 C)  TempSrc:    Oral  SpO2: 100%  100%   Weight:  54.9 kg    Height:  5\' 4"  (1.626 m)      Isolation Precautions Droplet and Contact precautions  Medications Medications  ceFEPIme (MAXIPIME) 1 g in sodium chloride 0.9 % 100 mL IVPB (1 g Intravenous New Bag/Given 07/01/18 1403)  vancomycin (VANCOCIN) IVPB 1000 mg/200 mL premix (1,000 mg Intravenous New Bag/Given 07/01/18 1408)  vancomycin (VANCOCIN) IVPB 1000 mg/200 mL premix (has no administration in  time range)  ipratropium (ATROVENT) nebulizer solution 0.5 mg (0.5 mg Nebulization Given 07/01/18 1314)  albuterol (PROVENTIL) (2.5 MG/3ML) 0.083% nebulizer solution 5 mg (5 mg Nebulization Given 07/01/18 1313)  methylPREDNISolone sodium succinate (SOLU-MEDROL) 125 mg/2 mL injection 125 mg (125 mg Intravenous Given 07/01/18 1309)  sodium chloride 0.9 % bolus 500 mL (500 mLs Intravenous New Bag/Given 07/01/18 1414)    Mobility non-ambulatory Moderate fall risk   Focused Assessments Pulmonary Assessment Handoff:  Lung sounds:   O2 Device: NRB        R Recommendations: See Admitting Provider Note  Report given to:   Additional Notes: pt is hospice pt but family wishes her to be intubated if needed but no CPR

## 2018-07-02 LAB — BASIC METABOLIC PANEL
Anion gap: 8 (ref 5–15)
BUN: 40 mg/dL — ABNORMAL HIGH (ref 8–23)
CO2: 29 mmol/L (ref 22–32)
Calcium: 8.4 mg/dL — ABNORMAL LOW (ref 8.9–10.3)
Chloride: 103 mmol/L (ref 98–111)
Creatinine, Ser: 1.35 mg/dL — ABNORMAL HIGH (ref 0.44–1.00)
GFR calc Af Amer: 40 mL/min — ABNORMAL LOW (ref >60)
GFR calc non Af Amer: 34 mL/min — ABNORMAL LOW (ref >60)
Glucose, Bld: 86 mg/dL (ref 70–99)
Potassium: 5.7 mmol/L — ABNORMAL HIGH (ref 3.5–5.1)
Sodium: 140 mmol/L (ref 135–145)

## 2018-07-02 LAB — CBC
HCT: 29.5 % — ABNORMAL LOW (ref 36.0–46.0)
Hemoglobin: 8.2 g/dL — ABNORMAL LOW (ref 12.0–15.0)
MCH: 23.7 pg — ABNORMAL LOW (ref 26.0–34.0)
MCHC: 27.8 g/dL — ABNORMAL LOW (ref 30.0–36.0)
MCV: 85.3 fL (ref 80.0–100.0)
Platelets: 171 10*3/uL (ref 150–400)
RBC: 3.46 MIL/uL — ABNORMAL LOW (ref 3.87–5.11)
RDW: 18.6 % — ABNORMAL HIGH (ref 11.5–15.5)
WBC: 17 10*3/uL — ABNORMAL HIGH (ref 4.0–10.5)
nRBC: 0 % (ref 0.0–0.2)

## 2018-07-02 LAB — STREP PNEUMONIAE URINARY ANTIGEN: Strep Pneumo Urinary Antigen: NEGATIVE

## 2018-07-02 MED ORDER — SODIUM ZIRCONIUM CYCLOSILICATE 5 G PO PACK
5.0000 g | PACK | Freq: Once | ORAL | Status: AC
  Administered 2018-07-02: 5 g via ORAL
  Filled 2018-07-02: qty 1

## 2018-07-02 MED ORDER — SODIUM CHLORIDE 0.9 % IV SOLN
INTRAVENOUS | Status: DC | PRN
  Administered 2018-07-02: 10 mL via INTRAVENOUS
  Administered 2018-07-03: 20 mL via INTRAVENOUS

## 2018-07-02 NOTE — Progress Notes (Addendum)
Writer spoke with pt's daughter, Barnett Applebaum, and provided her with a status update.  Pt spoke via telephone with her daughter, Barnett Applebaum.  Pt pleasant and appropriate while on the phone.  Pt agreed to take medications.  Pt co-operated and took her medication given crushed in applesauce. She continued to refuse her Flonase.

## 2018-07-02 NOTE — Progress Notes (Signed)
[facility] -  at [facility]   PATIENT NAME: Rita Vang    MR#:  [id number]  DATE OF BIRTH:  06/10/1924  SUBJECTIVE:  CHIEF COMPLAINT:  SOB is better than before, agreed to take meds after she spoke to her daughter.  REVIEW OF SYSTEMS:  ROS limited  RESPIRATORY: improving cough, shortness of breath, no wheezing or hemoptysis.  CARDIOVASCULAR: No chest pain, orthopnea, edema.  GASTROINTESTINAL: No nausea, vomiting, diarrhea or abdominal pain.  SKIN: No rash or lesion. MUSCULOSKELETAL: No joint pain or arthritis.   NEUROLOGIC: No tingling, numbness, weakness.    DRUG ALLERGIES:   Allergies  Allergen Reactions  . Sulfamethoxazole-Trimethoprim Other (See Comments)    Chest pain  . Oxycontin [Oxycodone Hcl] Other (See Comments)    Hallucinations   . Clarithromycin Other (See Comments)    Abdominal pain     VITALS:  Blood pressure 104/60, pulse 65, temperature 98.5 F (36.9 C), temperature source Oral, resp. rate 20, height 5\' 4"  (1.626 m), weight 56.5 kg, SpO2 93 %.  PHYSICAL EXAMINATION:  GENERAL:  85 y.o.-year-old patient lying in the bed with no acute distress.  EYES: Pupils equal, round, reactive to light and accommodation. No scleral icterus. Extraocular muscles intact.  HEENT: Head atraumatic, normocephalic. Oropharynx and nasopharynx clear.  NECK:  Supple, no jugular venous distention. No thyroid enlargement, no tenderness.  LUNGS: Normal breath sounds bilaterally, no wheezing, rales,rhonchi or crepitation. No use of accessory muscles of respiration.  CARDIOVASCULAR: S1, S2 normal. No murmurs, rubs, or gallops.  ABDOMEN: Soft, nontender, nondistended. Bowel sounds present.   EXTREMITIES: No pedal edema, cyanosis, or clubbing.  NEUROLOGIC: awake and alert  Sensation intact. Gait not checked for safety  PSYCHIATRIC: The patient is alert and oriented x1-2   SKIN: No obvious rash, lesion, or ulcer.    LABORATORY PANEL:    CBC Recent Labs  Lab 07/02/18 1132  WBC 17.0*  HGB 8.2*  HCT 29.5*  PLT 171   ------------------------------------------------------------------------------------------------------------------  Chemistries  Recent Labs  Lab 07/02/18 1132  NA 140  K 5.7*  CL 103  CO2 29  GLUCOSE 86  BUN 40*  CREATININE 1.35*  CALCIUM 8.4*   ------------------------------------------------------------------------------------------------------------------  Cardiac Enzymes Recent Labs  Lab 07/01/18 1132  TROPONINI 0.03*   ------------------------------------------------------------------------------------------------------------------  RADIOLOGY:  Dg Chest Port 1 View  Result Date: 07/01/2018 CLINICAL DATA:  Worsening shortness of breath. EXAM: PORTABLE CHEST 1 VIEW COMPARISON:  01/27/2018 FINDINGS: The heart is enlarged but stable. There is marked tortuosity and calcification of the thoracic aorta. Patchy bilateral lung infiltrates, right greater than left with probable small effusions. Chronic underlying lung changes. The bony thorax is intact. Remote healed bilateral rib fractures are noted. IMPRESSION: Stable cardiac enlargement and central vascular congestion along with underlying lung disease. Patchy bilateral infiltrates, right greater than left and probable small effusions. Electronically Signed   By: Rudie MeyerP.  Gallerani M.D.   On: 07/01/2018 12:53    EKG:   Orders placed or performed during the hospital encounter of 07/01/18  . ED EKG  . ED EKG    ASSESSMENT AND PLAN:   Acute respiratory failure with hypoxia due to bilateral pneumonia with leukocytosis, Continue oxygen by nasal cannula, albuterol inhaler as needed, continue antibiotics, Robitussin as needed  follow-up CBC and cultures.  Hyperkalemia - k 5.5 - lokelma , am labs   Lactic acidosis.  Follow-up lactic acid level, treatment as above.  Dehydration.  IV fluid support.  Anemia of chronic disease.   Stable.  Hypertension.  hypertension medications if BP is stable     All the records are reviewed and case discussed with Care Management/Social Workerr. Management plans discussed with the patient, family and they are in agreement.  CODE STATUS: partial code   TOTAL TIME TAKING CARE OF THIS PATIENT: 36  minutes.   POSSIBLE D/C IN 2  DAYS, DEPENDING ON CLINICAL CONDITION.  Note: This dictation was prepared with Dragon dictation along with smaller phrase technology. Any transcriptional errors that result from this process are unintentional.   Nicholes Mango M.D on 07/02/2018 at 7:04 PM  Between 7am to 6pm - Pager - [phone] After 6pm go to [email or web address] - password [email or web address] [facility]  Office  [phone]  CC: Primary care physician; Baxter Hire, MD

## 2018-07-02 NOTE — Progress Notes (Addendum)
SLP Cancellation Note  Patient Details Name: Rita Vang MRN: [id number] DOB: Jun 16, 1924   Cancelled treatment:       Reason Eval/Treat Not Completed: Fatigue/lethargy limiting ability to participate;Other (comment); Chart reviewed. Pt with current bilateral pneumonia, currently on Regular diet with thin liquids, BSE requested due to report of possible choking on meat at SNF. Upon entering room, SLP found pt sleeping diagonally in bed, with knees rigidly bent at nearly 90 degrees. SLP & NT repositioned pt in bed, pt frequently crying out "No, No!" Pt intermittently opened eyes brightly briefly and then tightly closed eyes again, and continued to repeat "No, no!" despite reassurances and redirection. After sitting pt fully upright, attempted PO trials; however pt continued to forcefully state "No, No!" in response to any question, comment or command. Unable to complete BSE at this time, will re-attempt if time permits today, or will re-attempt tomorrow.  Will downgrade pt's diet to Dysphagia I (puree) with nectar thick liquids as a precaution, rec PO meds be given crushed in puree due to marked decreased cognition & awareness and decreased respiratory status. Discussed above with nsg & MD, nsg to attempt to feed pt slowly when alert with strict aspiration precautions. SLP to f/u and re-attempt to assess tomorrow.    Vivian Neuwirth, MA, CCC-SLP 07/02/2018, 9:34 AM

## 2018-07-02 NOTE — Progress Notes (Signed)
Spoke with pt's son, Nicki Reaper, who is also her POA and caregiver.  Status update provided.  Due to pt refusing to allow staff to care for her, reposition, take medication, eat meals, be assessed by SLP and have lab work drawn, he is agreeable to come and sit with her.  Discussed same with Dr. Margaretmary Eddy and unit director, who are agreeable to plan to have caregiver at beside.

## 2018-07-02 NOTE — Plan of Care (Signed)
Pt refusing to take medications, change position, eat, or allow lab to draw blood. Pt is confused x4.   Problem: Education: Goal: Knowledge of General Education information will improve Description: Including pain rating scale, medication(s)/side effects and non-pharmacologic comfort measures 07/02/2018 1313 by Aubery Lapping, RN Outcome: Not Progressing 07/02/2018 1312 by Aubery Lapping, RN Outcome: Progressing   Problem: Health Behavior/Discharge Planning: Goal: Ability to manage health-related needs will improve 07/02/2018 1313 by Aubery Lapping, RN Outcome: Not Progressing 07/02/2018 1312 by Aubery Lapping, RN Outcome: Progressing   Problem: Clinical Measurements: Goal: Ability to maintain clinical measurements within normal limits will improve 07/02/2018 1313 by Aubery Lapping, RN Outcome: Not Progressing 07/02/2018 1312 by Aubery Lapping, RN Outcome: Progressing Goal: Will remain free from infection 07/02/2018 1313 by Aubery Lapping, RN Outcome: Not Progressing 07/02/2018 1312 by Aubery Lapping, RN Outcome: Progressing Goal: Diagnostic test results will improve 07/02/2018 1313 by Aubery Lapping, RN Outcome: Not Progressing 07/02/2018 1312 by Aubery Lapping, RN Outcome: Progressing Goal: Respiratory complications will improve 07/02/2018 1313 by Aubery Lapping, RN Outcome: Not Progressing 07/02/2018 1312 by Aubery Lapping, RN Outcome: Progressing Goal: Cardiovascular complication will be avoided 07/02/2018 1313 by Aubery Lapping, RN Outcome: Not Progressing 07/02/2018 1312 by Aubery Lapping, RN Outcome: Progressing   Problem: Activity: Goal: Risk for activity intolerance will decrease 07/02/2018 1313 by Aubery Lapping, RN Outcome: Not Progressing 07/02/2018 1312 by Aubery Lapping, RN Outcome: Progressing   Problem: Nutrition: Goal: Adequate nutrition will be maintained 07/02/2018 1313 by Aubery Lapping, RN Outcome: Not Progressing 07/02/2018 1312 by Aubery Lapping, RN Outcome: Progressing   Problem: Coping: Goal: Level of anxiety will decrease 07/02/2018 1313 by Aubery Lapping, RN Outcome: Not Progressing 07/02/2018 1312 by Aubery Lapping, RN Outcome: Progressing   Problem: Elimination: Goal: Will not experience complications related to bowel motility 07/02/2018 1313 by Aubery Lapping, RN Outcome: Not Progressing 07/02/2018 1312 by Aubery Lapping, RN Outcome: Progressing Goal: Will not experience complications related to urinary retention 07/02/2018 1313 by Aubery Lapping, RN Outcome: Not Progressing 07/02/2018 1312 by Aubery Lapping, RN Outcome: Progressing   Problem: Pain Managment: Goal: General experience of comfort will improve 07/02/2018 1313 by Aubery Lapping, RN Outcome: Not Progressing 07/02/2018 1312 by Aubery Lapping, RN Outcome: Progressing   Problem: Safety: Goal: Ability to remain free from injury will improve 07/02/2018 1313 by Aubery Lapping, RN Outcome: Not Progressing 07/02/2018 1312 by Aubery Lapping, RN Outcome: Progressing   Problem: Skin Integrity: Goal: Risk for impaired skin integrity will decrease 07/02/2018 1313 by Aubery Lapping, RN Outcome: Not Progressing 07/02/2018 1312 by Aubery Lapping, RN Outcome: Progressing

## 2018-07-03 ENCOUNTER — Inpatient Hospital Stay: Payer: Medicare Other

## 2018-07-03 LAB — BASIC METABOLIC PANEL
Anion gap: 7 (ref 5–15)
BUN: 36 mg/dL — ABNORMAL HIGH (ref 8–23)
CO2: 32 mmol/L (ref 22–32)
Calcium: 8.3 mg/dL — ABNORMAL LOW (ref 8.9–10.3)
Chloride: 105 mmol/L (ref 98–111)
Creatinine, Ser: 1.2 mg/dL — ABNORMAL HIGH (ref 0.44–1.00)
GFR calc Af Amer: 46 mL/min — ABNORMAL LOW (ref >60)
GFR calc non Af Amer: 39 mL/min — ABNORMAL LOW (ref >60)
Glucose, Bld: 97 mg/dL (ref 70–99)
Potassium: 4.1 mmol/L (ref 3.5–5.1)
Sodium: 144 mmol/L (ref 135–145)

## 2018-07-03 LAB — CBC
HCT: 30 % — ABNORMAL LOW (ref 36.0–46.0)
Hemoglobin: 8.2 g/dL — ABNORMAL LOW (ref 12.0–15.0)
MCH: 23.6 pg — ABNORMAL LOW (ref 26.0–34.0)
MCHC: 27.3 g/dL — ABNORMAL LOW (ref 30.0–36.0)
MCV: 86.5 fL (ref 80.0–100.0)
Platelets: 162 10*3/uL (ref 150–400)
RBC: 3.47 MIL/uL — ABNORMAL LOW (ref 3.87–5.11)
RDW: 18.7 % — ABNORMAL HIGH (ref 11.5–15.5)
WBC: 16.1 10*3/uL — ABNORMAL HIGH (ref 4.0–10.5)
nRBC: 0 % (ref 0.0–0.2)

## 2018-07-03 LAB — PROCALCITONIN: Procalcitonin: <0.1 ng/mL

## 2018-07-03 NOTE — Progress Notes (Signed)
[facility] [place] [facility]   PATIENT NAME: Rita Vang    MR#:  [id number]  DATE OF BIRTH:  06/04/1924  SUBJECTIVE:  CHIEF COMPLAINT:    Patient continues to be short of breath  REVIEW OF SYSTEMS:  ROS limited  RESPIRATORY: improving cough, shortness of breath, no wheezing or hemoptysis.  CARDIOVASCULAR: No chest pain, orthopnea, edema.  GASTROINTESTINAL: No nausea, vomiting, diarrhea or abdominal pain.  SKIN: No rash or lesion. MUSCULOSKELETAL: No joint pain or arthritis.   NEUROLOGIC: No tingling, numbness, weakness.    DRUG ALLERGIES:   Allergies  Allergen Reactions  . Sulfamethoxazole-Trimethoprim Other (See Comments)    Chest pain  . Oxycontin [Oxycodone Hcl] Other (See Comments)    Hallucinations   . Clarithromycin Other (See Comments)    Abdominal pain     VITALS:  Blood pressure 133/72, pulse 74, temperature 97.7 F (36.5 C), temperature source Oral, resp. rate 18, height 5\' 4"  (1.626 m), weight 56.5 kg, SpO2 99 %.  PHYSICAL EXAMINATION:  GENERAL:  85 y.o.-year-old patient lying in the bed with no acute distress.  EYES: Pupils equal, round, reactive to light and accommodation. No scleral icterus. Extraocular muscles intact.  HEENT: Head atraumatic, normocephalic. Oropharynx and nasopharynx clear.  NECK:  Supple, no jugular venous distention. No thyroid enlargement, no tenderness.  LUNGS: Normal breath sounds bilaterally, no wheezing, rales,rhonchi or crepitation. No use of accessory muscles of respiration.  CARDIOVASCULAR: S1, S2 normal. No murmurs, rubs, or gallops.  ABDOMEN: Soft, nontender, nondistended. Bowel sounds present.   EXTREMITIES: No pedal edema, cyanosis, or clubbing.  NEUROLOGIC: awake and alert  Sensation intact. Gait not checked for safety  PSYCHIATRIC: The patient is alert and oriented x1-2   SKIN: No obvious rash, lesion, or ulcer.    LABORATORY PANEL:   CBC Recent Labs  Lab 07/03/18 0902  WBC  16.1*  HGB 8.2*  HCT 30.0*  PLT 162   ------------------------------------------------------------------------------------------------------------------  Chemistries  Recent Labs  Lab 07/03/18 0902  NA 144  K 4.1  CL 105  CO2 32  GLUCOSE 97  BUN 36*  CREATININE 1.20*  CALCIUM 8.3*   ------------------------------------------------------------------------------------------------------------------  Cardiac Enzymes Recent Labs  Lab 07/01/18 1132  TROPONINI 0.03*   ------------------------------------------------------------------------------------------------------------------  RADIOLOGY:  No results found.  EKG:   Orders placed or performed during the hospital encounter of 07/01/18  . ED EKG  . ED EKG    ASSESSMENT AND PLAN:   Acute respiratory failure with hypoxia due to bilateral pneumonia with leukocytosis, I checked a procalcitonin that was low WBC count still high I will obtain a CT scan of the chest Continue antibiotics  Hyperkalemia -potassium 4.1 is post treatment with Lokelma ,    Lactic acidosis.  Follow-up lactic acid level, treatment as above.  Dehydration.    Status post treatment with IV fluids Anemia of chronic disease.  Stable.  Hypertension.  Continue Lopressor blood pressure stable  Atrial fibrillation continue metoprolol not a good anticoagulation candidate for fall risk continue aspirin  Depression continue Celexa Celexa  All the records are reviewed and case discussed with Care Management/Social Workerr. Management plans discussed with the patient, family and they are in agreement.  CODE STATUS: partial code   TOTAL TIME TAKING CARE OF THIS PATIENT: 35  minutes.   POSSIBLE D/C IN 2  DAYS, DEPENDING ON CLINICAL CONDITION.  Note: This dictation was prepared with Dragon dictation along with smaller phrase technology. Any transcriptional errors that result from this process are  unintentional.   Dustin Flock M.D on 07/03/2018 at  12:51 PM  Between 7am to 6pm - Pager - [phone] After 6pm go to [email or web address] - password [email or web address] [place] [facility]  Office  [phone]  CC: Primary care physician; Baxter Hire, MD

## 2018-07-03 NOTE — Plan of Care (Signed)
  Problem: Safety: Goal: Ability to remain free from injury will improve Outcome: Progressing   

## 2018-07-03 NOTE — Evaluation (Signed)
Clinical/Bedside Swallow Evaluation Patient Details  Name: Rita Vang MRN: [id number] Date of Birth: 02/04/1924  Today's Date: 07/03/2018 Time: SLP Start Time (ACUTE ONLY): 0920 SLP Stop Time (ACUTE ONLY): 1010 SLP Time Calculation (min) (ACUTE ONLY): 50 min  Past Medical History:  Past Medical History:  Diagnosis Date  . Anxiety   . Anxiety disorder    unspecified  . Cerebral infarction (HCC)   . CVA (cerebral vascular accident) (HCC)   . Dementia (HCC)   . Depression   . GERD (gastroesophageal reflux disease)   . GI bleed   . History of recurrent UTIs   . HLD (hyperlipidemia)   . HTN (hypertension)   . Vertigo    syncope   Past Surgical History:  Past Surgical History:  Procedure Laterality Date  . CARPAL TUNNEL RELEASE Right 1995   endoscopic   . CARPAL TUNNEL RELEASE Left 04/30/2013   endoscopic , Dr. Rosita KeaMenz   . CATARACT EXTRACTION Right   . CHOLECYSTECTOMY    . ELBOW SURGERY Left   . ERCP W/ SPHINCTEROTOMY AND BALLOON DILATION  2008   and endoscopy  . ESOPHAGOGASTRODUODENOSCOPY (EGD) WITH PROPOFOL N/A 05/01/2017   Procedure: ESOPHAGOGASTRODUODENOSCOPY (EGD) WITH PROPOFOL;  Surgeon: Pasty Spillersahiliani, Varnita B, MD;  Location: [facility] ENDOSCOPY;  Service: Endoscopy;  Laterality: N/A;  . ORIF DISTAL RADIUS FRACTURE Right 2002  . TOTAL KNEE ARTHROPLASTY Left 11/23/2007  . TRIGGER FINGER RELEASE  04/30/2013   third digit, incision tendon sheath for trigger finger   HPI:  Pt is a 85 y.o. female with a known history of CVA, Dementia, UTIs, GERD, Anxiety, depression, GI bleeding, hypertension and hyperlipidemia etc.  The patient presented the ED with above chief complaints.  She is confused, unable to provide any information.  According to Dr. Alphonzo LemmingsMcShane, the patient was recently hospitalized.  She is found hypoxia and put on oxygen by nasal cannula 4 L and urine nonrebreather.  Chest x-ray show bilateral pneumonia.  The patient is treated with cefepime and vancomycin and Solu-Medrol  in the ED.  COVID-19 test is negative.  Pt was admitted w/ Acute respiratory failure with hypoxia due to bilateral pneumonia with leukocytosis.  Pt is on Hospice servcies at her SNF.   Assessment / Plan / Recommendation Clinical Impression  Pt appears to present w/ Mild-Moderate oropharyngeal phase dysphagia w/ min increased risk for aspiration which is reduced when following aspiration precautions AND w/ monitoring/Supervision given during oral intake. Pt does present w/ baseline Dementia at an advanced age. Pt required full feeding assistance and monitoring w/ all po's as well. The baseline Cognitive decline and requiring assistance w/ feeding(w/ drowsiness) can increase risk for aspiration, dysphagia. Pt was positioned upright and given trials of ice chips, nectar liquids, and purees/minced foods in puree. Solids were not assessed d/t her presentation. Pt was not able to help hold the cup to drink when given cues and support. Pt consumed trials w/ no immediate, overt s/s of aspiration noted, no decline in vocal quality or respiratory status during/post trials. Pt's oral phase was c/b decreased awareness of the trials at times, oral holding, delayed A-P transfer time for swallowing/clearing, and increased mastication if minced, broken down foods. Pt required verbal cues to attend and assistance w/ all feeding tasks. No unilateral weakness noted during informal OM exam, or w/ bolus management. Recommend a dysphagia level 2 (minced foods, moistened) diet w/ NECTAR liquids; aspiration precautions; feeding support/monitoring at all meals. Pills CRUSHED in puree for easier, safer swallowing. Precautions posted. NSG updated.  ST services will be available if any new needs while admitted.  Of note, recommend f/u w/ further Education w/ family/pt at SNF on swallowing w/ Dementia at advanced age - pt is followed by Hospice services who can provide the education.  SLP Visit Diagnosis: Dysphagia, oropharyngeal phase  (R13.12)    Aspiration Risk  Mild aspiration risk;Risk for inadequate nutrition/hydration    Diet Recommendation  Dysphagia level 2 (MINCED foods) w/ NECTAR consistency liquids; aspiration precautions; supervision w/ all oral intake and assistance at meals  Medication Administration: Crushed with puree(for safest swallowing)    Other  Recommendations Recommended Consults: (Dietician f/u; on Hospice services) Oral Care Recommendations: Oral care BID;Oral care before and after PO;Staff/trained caregiver to provide oral care Other Recommendations: Order thickener from pharmacy;Prohibited food (jello, ice cream, thin soups);Remove water pitcher;Have oral suction available   Follow up Recommendations Skilled Nursing facility(TBD)      Frequency and Duration min 2x/week  1 week       Prognosis Prognosis for Safe Diet Advancement: Fair Barriers to Reach Goals: Cognitive deficits;Time post onset;Severity of deficits      Swallow Study   General Date of Onset: 07/01/18 HPI: Pt is a 85 y.o. female with a known history of CVA, Dementia, UTIs, GERD, Anxiety, depression, GI bleeding, hypertension and hyperlipidemia etc.  The patient presented the ED with above chief complaints.  She is confused, unable to provide any information.  According to Dr. Burlene Arnt, the patient was recently hospitalized.  She is found hypoxia and put on oxygen by nasal cannula 4 L and urine nonrebreather.  Chest x-ray show bilateral pneumonia.  The patient is treated with cefepime and vancomycin and Solu-Medrol in the ED.  COVID-19 test is negative.  Pt was admitted w/ Acute respiratory failure with hypoxia due to bilateral pneumonia with leukocytosis.  Pt is on Hospice servcies at her SNF. Type of Study: Bedside Swallow Evaluation Previous Swallow Assessment: 2019 x2; 2016 Diet Prior to this Study: Dysphagia 1 (puree);Nectar-thick liquids(post modification yesterday) Temperature Spikes Noted: No(wbc 16.1  declining) Respiratory Status: Nasal cannula(3 liters) History of Recent Intubation: No Behavior/Cognition: Cooperative;Pleasant mood;Confused;Distractible;Requires cueing Oral Cavity Assessment: Dry Oral Care Completed by SLP: Yes Oral Cavity - Dentition: Missing dentition(a partial plate) Vision: (n/a) Self-Feeding Abilities: Needs assist;Needs set up;Total assist Patient Positioning: Upright in bed(needed positioning) Baseline Vocal Quality: Normal(during few words) Volitional Cough: Cognitively unable to elicit Volitional Swallow: Unable to elicit    Oral/Motor/Sensory Function Overall Oral Motor/Sensory Function: Within functional limits(grossly WFL w/ no unilateral weakness noted)   Ice Chips Ice chips: Impaired Presentation: Spoon(fed; 2 trials) Oral Phase Impairments: Reduced lingual movement/coordination;Poor awareness of bolus Oral Phase Functional Implications: Oral holding;Prolonged oral transit Pharyngeal Phase Impairments: (none)   Thin Liquid Thin Liquid: Not tested Other Comments: d/t Cognitive decline    Nectar Thick Nectar Thick Liquid: Impaired Presentation: Cup;Spoon;Straw(3-4 trials via each method) Oral Phase Impairments: Reduced lingual movement/coordination;Poor awareness of bolus Oral phase functional implications: Prolonged oral transit Pharyngeal Phase Impairments: (none)   Honey Thick Honey Thick Liquid: Not tested   Puree Puree: Impaired Presentation: Spoon(fed; 10+ trials) Oral Phase Impairments: Poor awareness of bolus Oral Phase Functional Implications: Prolonged oral transit(min) Pharyngeal Phase Impairments: (none)   Solid     Solid: Impaired Presentation: Spoon(fed; 5 trials) Oral Phase Impairments: Impaired mastication;Reduced lingual movement/coordination;Poor awareness of bolus Oral Phase Functional Implications: Prolonged oral transit;Impaired mastication Pharyngeal Phase Impairments: (none)       Orinda Kenner, MS,  CCC-SLP Watson,Katherine 07/03/2018,4:34 PM

## 2018-07-03 NOTE — Consult Note (Signed)
Pharmacy Antibiotic Note  Rita Vang is a 85 y.o. female admitted on 07/01/2018 with pneumonia.  Pharmacy has been consulted for Cefepime dosing.  Plan: Vancomycin d/c'ed - due to MRSA PCR negative   Cefepime 2g q 24H  Height: 5\' 4"  (162.6 cm) Weight: 124 lb 9.6 oz (56.5 kg) IBW/kg (Calculated) : 54.7  Temp (24hrs), Avg:97.7 F (36.5 C), Min:97.5 F (36.4 C), Max:98 F (36.7 C)  Recent Labs  Lab 07/01/18 1132 07/01/18 1604 07/02/18 1132 07/03/18 0902  WBC 16.8*  --  17.0* 16.1*  CREATININE 1.20*  --  1.35* 1.20*  LATICACIDVEN 2.3* 1.7  --   --     Estimated Creatinine Clearance: 26.4 mL/min (A) (by C-G formula based on SCr of 1.2 mg/dL (H)).    Allergies  Allergen Reactions  . Sulfamethoxazole-Trimethoprim Other (See Comments)    Chest pain  . Oxycontin [Oxycodone Hcl] Other (See Comments)    Hallucinations   . Clarithromycin Other (See Comments)    Abdominal pain     Antimicrobials this admission: Vancomycin 6/17 >>  Cefepime 6/17 >>   Dose adjustments this admission: None  Microbiology results: 6/17 BCx: pending  Recent MRSA PCR + (01/26/18)  COVID NEG  Thank you for allowing pharmacy to be a part of this patient's care.  Oswald Hillock, PharmD, BCPS Clinical Pharmacist 07/03/2018 10:01 AM

## 2018-07-04 ENCOUNTER — Other Ambulatory Visit: Payer: Self-pay

## 2018-07-04 NOTE — Progress Notes (Signed)
Rita Vang at [place] NAME: Rita Vang    MR#:  [id number]  DATE OF BIRTH:  09-29-1924  SUBJECTIVE:  patient has confusion and at baseline dementia. Denies any complaints. Repeat the words I say. REVIEW OF SYSTEMS:  ROS limited  RESPIRATORY: improving cough, shortness of breath, no wheezing or hemoptysis.  CARDIOVASCULAR: No chest pain, orthopnea, edema.  GASTROINTESTINAL: No nausea, vomiting, diarrhea or abdominal pain.  SKIN: No rash or lesion. MUSCULOSKELETAL: No joint pain or arthritis.   NEUROLOGIC: No tingling, numbness, weakness.    DRUG ALLERGIES:   Allergies  Allergen Reactions  . Sulfamethoxazole-Trimethoprim Other (See Comments)    Chest pain  . Oxycontin [Oxycodone Hcl] Other (See Comments)    Hallucinations   . Clarithromycin Other (See Comments)    Abdominal pain     VITALS:  Blood pressure 129/74, pulse 80, temperature 97.9 F (36.6 C), resp. rate 19, height 5\' 4"  (1.626 m), weight 56.5 kg, SpO2 94 %.  PHYSICAL EXAMINATION:  GENERAL:  85 y.o.-year-old patient lying in the bed with no acute distress. fraile EYES: Pupils equal, round, reactive to light and accommodation. No scleral icterus. Extraocular muscles intact.  HEENT: Head atraumatic, normocephalic. Oropharynx and nasopharynx clear.  NECK:  Supple, no jugular venous distention. No thyroid enlargement, no tenderness.  LUNGS: Normal breath sounds bilaterally, no wheezing, rales,rhonchi or crepitation. No use of accessory muscles of respiration.  CARDIOVASCULAR: S1, S2 normal. No murmurs, rubs, or gallops.  ABDOMEN: Soft, nontender, nondistended. Bowel sounds present.   EXTREMITIES: No pedal edema, cyanosis, or clubbing.  NEUROLOGIC: awake and alert  Sensation intact. Gait not checked for safety  PSYCHIATRIC: The patient is alert but disoriented at baseline SKIN: No obvious rash, lesion, or ulcer.    LABORATORY PANEL:   CBC Recent Labs  Lab  07/03/18 0902  WBC 16.1*  HGB 8.2*  HCT 30.0*  PLT 162   ------------------------------------------------------------------------------------------------------------------  Chemistries  Recent Labs  Lab 07/03/18 0902  NA 144  K 4.1  CL 105  CO2 32  GLUCOSE 97  BUN 36*  CREATININE 1.20*  CALCIUM 8.3*   ------------------------------------------------------------------------------------------------------------------  Cardiac Enzymes Recent Labs  Lab 07/01/18 1132  TROPONINI 0.03*   ------------------------------------------------------------------------------------------------------------------  RADIOLOGY:  Ct Chest Wo Contrast  Result Date: 07/03/2018 CLINICAL DATA:  Acute respiratory failure hypoxemia. EXAM: CT CHEST WITHOUT CONTRAST TECHNIQUE: Multidetector CT imaging of the chest was performed following the standard protocol without IV contrast. COMPARISON:  Chest radiograph 07/01/2018 FINDINGS: Cardiovascular: Enlarged heart. Calcific atherosclerotic disease of the coronary arteries and aorta. Fusiform dilation of the ascending aorta measuring 4.5 cm in cross-section. Mediastinum/Nodes: Shotty nonspecific mediastinal lymphadenopathy. Lungs/Pleura: Small amount of mucus within the trachea and right mainstem bronchus. Interstitial pulmonary edema. Patchy peripheral areas of airspace consolidation in the upper lobes and lingula. Areas of airspace consolidation versus atelectasis in the bilateral lung bases. Bilateral small pleural effusions. Upper Abdomen: Small volume perihepatic ascites. Prominent size of the spleen. Musculoskeletal: Healing or healed left-sided rib fractures. Chronic appearing severe compression fractures of T5, T11 and T12, not significantly changed compared to chest radiograph dated 05/04/2017, accounting for differences in imaging techniques. IMPRESSION: 1. Enlarged heart with interstitial pulmonary edema. 2. Small bilateral pleural effusions. 3. Patchy  peripheral areas of airspace consolidation in the upper lobes and lingula. This may represent multifocal pneumonia. 4. Additional areas of airspace consolidation versus atelectasis in bilateral lung bases. 5. Small amount of mucus within the trachea and right mainstem bronchus. 6. Shotty  mediastinal lymphadenopathy. 7. Fusiform dilation of the ascending aorta measuring 4.5 cm in cross-section. Ascending thoracic aortic aneurysm. Recommend semi-annual imaging followup by CTA or MRA and referral to cardiothoracic surgery if not already obtained. This recommendation follows 2010 ACCF/AHA/AATS/ACR/ASA/SCA/SCAI/SIR/STS/SVM Guidelines for the Diagnosis and Management of Patients With Thoracic Aortic Disease. 2010; 121: [id number]. 8. Enlarged spleen. 9. Small volume perihepatic ascites. Aortic Atherosclerosis (ICD10-I70.0). Electronically Signed   By: Ted Mcalpineobrinka  Dimitrova M.D.   On: 07/03/2018 16:20    EKG:   Orders placed or performed during the hospital encounter of 07/01/18  . ED EKG  . ED EKG    ASSESSMENT AND PLAN:  Rita Vang  is a 85 y.o. female with a known history of CVA, dementia, GERD, depression, GI bleeding, hypertension and hyperlipidemia etc.  The patient presented the ED with..  *Acute respiratory failure with hypoxia due to bilateral pneumonia with chronic leukocytosis - checked a procalcitonin that was low -CT chest shows Patchy peripheral areas of airspace consolidation in the upper lobes and lingula. This may represent multifocal pneumonia -Continue antibiotics--change to oral on monday  *Hyperkalemia -potassium 5.7-- now 4.1 is post treatment with Lokelma    *Lactic acidosis.  Follow-up lactic acid level resolved  *Dehydration.    Status post treatment with IV fluids  *Anemia of chronic disease.  Stable.  *Hypertension.  Continue Lopressor blood pressure stable  *Atrial fibrillation continue metoprolol  -not a good anticoagulation candidate for fall risk and GI bleed in  the past - continue aspirin  *Depression continue Celexa   Spoke with patient's son [name] and updated. Patient will discharged back to her memory care assisted living place on Monday if she continues to show improvement.  Physical therapy to see patient.  CODE STATUS: partial code   TOTAL TIME TAKING CARE OF THIS PATIENT: 35  minutes.   POSSIBLE D/C IN 1-2  DAYS, DEPENDING ON CLINICAL CONDITION.  Note: This dictation was prepared with Dragon dictation along with smaller phrase technology. Any transcriptional errors that result from this process are unintentional.   Enedina FinnerSona Xsavier Seeley M.D on 07/04/2018 at 1:56 PM  Between 7am to 6pm - Pager - [phone] After 6pm go to [email or web address] - password [email or web address] [facility]  [facility]  Office  [phone]  CC: Primary care physician; Gracelyn NurseJohnston, John D, MD

## 2018-07-04 NOTE — Progress Notes (Signed)
Spoke to her son, Keyshla Tunison, about her care and how she is doing.  Showing some improvement.  He said he made up up later.

## 2018-07-05 LAB — LEGIONELLA PNEUMOPHILA SEROGP 1 UR AG: L. pneumophila Serogp 1 Ur Ag: NEGATIVE

## 2018-07-05 NOTE — Progress Notes (Signed)
Patient ID: Rita Vang, female   DOB: Sep 19, 1924, 85 y.o.   MRN: [id number] discussed with patient's son Karrine Kluttz on the phone he is the power of attorney he tells me he has filled out the most form at the facility I don't have a copy of it in the chart  [name] partial code son does not want cardioversion or CPR. He wants remaining mechanical ventilation/BiPAP/A CLS meds.

## 2018-07-05 NOTE — Evaluation (Signed)
Physical Therapy Evaluation Patient Details Name: Rita Vang MRN: [id number] DOB: 12-10-1924 Today's Date: 07/05/2018   History of Present Illness  Pt is a 85 y.o. female presenting to hospital from [facility] (followed by hospice) with increasing SOB.  Pt admitted with acute respiratory failure with hypoxia d/t B PNA with leukocytosis, HAP, and lactic acidosis.  PMH includes htn, HLD, CVA, and dementia.  Clinical Impression  Prior to hospital admission, per chart review pt required staff assist with bathing, toileting, and transfers (prior PT note about 5 months ago report pt was able to transfer to w/c with 1 assist).  Per chart review pt lives at memory care ALF and followed by hospice.  Currently pt is max assist to attempt to sit pt onto edge of bed (pt initially agreeable but then pt started to resist upon coming 3/4th way into sitting so pt assisted back to bed).  Pt demonstrating confusion and inconsistent with answers (also limited answers noted).  Pt would benefit from skilled PT to address noted impairments and functional limitations (see below for any additional details) during hospital stay.  Upon hospital discharge, anticipate no further PT follow-up needs.    Follow Up Recommendations No PT follow up;Supervision/Assistance - 24 hour    Equipment Recommendations  Wheelchair (measurements PT);Wheelchair cushion (measurements PT)    Recommendations for Other Services       Precautions / Restrictions Precautions Precautions: Fall Precaution Comments: Aspiration Restrictions Weight Bearing Restrictions: No      Mobility  Bed Mobility Overal bed mobility: Needs Assistance Bed Mobility: Supine to Sit     Supine to sit: Max assist;HOB elevated     General bed mobility comments: assist for trunk and B LE's; pt initially assisting a little but upon coming about 3/4th of the way into sitting position pt suddenly resisting and trying to lay down so pt assisted back to  laying in bed  Transfers                 General transfer comment: Deferred d/t above  Ambulation/Gait             General Gait Details: Deferred (per chart pt w/c level baseline)  Stairs            Wheelchair Mobility    Modified Rankin (Stroke Patients Only)       Balance                                             Pertinent Vitals/Pain Pain Assessment: Faces Faces Pain Scale: No hurt Pain Intervention(s): Limited activity within patient's tolerance;Monitored during session;Repositioned    Home Living Family/patient expects to be discharged to:: Skilled nursing facility                 Additional Comments: Per chart pt is from Memory care at [facility] and is followed by hospice.    Prior Function Level of Independence: Needs assistance   Gait / Transfers Assistance Needed: Per previous notes about 5 months ago pt was able to stand and transfer to w/c with 1 person assist and helps use legs to mobilize in w/c.           Hand Dominance        Extremity/Trunk Assessment   Upper Extremity Assessment Upper Extremity Assessment: Difficult to assess due to impaired cognition  Lower Extremity Assessment Lower Extremity Assessment: Difficult to assess due to impaired cognition(B knee extension close to neutral PROM; B knee flexion at least 90 degrees PROM; B ankle DF to neutral)    Cervical / Trunk Assessment Cervical / Trunk Assessment: Normal  Communication   Communication: HOH  Cognition Arousal/Alertness: (Pt sleeping upon PT entry but woken with vc's.) Behavior During Therapy: WFL for tasks assessed/performed Overall Cognitive Status: No family/caregiver present to determine baseline cognitive functioning                                 General Comments: Pt did not answer any of the A&O questions but did respond to therapist otherwise (although limited with her answers).      General  Comments   Pt agreeable to sitting onto edge of bed initially.    Exercises     Assessment/Plan    PT Assessment Patient needs continued PT services  PT Problem List Decreased strength;Decreased balance;Decreased activity tolerance;Decreased mobility       PT Treatment Interventions Functional mobility training;Therapeutic activities;Therapeutic exercise;Balance training    PT Goals (Current goals can be found in the Care Plan section)  Acute Rehab PT Goals Patient Stated Goal: pt did not state any goal PT Goal Formulation: Patient unable to participate in goal setting Time For Goal Achievement: 07/19/18 Potential to Achieve Goals: Fair    Frequency Min 2X/week   Barriers to discharge        Co-evaluation               AM-PAC PT "6 Clicks" Mobility  Outcome Measure Help needed turning from your back to your side while in a flat bed without using bedrails?: A Lot Help needed moving from lying on your back to sitting on the side of a flat bed without using bedrails?: A Lot Help needed moving to and from a bed to a chair (including a wheelchair)?: A Lot Help needed standing up from a chair using your arms (e.g., wheelchair or bedside chair)?: A Lot Help needed to walk in hospital room?: Total Help needed climbing 3-5 steps with a railing? : Total 6 Click Score: 10    End of Session Equipment Utilized During Treatment: Oxygen(2 L via nasal cannula) Activity Tolerance: Patient tolerated treatment well Patient left: in bed;with call bell/phone within reach;with bed alarm set Nurse Communication: Mobility status;Precautions PT Visit Diagnosis: Other abnormalities of gait and mobility (R26.89);Muscle weakness (generalized) (M62.81)    Time: 0945-1000 PT Time Calculation (min) (ACUTE ONLY): 15 min   Charges:   PT Evaluation $PT Eval Low Complexity: 1 Low         Shekina Cordell, PT 07/05/18, 1:00 PM [phone]

## 2018-07-05 NOTE — Progress Notes (Signed)
Patient son in room at bedside, provided with update of todays progress. [name] on oxygen chronically, and has been weaned to 2 liters from her PTA of 4 L

## 2018-07-05 NOTE — Progress Notes (Signed)
[facility] [place] [facility]   PATIENT NAME: Rita BlueFrances Vang    MR#:  [id number]  DATE OF BIRTH:  07/17/1924  SUBJECTIVE:  patient has confusion and at baseline dementia. Denies any complaints. Repeat the words I say. REVIEW OF SYSTEMS:  ROS limited  RESPIRATORY: improving cough, shortness of breath, no wheezing or hemoptysis.  CARDIOVASCULAR: No chest pain, orthopnea, edema.  GASTROINTESTINAL: No nausea, vomiting, diarrhea or abdominal pain.  SKIN: No rash or lesion. MUSCULOSKELETAL: No joint pain or arthritis.   NEUROLOGIC: No tingling, numbness, weakness.    DRUG ALLERGIES:   Allergies  Allergen Reactions  . Sulfamethoxazole-Trimethoprim Other (See Comments)    Chest pain  . Oxycontin [Oxycodone Hcl] Other (See Comments)    Hallucinations   . Clarithromycin Other (See Comments)    Abdominal pain     VITALS:  Blood pressure 132/60, pulse 76, temperature 99 F (37.2 C), temperature source Oral, resp. rate (!) 22, height 5\' 4"  (1.626 m), weight 56.5 kg, SpO2 98 %.  PHYSICAL EXAMINATION:  GENERAL:  85 y.o.-year-old patient lying in the bed with no acute distress. fraile EYES: Pupils equal, round, reactive to light and accommodation. No scleral icterus. Extraocular muscles intact.  HEENT: Head atraumatic, normocephalic. Oropharynx and nasopharynx clear.  NECK:  Supple, no jugular venous distention. No thyroid enlargement, no tenderness.  LUNGS: Normal breath sounds bilaterally, no wheezing, rales,rhonchi or crepitation. No use of accessory muscles of respiration.  CARDIOVASCULAR: S1, S2 normal. No murmurs, rubs, or gallops.  ABDOMEN: Soft, nontender, nondistended. Bowel sounds present.   EXTREMITIES: No pedal edema, cyanosis, or clubbing.  NEUROLOGIC: awake and alert  Sensation intact. Gait not checked for safety  PSYCHIATRIC: The patient is alert but disoriented at baseline SKIN: No obvious rash, lesion, or ulcer.    LABORATORY PANEL:    CBC Recent Labs  Lab 07/03/18 0902  WBC 16.1*  HGB 8.2*  HCT 30.0*  PLT 162   ------------------------------------------------------------------------------------------------------------------  Chemistries  Recent Labs  Lab 07/03/18 0902  NA 144  K 4.1  CL 105  CO2 32  GLUCOSE 97  BUN 36*  CREATININE 1.20*  CALCIUM 8.3*   ------------------------------------------------------------------------------------------------------------------  Cardiac Enzymes Recent Labs  Lab 07/01/18 1132  TROPONINI 0.03*   ------------------------------------------------------------------------------------------------------------------  RADIOLOGY:  Ct Chest Wo Contrast  Result Date: 07/03/2018 CLINICAL DATA:  Acute respiratory failure hypoxemia. EXAM: CT CHEST WITHOUT CONTRAST TECHNIQUE: Multidetector CT imaging of the chest was performed following the standard protocol without IV contrast. COMPARISON:  Chest radiograph 07/01/2018 FINDINGS: Cardiovascular: Enlarged heart. Calcific atherosclerotic disease of the coronary arteries and aorta. Fusiform dilation of the ascending aorta measuring 4.5 cm in cross-section. Mediastinum/Nodes: Shotty nonspecific mediastinal lymphadenopathy. Lungs/Pleura: Small amount of mucus within the trachea and right mainstem bronchus. Interstitial pulmonary edema. Patchy peripheral areas of airspace consolidation in the upper lobes and lingula. Areas of airspace consolidation versus atelectasis in the bilateral lung bases. Bilateral small pleural effusions. Upper Abdomen: Small volume perihepatic ascites. Prominent size of the spleen. Musculoskeletal: Healing or healed left-sided rib fractures. Chronic appearing severe compression fractures of T5, T11 and T12, not significantly changed compared to chest radiograph dated 05/04/2017, accounting for differences in imaging techniques. IMPRESSION: 1. Enlarged heart with interstitial pulmonary edema. 2. Small bilateral pleural  effusions. 3. Patchy peripheral areas of airspace consolidation in the upper lobes and lingula. This may represent multifocal pneumonia. 4. Additional areas of airspace consolidation versus atelectasis in bilateral lung bases. 5. Small amount of mucus within the trachea and right  mainstem bronchus. 6. Shotty mediastinal lymphadenopathy. 7. Fusiform dilation of the ascending aorta measuring 4.5 cm in cross-section. Ascending thoracic aortic aneurysm. Recommend semi-annual imaging followup by CTA or MRA and referral to cardiothoracic surgery if not already obtained. This recommendation follows 2010 ACCF/AHA/AATS/ACR/ASA/SCA/SCAI/SIR/STS/SVM Guidelines for the Diagnosis and Management of Patients With Thoracic Aortic Disease. 2010; 121: F818-E993. 8. Enlarged spleen. 9. Small volume perihepatic ascites. Aortic Atherosclerosis (ICD10-I70.0). Electronically Signed   By: Fidela Salisbury M.D.   On: 07/03/2018 16:20    EKG:   Orders placed or performed during the hospital encounter of 07/01/18  . ED EKG  . ED EKG    ASSESSMENT AND PLAN:  Rita Vang  is a 85 y.o. female with a known history of CVA, dementia, GERD, depression, GI bleeding, hypertension and hyperlipidemia etc.  The patient presented the ED with..  *Acute respiratory failure with hypoxia due to bilateral pneumonia with chronic leukocytosis - checked a procalcitonin that was low -pt is at a HIG risk for Aspiration--son Scott aware -CT chest shows Patchy peripheral areas of airspace consolidation in the upper lobes and lingula. This may represent multifocal pneumonia -Continue antibiotics--change to oral on monday -sats 98% on RA  *Hyperkalemia -potassium 5.7-- now 4.1 is post treatment with Lokelma    *Lactic acidosis.  Follow-up lactic acid level resolved  *Dehydration.    Status post treatment with IV fluids  *Anemia of chronic disease.  Stable.  *Hypertension.  Continue Lopressor blood pressure stable  *Atrial  fibrillation continue metoprolol  -not a good anticoagulation candidate for fall risk and GI bleed in the past - continue aspirin  *Depression continue Celexa   Spoke with patient's son Nicki Reaper and updated. Patient will discharged back to her memory care assisted living place on Monday if she continues to show improvement.  Pt is followed by hospice  CODE STATUS: partial code   TOTAL TIME TAKING CARE OF THIS PATIENT: 25  minutes.   POSSIBLE D/C IN 1-2  DAYS, DEPENDING ON CLINICAL CONDITION.  Note: This dictation was prepared with Dragon dictation along with smaller phrase technology. Any transcriptional errors that result from this process are unintentional.   Fritzi Mandes M.D on 07/05/2018 at 1:34 PM  Between 7am to 6pm - Pager - [phone] After 6pm go to [email or web address] - password [email or web address] [facility]  Office  [phone]  CC: Primary care physician; Baxter Hire, MD

## 2018-07-06 LAB — CULTURE, BLOOD (ROUTINE X 2)
Culture: NO GROWTH
Culture: NO GROWTH
Special Requests: ADEQUATE

## 2018-07-06 MED ORDER — TRAMADOL HCL 50 MG PO TABS: 50.0000 mg | ORAL_TABLET | Freq: Two times a day (BID) | ORAL | 0 refills | Status: AC | PRN

## 2018-07-06 NOTE — TOC Transition Note (Addendum)
Transition of Care [facility]) - CM/SW Discharge Note   Patient Details  Name: MARYNELL BIES MRN: [id number] Date of Birth: [birth year]  [facility]) CM/SW Contact:  Elza Rafter, RN Phone Number: 07/06/2018, 11:57 AM   Clinical Narrative:  Patient discharging to [facility] today.  Son Nicki Reaper is aware.  [name] a partial code however she did not come with a MOST form.  Son agrees to DNR for transport to [facility].  Packet placed on chart with DNR signed.  Esther Hardy with [facility] is aware of discharge.    Final next level of care: Skilled Nursing Facility Barriers to Discharge: No Barriers Identified   Patient Goals and CMS Choice        Discharge Placement                  Name of family member notified: Sherryll Burger    Discharge Plan and Services                                     Social Determinants of Health (SDOH) Interventions     Readmission Risk Interventions No flowsheet data found.

## 2018-07-06 NOTE — NC FL2 (Signed)
[place] MEDICAID FL2 LEVEL OF CARE SCREENING TOOL     IDENTIFICATION  Patient Name: Rita Vang Birthdate: 09/07/1924 Sex: female Admission Date (Current Location): 07/01/2018  [place]   Facility and Address:  [facility], [address], [place], [place] [address]      Provider Number: [id number]  Attending Physician Name and Address:  Enedina FinnerPatel, Sona, MD  Relative Name and Phone Number:  Denherder,Scott Son [phone]    Current Level of Care: Hospital Recommended Level of Care: Assisted Living Facility Prior Approval Number:    Date Approved/Denied:   PASRR Number:    Discharge Plan: Domiciliary (Rest home)    Current Diagnoses: Patient Active Problem List   Diagnosis Date Noted  . Pneumonia 07/01/2018  . Unstageable pressure ulcer of sacral region (HCC) 01/29/2018  . TIA (transient ischemic attack) 06/17/2017  . Palliative care by specialist   . DNR (do not resuscitate) discussion   . Lewy body dementia without behavioral disturbance (HCC)   . Upper GI bleed 05/01/2017  . Acute gastrointestinal hemorrhage   . Gastric irritation   . Herpes genitalis 04/04/2017  . Protein-calorie malnutrition (HCC) 04/04/2017  . Hypomagnesemia 04/04/2017  . Vitamin B12 deficiency 04/04/2017  . Generalized weakness 04/01/2017  . Pressure injury of skin 03/07/2017  . Sepsis (HCC) 03/06/2017  . CAP (community acquired pneumonia) 03/06/2017  . HTN (hypertension) 03/06/2017  . Anxiety 03/06/2017  . HLD (hyperlipidemia) 03/06/2017  . GERD (gastroesophageal reflux disease) 03/06/2017  . CKD (chronic kidney disease), stage III (HCC) 03/06/2017  . CVA (cerebral infarction) 08/23/2014    Orientation RESPIRATION BLADDER Height & Weight     Self  O2, Other (Comment)(3L; nebulizer machine) Incontinent Weight: 56.5 kg Height:  5\' 4"  (162.6 cm)  BEHAVIORAL SYMPTOMS/MOOD NEUROLOGICAL BOWEL NUTRITION STATUS      Incontinent  Diet(chopped)  AMBULATORY STATUS COMMUNICATION OF NEEDS Skin   Extensive Assist Verbally Normal                       Personal Care Assistance Level of Assistance  Dressing, Bathing Bathing Assistance: Maximum assistance   Dressing Assistance: Maximum assistance     Functional Limitations Info  Hearing, Sight Sight Info: Impaired Hearing Info: Impaired      SPECIAL CARE FACTORS FREQUENCY                       Contractures Contractures Info: Not present    Additional Factors Info  Code Status, Allergies Code Status Info: DNR 02/26/18 by Dr. Joen LauraLaura Bliss Allergies Info: clarithromycin, sulfa, oxycontin              Discharge Medications: Please see discharge summary for a list of discharge medications.   STOP taking these medications   furosemide 40 MG tablet Commonly known as: LASIX   levofloxacin 500 MG tablet Commonly known as: LEVAQUIN     TAKE these medications   acetaminophen 500 MG tablet Commonly known as: TYLENOL Take 500 mg by mouth every 6 (six) hours as needed for mild pain or fever.   albuterol 108 (90 Base) MCG/ACT inhaler Commonly known as: VENTOLIN HFA Inhale 2 puffs into the lungs every 6 (six) hours as needed for wheezing or shortness of breath.   aspirin EC 81 MG tablet Take 81 mg by mouth daily.   Atrovent HFA 17 MCG/ACT inhaler Generic drug: ipratropium Inhale 2 puffs into the lungs 3 (three) times daily as needed  for wheezing.   citalopram 20 MG tablet Commonly known as: CELEXA Take 1 tablet by mouth daily.   donepezil 10 MG tablet Commonly known as: ARICEPT Take 1 tablet by mouth at bedtime.   feeding supplement (PRO-STAT SUGAR FREE 64) Liqd Take 30 mLs by mouth daily.   fluticasone 50 MCG/ACT nasal spray Commonly known as: FLONASE Place 1 spray into both nostrils daily.   galantamine 4 MG tablet Commonly known as: RAZADYNE Take 4 mg by mouth 2 (two) times daily with a meal.   guaiFENesin 100  MG/5ML liquid Commonly known as: ROBITUSSIN Take 200 mg by mouth every 6 (six) hours as needed for cough.   hydroxypropyl methylcellulose / hypromellose 2.5 % ophthalmic solution Commonly known as: ISOPTO TEARS / GONIOVISC Place 1 drop into both eyes 4 (four) times daily as needed for dry eyes.   hyoscyamine 0.125 MG/5ML Elix Commonly known as: LEVSIN Take 0.125 mg by mouth every 2 (two) hours as needed (increased secretions).   loperamide 2 MG capsule Commonly known as: IMODIUM Take 2 mg by mouth as needed for diarrhea or loose stools.   LORazepam 0.5 MG tablet Commonly known as: ATIVAN Take 0.5 mg by mouth every 4 (four) hours as needed for anxiety.   magnesium hydroxide 400 MG/5ML suspension Commonly known as: MILK OF MAGNESIA Take 30 mLs by mouth at bedtime as needed for mild constipation.   magnesium oxide 400 MG tablet Commonly known as: MAG-OX Take 400 mg by mouth daily.   metoprolol tartrate 25 MG tablet Commonly known as: LOPRESSOR Take 1 tablet (25 mg total) by mouth 2 (two) times daily.   Mi-Acid 200-200-20 MG/5ML suspension Generic drug: alum & mag hydroxide-simeth Take 30 mLs by mouth every 6 (six) hours as needed for indigestion or heartburn.   multivitamin with minerals Tabs tablet Take 1 tablet by mouth daily.   pantoprazole 40 MG tablet Commonly known as: PROTONIX Take 1 tablet (40 mg total) by mouth daily for 30 days.   QUEtiapine 50 MG tablet Commonly known as: SEROQUEL Take 50 mg by mouth at bedtime.   sennosides-docusate sodium 8.6-50 MG tablet Commonly known as: SENOKOT-S Take 1 tablet by mouth 2 (two) times daily.   simvastatin 40 MG tablet Commonly known as: ZOCOR Take 40 mg by mouth every evening.   traMADol 50 MG tablet Commonly known as: ULTRAM Take 1 tablet (50 mg total) by mouth every 12 (twelve) hours as needed for moderate pain.   valACYclovir 500 MG tablet Commonly known as: VALTREX Take 500 mg by mouth 2  (two) times daily.   vitamin C 250 MG tablet Commonly known as: ASCORBIC ACID Take 250 mg by mouth 2 (two) times daily.   zinc sulfate 220 (50 Zn) MG capsule Take 220 mg by mouth daily.     Relevant Imaging Results:  Relevant Lab Results:   Additional Information    Elza Rafter, RN

## 2018-07-06 NOTE — Discharge Instructions (Signed)
Dysphagia level 2 (MINCED foods) w/ NECTAR consistency liquids; aspiration precautions; supervision w/ all oral intake and assistance at meals

## 2018-07-06 NOTE — NC FL2 (Signed)
[place] LEVEL OF CARE SCREENING TOOL     IDENTIFICATION  Patient Name: Rita Vang Birthdate: 1924-07-19 Sex: female Admission Date (Current Location): 07/01/2018  [place] and [place] Number:  Engineering geologist and Address:  [facility], [address], [place], [place] [address]      Provider Number: [id number]  Attending Physician Name and Address:  Fritzi Mandes, MD  Relative Name and Phone Number:  [place] Son [phone]    Current Level of Care: Hospital Recommended Level of Care: [place] Prior Approval Number:    Date Approved/Denied:   PASRR Number:    Discharge Plan: Domiciliary (Rest home)    Current Diagnoses: Patient Active Problem List   Diagnosis Date Noted  . Pneumonia 07/01/2018  . Unstageable pressure ulcer of sacral region ([place]) 01/29/2018  . TIA (transient ischemic attack) 06/17/2017  . Palliative care by specialist   . DNR (do not resuscitate) discussion   . Lewy body dementia without behavioral disturbance ([place])   . Upper GI bleed 05/01/2017  . Acute gastrointestinal hemorrhage   . Gastric irritation   . Herpes genitalis 04/04/2017  . Protein-calorie malnutrition ([place]) 04/04/2017  . Hypomagnesemia 04/04/2017  . Vitamin B12 deficiency 04/04/2017  . Generalized weakness 04/01/2017  . Pressure injury of skin 03/07/2017  . Sepsis ([place]) 03/06/2017  . CAP (community acquired pneumonia) 03/06/2017  . HTN (hypertension) 03/06/2017  . Anxiety 03/06/2017  . HLD (hyperlipidemia) 03/06/2017  . GERD (gastroesophageal reflux disease) 03/06/2017  . CKD (chronic kidney disease), stage III ([place]) 03/06/2017  . CVA (cerebral infarction) 08/23/2014    Orientation RESPIRATION BLADDER Height & Weight     Self  O2, Other (Comment)(3L; nebulizer machine) Incontinent Weight: 56.5 kg Height:  5\' 4"  (162.6 cm)  BEHAVIORAL SYMPTOMS/MOOD NEUROLOGICAL BOWEL NUTRITION STATUS      Incontinent  Diet(chopped)  AMBULATORY STATUS COMMUNICATION OF NEEDS Skin   Extensive Assist Verbally Normal                       Personal Care Assistance Level of Assistance  Dressing, Bathing Bathing Assistance: Maximum assistance   Dressing Assistance: Maximum assistance     Functional Limitations Info  Hearing, Sight Sight Info: Impaired Hearing Info: Impaired      SPECIAL CARE FACTORS FREQUENCY                       Contractures Contractures Info: Not present    Additional Factors Info  Code Status, Allergies Code Status Info: DNR 02/26/18 by Dr. Lavera Guise Allergies Info: clarithromycin, sulfa, oxycontin           Current Medications (07/06/2018):  This is the current hospital active medication list Current Facility-Administered Medications  Medication Dose Route Frequency Provider Last Rate Last Dose  . 0.9 %  sodium chloride infusion   Intravenous PRN Nicholes Mango, MD   Stopped at 07/04/18 2144  . acetaminophen (TYLENOL) tablet 650 mg  650 mg Oral Q6H PRN Demetrios Loll, MD   650 mg at 07/01/18 2038   Or  . acetaminophen (TYLENOL) suppository 650 mg  650 mg Rectal Q6H PRN Demetrios Loll, MD      . albuterol (PROVENTIL) (2.5 MG/3ML) 0.083% nebulizer solution 2.5 mg  2.5 mg Nebulization Q2H PRN Demetrios Loll, MD      . alum & mag hydroxide-simeth (MAALOX/MYLANTA) 200-200-20 MG/5ML suspension 30 mL  30 mL Oral Q6H PRN Demetrios Loll, MD      .  aspirin EC tablet 81 mg  81 mg Oral Daily Shaune Pollackhen, Qing, MD   81 mg at 07/06/18 0912  . bisacodyl (DULCOLAX) EC tablet 5 mg  5 mg Oral Daily PRN Shaune Pollackhen, Qing, MD      . citalopram (CELEXA) tablet 20 mg  20 mg Oral Daily Shaune Pollackhen, Qing, MD   20 mg at 07/06/18 0911  . donepezil (ARICEPT) tablet 10 mg  10 mg Oral QHS Shaune Pollackhen, Qing, MD   10 mg at 07/05/18 2129  . feeding supplement (PRO-STAT SUGAR FREE 64) liquid 30 mL  30 mL Oral Daily Shaune Pollackhen, Qing, MD   30 mL at 07/06/18 0915  . fluticasone (FLONASE) 50 MCG/ACT nasal spray 1 spray  1 spray Each Nare Daily  Shaune Pollackhen, Qing, MD   1 spray at 07/06/18 0912  . galantamine (RAZADYNE) tablet 4 mg  4 mg Oral BID WC Shaune Pollackhen, Qing, MD   4 mg at 07/06/18 0912  . guaiFENesin (ROBITUSSIN) 100 MG/5ML solution 100 mg  5 mL Oral Q4H PRN Shaune Pollackhen, Qing, MD      . heparin injection 5,000 Units  5,000 Units Subcutaneous Q8H Shaune Pollackhen, Qing, MD   5,000 Units at 07/06/18 0602  . hyoscyamine (ANASPAZ) disintergrating tablet 0.125 mg  0.125 mg Oral Q2H PRN Shaune Pollackhen, Qing, MD   0.125 mg at 07/05/18 1009  . loperamide (IMODIUM) capsule 2 mg  2 mg Oral PRN Shaune Pollackhen, Qing, MD      . LORazepam (ATIVAN) tablet 0.5 mg  0.5 mg Oral Q4H PRN Shaune Pollackhen, Qing, MD      . magnesium hydroxide (MILK OF MAGNESIA) suspension 30 mL  30 mL Oral QHS PRN Shaune Pollackhen, Qing, MD      . magnesium oxide (MAG-OX) tablet 400 mg  400 mg Oral Daily Shaune Pollackhen, Qing, MD   400 mg at 07/06/18 0912  . metoprolol tartrate (LOPRESSOR) tablet 25 mg  25 mg Oral BID Shaune Pollackhen, Qing, MD   25 mg at 07/06/18 0911  . multivitamin with minerals tablet 1 tablet  1 tablet Oral Daily Shaune Pollackhen, Qing, MD   1 tablet at 07/06/18 0911  . ondansetron (ZOFRAN) tablet 4 mg  4 mg Oral Q6H PRN Shaune Pollackhen, Qing, MD       Or  . ondansetron [facility](ZOFRAN) injection 4 mg  4 mg Intravenous Q6H PRN Shaune Pollackhen, Qing, MD      . pantoprazole (PROTONIX) EC tablet 40 mg  40 mg Oral Daily Shaune Pollackhen, Qing, MD   40 mg at 07/06/18 0911  . QUEtiapine (SEROQUEL) tablet 50 mg  50 mg Oral QHS Shaune Pollackhen, Qing, MD   50 mg at 07/05/18 2127  . senna-docusate (Senokot-S) tablet 1 tablet  1 tablet Oral QHS PRN Shaune Pollackhen, Qing, MD      . simvastatin (ZOCOR) tablet 40 mg  40 mg Oral QPM Shaune Pollackhen, Qing, MD   40 mg at 07/05/18 1714  . valACYclovir (VALTREX) tablet 500 mg  500 mg Oral BID Shaune Pollackhen, Qing, MD   500 mg at 07/06/18 0912  . vitamin C (ASCORBIC ACID) tablet 250 mg  250 mg Oral BID Shaune Pollackhen, Qing, MD   250 mg at 07/06/18 0911  . zinc sulfate capsule 220 mg  220 mg Oral Daily Shaune Pollackhen, Qing, MD   220 mg at 07/06/18 0911     Discharge Medications: Please see discharge summary for a list of  discharge medications.  Relevant Imaging Results:  Relevant Lab Results:   Additional Information    Sherren KernsJennifer L Goku Harb, RN

## 2018-07-06 NOTE — Progress Notes (Signed)
Discharge report called to [facility] / iv and tele removed/ EMS called to transport

## 2018-07-06 NOTE — Discharge Summary (Signed)
[facility] [place] [facility]   PATIENT NAME: Rita BlueFrances Vang    MR#:  [id number]  DATE OF BIRTH:  08/16/1924  DATE OF ADMISSION:  07/01/2018 ADMITTING PHYSICIAN: Shaune PollackQing Chen, MD  DATE OF DISCHARGE: 07/06/2018  PRIMARY CARE PHYSICIAN: Gracelyn NurseJohnston, John D, MD    ADMISSION DIAGNOSIS:  SOB (shortness of breath) [R06.02] HCAP (healthcare-associated pneumonia) [J18.9]  DISCHARGE DIAGNOSIS:   Pneumonia-Aspiration--completed IV antibiotic treatment Acute on Chronic reanl failure-III due to Dehydration/Diuretics  SECONDARY DIAGNOSIS:   Past Medical History:  Diagnosis Date  . Anxiety   . Anxiety disorder    unspecified  . Cerebral infarction (HCC)   . CVA (cerebral vascular accident) (HCC)   . Dementia (HCC)   . Depression   . GERD (gastroesophageal reflux disease)   . GI bleed   . History of recurrent UTIs   . HLD (hyperlipidemia)   . HTN (hypertension)   . Vertigo    syncope    HOSPITAL COURSE:  FrancesHughesis a85 y.o.femalewith a known history of CVA, dementia, GERD, depression, GI bleeding, hypertension and hyperlipidemia etc. The patient presented the ED with..  *Acute respiratory failure with hypoxia due to bilateral pneumonia/Aspiration  with chronic leukocytosis -pt is at a HIGH risk for Aspiration--[place] aware -CT chest shows Patchy peripheral areas of airspace consolidation in the upper lobes and lingula. -Patient received IV cefepime x 5 days in house.. Afebrile overall stable -sats 98% on 2liter (uses home oxygen chronically 2 liter)  *Hyperkalemia -potassium 5.7-- now 4.1 is post treatment with Lokelma    *Lactic acidosis. Follow-up lactic acid level resolved  *acute on chronic renal failure stage III due to dehydration.   Status post treatment with IV fluids -creatinine 1.2 -patient is on dysphagia with nectar thick liquid. She is at a risk of getting dehydrated easily.  -I have stopped her Lasix 40  mg.  *Anemia of chronic disease. Stable.  *Hypertension.  Continue Lopressor blood pressure stable  *Atrial fibrillation continue metoprolol  -not a good anticoagulation candidate for fall risk and GI bleed in the past - continue aspirin  *Depression continue Celexa   Spoke with patient's son [name] and updated on June 21st. Patient will discharged back to her memory care assisted living place today  Pt is followed by hospice  CODE STATUS: partial code   [name] at a high risk for recurrent admission for aspiration pneumonia and dehydration given her advanced dementia and being on dysphagia diet. CONSULTS OBTAINED:    DRUG ALLERGIES:   Allergies  Allergen Reactions  . Sulfamethoxazole-Trimethoprim Other (See Comments)    Chest pain  . Oxycontin [Oxycodone Hcl] Other (See Comments)    Hallucinations   . Clarithromycin Other (See Comments)    Abdominal pain     DISCHARGE MEDICATIONS:   Allergies as of 07/06/2018      Reactions   Sulfamethoxazole-trimethoprim Other (See Comments)   Chest pain   Oxycontin [oxycodone Hcl] Other (See Comments)   Hallucinations   Clarithromycin Other (See Comments)   Abdominal pain      Medication List    STOP taking these medications   furosemide 40 MG tablet Commonly known as: LASIX   levofloxacin 500 MG tablet Commonly known as: LEVAQUIN     TAKE these medications   acetaminophen 500 MG tablet Commonly known as: TYLENOL Take 500 mg by mouth every 6 (six) hours as needed for mild pain or fever.   albuterol 108 (90 Base) MCG/ACT inhaler Commonly known as: VENTOLIN  HFA Inhale 2 puffs into the lungs every 6 (six) hours as needed for wheezing or shortness of breath.   aspirin EC 81 MG tablet Take 81 mg by mouth daily.   Atrovent HFA 17 MCG/ACT inhaler Generic drug: ipratropium Inhale 2 puffs into the lungs 3 (three) times daily as needed for wheezing.   citalopram 20 MG tablet Commonly known as: CELEXA Take 1  tablet by mouth daily.   donepezil 10 MG tablet Commonly known as: ARICEPT Take 1 tablet by mouth at bedtime.   feeding supplement (PRO-STAT SUGAR FREE 64) Liqd Take 30 mLs by mouth daily.   fluticasone 50 MCG/ACT nasal spray Commonly known as: FLONASE Place 1 spray into both nostrils daily.   galantamine 4 MG tablet Commonly known as: RAZADYNE Take 4 mg by mouth 2 (two) times daily with a meal.   guaiFENesin 100 MG/5ML liquid Commonly known as: ROBITUSSIN Take 200 mg by mouth every 6 (six) hours as needed for cough.   hydroxypropyl methylcellulose / hypromellose 2.5 % ophthalmic solution Commonly known as: ISOPTO TEARS / GONIOVISC Place 1 drop into both eyes 4 (four) times daily as needed for dry eyes.   hyoscyamine 0.125 MG/5ML Elix Commonly known as: LEVSIN Take 0.125 mg by mouth every 2 (two) hours as needed (increased secretions).   loperamide 2 MG capsule Commonly known as: IMODIUM Take 2 mg by mouth as needed for diarrhea or loose stools.   LORazepam 0.5 MG tablet Commonly known as: ATIVAN Take 0.5 mg by mouth every 4 (four) hours as needed for anxiety.   magnesium hydroxide 400 MG/5ML suspension Commonly known as: MILK OF MAGNESIA Take 30 mLs by mouth at bedtime as needed for mild constipation.   magnesium oxide 400 MG tablet Commonly known as: MAG-OX Take 400 mg by mouth daily.   metoprolol tartrate 25 MG tablet Commonly known as: LOPRESSOR Take 1 tablet (25 mg total) by mouth 2 (two) times daily.   Mi-Acid 200-200-20 MG/5ML suspension Generic drug: alum & mag hydroxide-simeth Take 30 mLs by mouth every 6 (six) hours as needed for indigestion or heartburn.   multivitamin with minerals Tabs tablet Take 1 tablet by mouth daily.   pantoprazole 40 MG tablet Commonly known as: PROTONIX Take 1 tablet (40 mg total) by mouth daily for 30 days.   QUEtiapine 50 MG tablet Commonly known as: SEROQUEL Take 50 mg by mouth at bedtime.   sennosides-docusate  sodium 8.6-50 MG tablet Commonly known as: SENOKOT-S Take 1 tablet by mouth 2 (two) times daily.   simvastatin 40 MG tablet Commonly known as: ZOCOR Take 40 mg by mouth every evening.   traMADol 50 MG tablet Commonly known as: ULTRAM Take 1 tablet (50 mg total) by mouth every 12 (twelve) hours as needed for moderate pain.   valACYclovir 500 MG tablet Commonly known as: VALTREX Take 500 mg by mouth 2 (two) times daily.   vitamin C 250 MG tablet Commonly known as: ASCORBIC ACID Take 250 mg by mouth 2 (two) times daily.   zinc sulfate 220 (50 Zn) MG capsule Take 220 mg by mouth daily.       If you experience worsening of your admission symptoms, develop shortness of breath, life threatening emergency, suicidal or homicidal thoughts you must seek medical attention immediately by calling 911 or calling your MD immediately  if symptoms less severe.  You Must read complete instructions/literature along with all the possible adverse reactions/side effects for all the Medicines you take and that have been  prescribed to you. Take any new Medicines after you have completely understood and accept all the possible adverse reactions/side effects.   Please note  You were cared for by a hospitalist during your hospital stay. If you have any questions about your discharge medications or the care you received while you were in the hospital after you are discharged, you can call the unit and asked to speak with the hospitalist on call if the hospitalist that took care of you is not available. Once you are discharged, your primary care physician will handle any further medical issues. Please note that NO REFILLS for any discharge medications will be authorized once you are discharged, as it is imperative that you return to your primary care physician (or establish a relationship with a primary care physician if you do not have one) for your aftercare needs so that they can reassess your need for  medications and monitor your lab values. Today   SUBJECTIVE   Awake and answered few questions appropriately  VITAL SIGNS:  Blood pressure 127/70, pulse 72, temperature 98 F (36.7 C), temperature source Oral, resp. rate 20, height  (1.626 m), weight 56.5 kg, SpO2 96 %.  I/O:    Intake/Output Summary (Last 24 hours) at 07/06/2018 1022 Last data filed at 07/06/2018 0100 Gross per 24 hour  Intake -  Output 600 ml  Net -600 ml    PHYSICAL EXAMINATION:  GENERAL:  85 y.o.-year-old patient lying in the bed with no acute distress. Thin fraile EYES: Pupils equal, round, reactive to light and accommodation. No scleral icterus. Extraocular muscles intact.  HEENT: Head atraumatic, normocephalic. Oropharynx and nasopharynx clear. Mucosa dry NECK:  Supple, no jugular venous distention. No thyroid enlargement, no tenderness.  LUNGS: Normal breath sounds bilaterally, no wheezing, rales,rhonchi or crepitation. No use of accessory muscles of respiration.  CARDIOVASCULAR: S1, S2 normal. No murmurs, rubs, or gallops.  ABDOMEN: Soft, non-tender, non-distended. Bowel sounds present. No organomegaly or mass.  EXTREMITIES: No pedal edema, cyanosis, or clubbing.  NEUROLOGIC: unable to examine in detail secondary to advanced dementia. Moves all extremities well. Grossly nonfocal. PSYCHIATRIC:  patient is alert and awake. Dementia at baseline. SKIN: No obvious rash, lesion, or ulcer.   DATA REVIEW:   CBC  Recent Labs  Lab 07/03/18 0902  WBC 16.1*  HGB 8.2*  HCT 30.0*  PLT 162    Chemistries  Recent Labs  Lab 07/03/18 0902  NA 144  K 4.1  CL 105  CO2 32  GLUCOSE 97  BUN 36*  CREATININE 1.20*  CALCIUM 8.3*    Microbiology Results   Recent Results (from the past 240 hour(s))  SARS Coronavirus 2 (CEPHEID- Performed in [facility] hospital lab), Hosp Order     Status: None   Collection Time: 07/01/18 11:32 AM   Specimen: Nasopharyngeal Swab  Result Value Ref Range Status    SARS Coronavirus 2 NEGATIVE NEGATIVE Final    Comment: (NOTE) If result is NEGATIVE SARS-CoV-2 target nucleic acids are NOT DETECTED. The SARS-CoV-2 RNA is generally detectable in upper and lower  respiratory specimens during the acute phase of infection. The lowest  concentration of SARS-CoV-2 viral copies this assay can detect is 250  copies / mL. A negative result does not preclude SARS-CoV-2 infection  and should not be used as the sole basis for treatment or other  patient management decisions.  A negative result may occur with  improper specimen collection / handling, submission of specimen other  than nasopharyngeal swab,  presence of viral mutation(s) within the  areas targeted by this assay, and inadequate number of viral copies  (<250 copies / mL). A negative result must be combined with clinical  observations, patient history, and epidemiological information. If result is POSITIVE SARS-CoV-2 target nucleic acids are DETECTED. The SARS-CoV-2 RNA is generally detectable in upper and lower  respiratory specimens dur ing the acute phase of infection.  Positive  results are indicative of active infection with SARS-CoV-2.  Clinical  correlation with patient history and other diagnostic information is  necessary to determine patient infection status.  Positive results do  not rule out bacterial infection or co-infection with other viruses. If result is PRESUMPTIVE POSTIVE SARS-CoV-2 nucleic acids MAY BE PRESENT.   A presumptive positive result was obtained on the submitted specimen  and confirmed on repeat testing.  While 2019 novel coronavirus  (SARS-CoV-2) nucleic acids may be present in the submitted sample  additional confirmatory testing may be necessary for epidemiological  and / or clinical management purposes  to differentiate between  SARS-CoV-2 and other Sarbecovirus currently known to infect humans.  If clinically indicated additional testing with an alternate test   methodology [phone]([id number]) is advised. The SARS-CoV-2 RNA is generally  detectable in upper and lower respiratory sp ecimens during the acute  phase of infection. The expected result is Negative. Fact Sheet for Patients:  BoilerBrush.com.cyhttps://www.fda.gov/media/136312/download Fact Sheet for Healthcare Providers: https://pope.com/https://www.fda.gov/media/136313/download This test is not yet approved or cleared by the [place] FDA and has been authorized for detection and/or diagnosis of SARS-CoV-2 by FDA under an Emergency Use Authorization (EUA).  This EUA will remain in effect (meaning this test can be used) for the duration of the COVID-19 declaration under Section 564(b)(1) of the Act, 21 U.S.C. section 360bbb-3(b)(1), unless the authorization is terminated or revoked sooner. Performed at [facility], [address] [place], [place] [address]   Culture, blood (routine x 2)     Status: None   Collection Time: 07/01/18 12:45 PM   Specimen: BLOOD  Result Value Ref Range Status   Specimen Description BLOOD BLOOD RIGHT WRIST  Final   Special Requests   Final    BOTTLES DRAWN AEROBIC AND ANAEROBIC Blood Culture results may not be optimal due to an excessive volume of blood received in culture bottles   Culture   Final    NO GROWTH 5 DAYS Performed at [facility], [address] [place], [place] [address]    Report Status 07/06/2018 FINAL  Final  Culture, blood (routine x 2)     Status: None   Collection Time: 07/01/18  4:04 PM   Specimen: BLOOD  Result Value Ref Range Status   Specimen Description BLOOD BLOOD RIGHT HAND  Final   Special Requests   Final    BOTTLES DRAWN AEROBIC AND ANAEROBIC Blood Culture adequate volume   Culture   Final    NO GROWTH 5 DAYS Performed at [facility], [address] [place], [place] [address]    Report Status 07/06/2018 FINAL  Final  MRSA PCR Screening     Status: None   Collection Time: 07/01/18  4:26 PM   Specimen: Nasopharyngeal   Result Value Ref Range Status   MRSA by PCR NEGATIVE NEGATIVE Final    Comment:        The GeneXpert MRSA Assay (FDA approved for NASAL specimens only), is one component of a comprehensive MRSA colonization surveillance program. It is not intended to diagnose MRSA infection nor  to guide or monitor treatment for MRSA infections. Performed at [facility], [address], [place], [place] [address]     RADIOLOGY:  No results found.   CODE STATUS:     Code Status Orders  (From admission, onward)         Start     Ordered   07/05/18 0958  Limited resuscitation (code)  Continuous    Question Answer Comment  In the event of cardiac or respiratory ARREST: Initiate Code Vang, Call Rapid Response Yes   In the event of cardiac or respiratory ARREST: Perform CPR No   In the event of cardiac or respiratory ARREST: Perform Intubation/Mechanical Ventilation Yes   In the event of cardiac or respiratory ARREST: Use NIPPV/BiPAp only if indicated Yes   In the event of cardiac or respiratory ARREST: Administer ACLS medications if indicated Yes   In the event of cardiac or respiratory ARREST: Perform Defibrillation or Cardioversion if indicated No      07/05/18 1001        Code Status History    Date Active Date Inactive Code Status Order ID Comments User Context   07/01/2018 1559 07/05/2018 1001 Partial Code [id number]  Demetrios Loll, MD Inpatient   01/29/2018 2241 01/31/2018 1523 Partial Code [id number]  Vaughan Basta, MD Inpatient   01/24/2018 0745 01/29/2018 2241 Full Code [id number]  Epifanio Lesches, MD ED   06/17/2017 1738 06/18/2017 1951 Partial Code [id number]  Saundra Shelling, MD Inpatient   05/07/2017 1140 05/07/2017 1817 Partial Code [id number]  Vaughan Basta, MD Inpatient   05/01/2017 0414 05/07/2017 1139 Full Code [id number]  Harrie Foreman, MD Inpatient   03/06/2017 2221 03/12/2017 1838 Full Code [id number]  Lance Coon, MD ED   08/23/2014 1801 08/24/2014 1810  Full Code [id number]  Bettey Costa, MD ED   Advance Care Planning Activity    Advance Directive Documentation     Most Recent Value  Type of Advance Directive  Out of facility DNR (pink MOST or yellow form), Healthcare Power of Attorney  Pre-existing out of facility DNR order (yellow form or pink MOST form)  -  "MOST" Form in Place?  -      TOTAL TIME TAKING CARE OF THIS PATIENT: 40 minutes.    Fritzi Mandes M.D on 07/06/2018 at 10:22 AM  Between 7am to 6pm - Pager - [phone] After 6pm go to [email or web address] - password [email or web address] [facility]  Office  [phone]  CC: Primary care physician; Baxter Hire, MD

## 2018-07-09 NOTE — ED Provider Notes (Signed)
[facility] Emergency Department Provider Note  ____________________________________________   I have reviewed the triage vital signs and the nursing notes. Where available I have reviewed prior notes and, if possible and indicated, outside hospital notes.    HISTORY  Chief Complaint Shortness of Breath    HPI Rita Vang is a 85 y.o. female   CVA presents today with cough and increased oxygen requirement.  No history of GI bleeding hypertension hyperlipidemia, she was confused, baseline history is very limited therefore.  Per EMS, patient was found hypoxic and placed on 4 L nasal cannula, level 5 chart caveat; no further history available due to patient status.   Past Medical History:  Diagnosis Date  . Anxiety   . Anxiety disorder    unspecified  . Cerebral infarction (HCC)   . CVA (cerebral vascular accident) (HCC)   . Dementia (HCC)   . Depression   . GERD (gastroesophageal reflux disease)   . GI bleed   . History of recurrent UTIs   . HLD (hyperlipidemia)   . HTN (hypertension)   . Vertigo    syncope    Patient Active Problem List   Diagnosis Date Noted  . Pneumonia 07/01/2018  . Unstageable pressure ulcer of sacral region (HCC) 01/29/2018  . TIA (transient ischemic attack) 06/17/2017  . Palliative care by specialist   . DNR (do not resuscitate) discussion   . Lewy body dementia without behavioral disturbance (HCC)   . Upper GI bleed 05/01/2017  . Acute gastrointestinal hemorrhage   . Gastric irritation   . Herpes genitalis 04/04/2017  . Protein-calorie malnutrition (HCC) 04/04/2017  . Hypomagnesemia 04/04/2017  . Vitamin B12 deficiency 04/04/2017  . Generalized weakness 04/01/2017  . Pressure injury of skin 03/07/2017  . Sepsis (HCC) 03/06/2017  . CAP (community acquired pneumonia) 03/06/2017  . HTN (hypertension) 03/06/2017  . Anxiety 03/06/2017  . HLD (hyperlipidemia) 03/06/2017  . GERD (gastroesophageal reflux disease)  03/06/2017  . CKD (chronic kidney disease), stage III (HCC) 03/06/2017  . CVA (cerebral infarction) 08/23/2014    Past Surgical History:  Procedure Laterality Date  . CARPAL TUNNEL RELEASE Right 1995   endoscopic   . CARPAL TUNNEL RELEASE Left 04/30/2013   endoscopic , Dr. Rosita KeaMenz   . CATARACT EXTRACTION Right   . CHOLECYSTECTOMY    . ELBOW SURGERY Left   . ERCP W/ SPHINCTEROTOMY AND BALLOON DILATION  2008   and endoscopy  . ESOPHAGOGASTRODUODENOSCOPY (EGD) WITH PROPOFOL N/A 05/01/2017   Procedure: ESOPHAGOGASTRODUODENOSCOPY (EGD) WITH PROPOFOL;  Surgeon: Pasty Spillersahiliani, Varnita B, MD;  Location: [facility] ENDOSCOPY;  Service: Endoscopy;  Laterality: N/A;  . ORIF DISTAL RADIUS FRACTURE Right 2002  . TOTAL KNEE ARTHROPLASTY Left 11/23/2007  . TRIGGER FINGER RELEASE  04/30/2013   third digit, incision tendon sheath for trigger finger    Prior to Admission medications   Medication Sig Start Date End Date Taking? Authorizing Provider  acetaminophen (TYLENOL) 500 MG tablet Take 500 mg by mouth every 6 (six) hours as needed for mild pain or fever.   Yes [provider]  albuterol (VENTOLIN HFA) 108 (90 Base) MCG/ACT inhaler Inhale 2 puffs into the lungs every 6 (six) hours as needed for wheezing or shortness of breath.   Yes [provider]  alum & mag hydroxide-simeth (MI-ACID) 200-200-20 MG/5ML suspension Take 30 mLs by mouth every 6 (six) hours as needed for indigestion or heartburn.   Yes [provider]  Amino Acids-Protein Hydrolys (FEEDING SUPPLEMENT, PRO-STAT SUGAR FREE 64,) LIQD  Take 30 mLs by mouth daily.   Yes [provider]  aspirin EC 81 MG tablet Take 81 mg by mouth daily.   Yes [provider]  citalopram (CELEXA) 20 MG tablet Take 1 tablet by mouth daily.  12/30/13  Yes [provider]  donepezil (ARICEPT) 10 MG tablet Take 1 tablet by mouth at bedtime.  02/02/14  Yes [provider]  fluticasone (FLONASE) 50 MCG/ACT nasal  spray Place 1 spray into both nostrils daily.  02/24/14  Yes [provider]  galantamine (RAZADYNE) 4 MG tablet Take 4 mg by mouth 2 (two) times daily with a meal.   Yes [provider]  guaiFENesin (ROBITUSSIN) 100 MG/5ML liquid Take 200 mg by mouth every 6 (six) hours as needed for cough.   Yes [provider]  hydroxypropyl methylcellulose / hypromellose (ISOPTO TEARS / GONIOVISC) 2.5 % ophthalmic solution Place 1 drop into both eyes 4 (four) times daily as needed for dry eyes.   Yes [provider]  hyoscyamine (LEVSIN) 0.125 MG/5ML ELIX Take 0.125 mg by mouth every 2 (two) hours as needed (increased secretions).   Yes [provider]  ipratropium (ATROVENT HFA) 17 MCG/ACT inhaler Inhale 2 puffs into the lungs 3 (three) times daily as needed for wheezing.    Yes [provider]  loperamide (IMODIUM) 2 MG capsule Take 2 mg by mouth as needed for diarrhea or loose stools.   Yes [provider]  LORazepam (ATIVAN) 0.5 MG tablet Take 0.5 mg by mouth every 4 (four) hours as needed for anxiety.   Yes [provider]  magnesium hydroxide (MILK OF MAGNESIA) 400 MG/5ML suspension Take 30 mLs by mouth at bedtime as needed for mild constipation.   Yes [provider]  magnesium oxide (MAG-OX) 400 MG tablet Take 400 mg by mouth daily.   Yes [provider]  metoprolol tartrate (LOPRESSOR) 25 MG tablet Take 1 tablet (25 mg total) by mouth 2 (two) times daily. 05/07/17  Yes Altamese DillingVachhani, Vaibhavkumar, MD  Multiple Vitamin (MULTIVITAMIN WITH MINERALS) TABS tablet Take 1 tablet by mouth daily.   Yes [provider]  pantoprazole (PROTONIX) 40 MG tablet Take 1 tablet (40 mg total) by mouth daily for 30 days. 01/31/18 07/01/18 Yes Pyreddy, Vivien RotaPavan, MD  QUEtiapine (SEROQUEL) 50 MG tablet Take 50 mg by mouth at bedtime.    Yes [provider]  sennosides-docusate sodium (SENOKOT-S) 8.6-50 MG tablet Take 1 tablet by mouth  2 (two) times daily.    Yes [provider]  simvastatin (ZOCOR) 40 MG tablet Take 40 mg by mouth every evening.  04/22/17  Yes [provider]  valACYclovir (VALTREX) 500 MG tablet Take 500 mg by mouth 2 (two) times daily.   Yes [provider]  vitamin C (ASCORBIC ACID) 250 MG tablet Take 250 mg by mouth 2 (two) times daily.   Yes [provider]  zinc sulfate 220 (50 Zn) MG capsule Take 220 mg by mouth daily.   Yes [provider]  traMADol (ULTRAM) 50 MG tablet Take 1 tablet (50 mg total) by mouth every 12 (twelve) hours as needed for moderate pain. 07/06/18   Enedina FinnerPatel, Sona, MD    Allergies Sulfamethoxazole-trimethoprim, Oxycontin [oxycodone hcl], and Clarithromycin  Family History  Problem Relation Age of Onset  . Heart attack Father   . Alzheimer's disease Mother     Social History Social History   Tobacco Use  . Smoking status: Never Smoker  . Smokeless tobacco:  Never Used  . Tobacco comment: quit at age 58  Substance Use Topics  . Alcohol use: Never    Frequency: Never  . Drug use: Not Currently    Review of Systems Level 5 chart caveat; no further history available due to patient status.  ____________________________________________   PHYSICAL EXAM:  VITAL SIGNS: ED Triage Vitals  Enc Vitals Group     BP 07/01/18 1137 133/60     Pulse Rate 07/01/18 1137 73     Resp 07/01/18 1137 14     Temp 07/01/18 1145 98.5 F (36.9 C)     Temp Source 07/01/18 1145 Oral     SpO2 07/01/18 1130 100 %     Weight 07/01/18 1131 121 lb (54.9 kg)     Height 07/01/18 1131 5\' 4"  (1.626 m)     Head Circumference --      Peak Flow --      Pain Score 07/01/18 2138 Asleep     Pain Loc --      Pain Edu? --      Excl. in [place]? --     Constitutional: Is an ill-appearing lady with baseline confusion Eyes: Conjunctivae are normal Head: Atraumatic HEENT: No congestion/rhinnorhea. Mucous membranes are moist.  Oropharynx non-erythematous Neck:    Nontender with no meningismus, no masses, no stridor Cardiovascular: Normal rate, regular rhythm. Grossly normal heart sounds.  Good peripheral circulation. Respiratory: Increased work of breathing, occasional rhonchi appreciated no rales no wheeze Abdominal: Soft and nontender. No distention. No guarding no rebound Musculoskeletal:  no DVT signs strong distal pulses no edema Neurologic: Exam limited Skin:  Skin is warm, dry and intact. No rash noted.   ____________________________________________   LABS (all labs ordered are listed, but only abnormal results are displayed)  Labs Reviewed  BASIC METABOLIC PANEL - Abnormal; Notable for the following components:      Result Value   BUN 32 (*)    Creatinine, Ser 1.20 (*)    Calcium 8.6 (*)    GFR calc non Af Amer 39 (*)    GFR calc Af Amer 46 (*)    All other components within normal limits  CBC - Abnormal; Notable for the following components:   WBC 16.8 (*)    RBC 3.45 (*)    Hemoglobin 8.2 (*)    HCT 29.2 (*)    MCH 23.8 (*)    MCHC 28.1 (*)    RDW 18.6 (*)    All other components within normal limits  TROPONIN I - Abnormal; Notable for the following components:   Troponin I 0.03 (*)    All other components within normal limits  PROTIME-INR - Abnormal; Notable for the following components:   Prothrombin Time 16.1 (*)    INR 1.3 (*)    All other components within normal limits  LACTIC ACID, PLASMA - Abnormal; Notable for the following components:   Lactic Acid, Venous 2.3 (*)    All other components within normal limits  BRAIN NATRIURETIC PEPTIDE - Abnormal; Notable for the following components:   B Natriuretic Peptide 1,175.0 (*)    All other components within normal limits  BASIC METABOLIC PANEL - Abnormal; Notable for the following components:   Potassium 5.7 (*)    BUN 40 (*)    Creatinine, Ser 1.35 (*)    Calcium 8.4 (*)    GFR calc non Af Amer 34 (*)    GFR calc Af Amer 40 (*)    All other components  within  normal limits  CBC - Abnormal; Notable for the following components:   WBC 17.0 (*)    RBC 3.46 (*)    Hemoglobin 8.2 (*)    HCT 29.5 (*)    MCH 23.7 (*)    MCHC 27.8 (*)    RDW 18.6 (*)    All other components within normal limits  BASIC METABOLIC PANEL - Abnormal; Notable for the following components:   BUN 36 (*)    Creatinine, Ser 1.20 (*)    Calcium 8.3 (*)    GFR calc non Af Amer 39 (*)    GFR calc Af Amer 46 (*)    All other components within normal limits  CBC - Abnormal; Notable for the following components:   WBC 16.1 (*)    RBC 3.47 (*)    Hemoglobin 8.2 (*)    HCT 30.0 (*)    MCH 23.6 (*)    MCHC 27.3 (*)    RDW 18.7 (*)    All other components within normal limits  SARS CORONAVIRUS 2 (HOSPITAL ORDER, PERFORMED IN [facility])  CULTURE, BLOOD (ROUTINE X 2)  CULTURE, BLOOD (ROUTINE X 2)  MRSA PCR SCREENING  LACTIC ACID, PLASMA  LEGIONELLA PNEUMOPHILA SEROGP 1 UR AG  STREP PNEUMONIAE URINARY ANTIGEN  PROCALCITONIN    Pertinent labs  results that were available during my care of the patient were reviewed by me and considered in my medical decision making (see chart for details). ____________________________________________  EKG  I personally interpreted any EKGs ordered by me or triage  ____________________________________________  RADIOLOGY  Pertinent labs & imaging results that were available during my care of the patient were reviewed by me and considered in my medical decision making (see chart for details). If possible, patient and/or family made aware of any abnormal findings.  No results found. ____________________________________________    PROCEDURES  Procedure(s) performed: None  Procedures  Critical Care performed: None  ____________________________________________   INITIAL IMPRESSION / ASSESSMENT AND PLAN / ED COURSE  Pertinent labs & imaging results that were available during my care of the patient were reviewed by  me and considered in my medical decision making (see chart for details).  Patient with melena, new oxygen requirement, appears to be multifocal pneumonia, giving Antibiotics and admitted.    ____________________________________________   FINAL CLINICAL IMPRESSION(S) / ED DIAGNOSES  Final diagnoses:  SOB (shortness of breath)  HCAP (healthcare-associated pneumonia)      This chart was dictated using voice recognition software.  Despite best efforts to proofread,  errors can occur which can change meaning.      Jeanmarie PlantMcShane, James A, MD 07/09/18 1538

## 2018-08-03 ENCOUNTER — Encounter: Payer: Self-pay | Admitting: Emergency Medicine

## 2018-08-03 ENCOUNTER — Inpatient Hospital Stay
Admission: EM | Admit: 2018-08-03 | Discharge: 2018-08-15 | DRG: 640 | Disposition: E | Payer: Medicare Other | Attending: Internal Medicine | Admitting: Internal Medicine

## 2018-08-03 ENCOUNTER — Emergency Department: Payer: Medicare Other

## 2018-08-03 DIAGNOSIS — K219 Gastro-esophageal reflux disease without esophagitis: Secondary | ICD-10-CM | POA: Diagnosis present

## 2018-08-03 DIAGNOSIS — Z7982 Long term (current) use of aspirin: Secondary | ICD-10-CM

## 2018-08-03 DIAGNOSIS — E870 Hyperosmolality and hypernatremia: Principal | ICD-10-CM | POA: Diagnosis present

## 2018-08-03 DIAGNOSIS — E871 Hypo-osmolality and hyponatremia: Secondary | ICD-10-CM | POA: Diagnosis present

## 2018-08-03 DIAGNOSIS — Z8673 Personal history of transient ischemic attack (TIA), and cerebral infarction without residual deficits: Secondary | ICD-10-CM

## 2018-08-03 DIAGNOSIS — Z87440 Personal history of urinary (tract) infections: Secondary | ICD-10-CM

## 2018-08-03 DIAGNOSIS — Z20828 Contact with and (suspected) exposure to other viral communicable diseases: Secondary | ICD-10-CM | POA: Diagnosis present

## 2018-08-03 DIAGNOSIS — I959 Hypotension, unspecified: Secondary | ICD-10-CM | POA: Diagnosis present

## 2018-08-03 DIAGNOSIS — E878 Other disorders of electrolyte and fluid balance, not elsewhere classified: Secondary | ICD-10-CM | POA: Diagnosis present

## 2018-08-03 DIAGNOSIS — J690 Pneumonitis due to inhalation of food and vomit: Secondary | ICD-10-CM | POA: Diagnosis present

## 2018-08-03 DIAGNOSIS — N179 Acute kidney failure, unspecified: Secondary | ICD-10-CM | POA: Diagnosis present

## 2018-08-03 DIAGNOSIS — R627 Adult failure to thrive: Secondary | ICD-10-CM | POA: Diagnosis present

## 2018-08-03 DIAGNOSIS — D649 Anemia, unspecified: Secondary | ICD-10-CM | POA: Diagnosis present

## 2018-08-03 DIAGNOSIS — E785 Hyperlipidemia, unspecified: Secondary | ICD-10-CM | POA: Diagnosis present

## 2018-08-03 DIAGNOSIS — L89103 Pressure ulcer of unspecified part of back, stage 3: Secondary | ICD-10-CM | POA: Diagnosis present

## 2018-08-03 DIAGNOSIS — F419 Anxiety disorder, unspecified: Secondary | ICD-10-CM | POA: Diagnosis present

## 2018-08-03 DIAGNOSIS — F329 Major depressive disorder, single episode, unspecified: Secondary | ICD-10-CM | POA: Diagnosis present

## 2018-08-03 DIAGNOSIS — Z820 Family history of epilepsy and other diseases of the nervous system: Secondary | ICD-10-CM

## 2018-08-03 DIAGNOSIS — Z515 Encounter for palliative care: Secondary | ICD-10-CM | POA: Diagnosis not present

## 2018-08-03 DIAGNOSIS — E860 Dehydration: Secondary | ICD-10-CM | POA: Diagnosis present

## 2018-08-03 DIAGNOSIS — L89616 Pressure-induced deep tissue damage of right heel: Secondary | ICD-10-CM | POA: Diagnosis present

## 2018-08-03 DIAGNOSIS — G9341 Metabolic encephalopathy: Secondary | ICD-10-CM | POA: Diagnosis present

## 2018-08-03 DIAGNOSIS — Z66 Do not resuscitate: Secondary | ICD-10-CM | POA: Diagnosis not present

## 2018-08-03 DIAGNOSIS — F0390 Unspecified dementia without behavioral disturbance: Secondary | ICD-10-CM | POA: Diagnosis present

## 2018-08-03 DIAGNOSIS — I10 Essential (primary) hypertension: Secondary | ICD-10-CM | POA: Diagnosis present

## 2018-08-03 DIAGNOSIS — R531 Weakness: Secondary | ICD-10-CM | POA: Diagnosis not present

## 2018-08-03 DIAGNOSIS — L89153 Pressure ulcer of sacral region, stage 3: Secondary | ICD-10-CM | POA: Diagnosis present

## 2018-08-03 DIAGNOSIS — L89626 Pressure-induced deep tissue damage of left heel: Secondary | ICD-10-CM | POA: Diagnosis present

## 2018-08-03 DIAGNOSIS — Z8249 Family history of ischemic heart disease and other diseases of the circulatory system: Secondary | ICD-10-CM

## 2018-08-03 DIAGNOSIS — Z87891 Personal history of nicotine dependence: Secondary | ICD-10-CM

## 2018-08-03 DIAGNOSIS — L8990 Pressure ulcer of unspecified site, unspecified stage: Secondary | ICD-10-CM

## 2018-08-03 LAB — CBC WITH DIFFERENTIAL/PLATELET
Abs Immature Granulocytes: 0.7 10*3/uL — ABNORMAL HIGH (ref 0.00–0.07)
Basophils Absolute: 0.1 10*3/uL (ref 0.0–0.1)
Basophils Relative: 0 %
Eosinophils Absolute: 0 10*3/uL (ref 0.0–0.5)
Eosinophils Relative: 0 %
HCT: 34.4 % — ABNORMAL LOW (ref 36.0–46.0)
Hemoglobin: 8.2 g/dL — ABNORMAL LOW (ref 12.0–15.0)
Immature Granulocytes: 3 %
Lymphocytes Relative: 9 %
Lymphs Abs: 2.2 10*3/uL (ref 0.7–4.0)
MCH: 23.3 pg — ABNORMAL LOW (ref 26.0–34.0)
MCHC: 23.8 g/dL — ABNORMAL LOW (ref 30.0–36.0)
MCV: 97.7 fL (ref 80.0–100.0)
Monocytes Absolute: 1.1 10*3/uL — ABNORMAL HIGH (ref 0.1–1.0)
Monocytes Relative: 5 %
Neutro Abs: 20.2 10*3/uL — ABNORMAL HIGH (ref 1.7–7.7)
Neutrophils Relative %: 83 %
Platelets: 192 10*3/uL (ref 150–400)
RBC: 3.52 MIL/uL — ABNORMAL LOW (ref 3.87–5.11)
RDW: 20.2 % — ABNORMAL HIGH (ref 11.5–15.5)
WBC: 24.4 10*3/uL — ABNORMAL HIGH (ref 4.0–10.5)
nRBC: 0.2 % (ref 0.0–0.2)

## 2018-08-03 LAB — COMPREHENSIVE METABOLIC PANEL
ALT: 15 U/L (ref 0–44)
AST: 25 U/L (ref 15–41)
Albumin: 2.3 g/dL — ABNORMAL LOW (ref 3.5–5.0)
Alkaline Phosphatase: 90 U/L (ref 38–126)
Anion gap: 6 (ref 5–15)
BUN: 73 mg/dL — ABNORMAL HIGH (ref 8–23)
CO2: 38 mmol/L — ABNORMAL HIGH (ref 22–32)
Calcium: 8.4 mg/dL — ABNORMAL LOW (ref 8.9–10.3)
Chloride: 130 mmol/L — ABNORMAL HIGH (ref 98–111)
Creatinine, Ser: 1.59 mg/dL — ABNORMAL HIGH (ref 0.44–1.00)
GFR calc Af Amer: 33 mL/min — ABNORMAL LOW (ref >60)
GFR calc non Af Amer: 28 mL/min — ABNORMAL LOW (ref >60)
Glucose, Bld: 112 mg/dL — ABNORMAL HIGH (ref 70–99)
Potassium: 4.2 mmol/L (ref 3.5–5.1)
Sodium: 174 mmol/L (ref 135–145)
Total Bilirubin: 0.6 mg/dL (ref 0.3–1.2)
Total Protein: 6 g/dL — ABNORMAL LOW (ref 6.5–8.1)

## 2018-08-03 LAB — LACTIC ACID, PLASMA: Lactic Acid, Venous: 1.8 mmol/L (ref 0.5–1.9)

## 2018-08-03 LAB — URINALYSIS, COMPLETE (UACMP) WITH MICROSCOPIC
Bilirubin Urine: NEGATIVE
Glucose, UA: NEGATIVE mg/dL
Hgb urine dipstick: NEGATIVE
Ketones, ur: NEGATIVE mg/dL
Leukocytes,Ua: NEGATIVE
Nitrite: NEGATIVE
Protein, ur: NEGATIVE mg/dL
Specific Gravity, Urine: 1.018 (ref 1.005–1.030)
pH: 5 (ref 5.0–8.0)

## 2018-08-03 LAB — SARS CORONAVIRUS 2 BY RT PCR (HOSPITAL ORDER, PERFORMED IN ~~LOC~~ HOSPITAL LAB): SARS Coronavirus 2: NEGATIVE

## 2018-08-03 MED ORDER — SODIUM CHLORIDE 0.9 % IV BOLUS
500.0000 mL | Freq: Once | INTRAVENOUS | Status: AC
  Administered 2018-08-03: 500 mL via INTRAVENOUS

## 2018-08-03 MED ORDER — METRONIDAZOLE IN NACL 5-0.79 MG/ML-% IV SOLN
500.0000 mg | Freq: Once | INTRAVENOUS | Status: AC
  Administered 2018-08-04: 05:00:00 500 mg via INTRAVENOUS
  Filled 2018-08-03: qty 100

## 2018-08-03 MED ORDER — SODIUM CHLORIDE 0.9 % IV SOLN: Freq: Once | INTRAVENOUS | Status: DC

## 2018-08-03 MED ORDER — SODIUM CHLORIDE 0.9 % IV SOLN
2.0000 g | Freq: Once | INTRAVENOUS | Status: AC
  Administered 2018-08-03: 23:00:00 2 g via INTRAVENOUS
  Filled 2018-08-03: qty 2

## 2018-08-03 MED ORDER — VANCOMYCIN HCL IN DEXTROSE 1-5 GM/200ML-% IV SOLN
1000.0000 mg | Freq: Once | INTRAVENOUS | Status: AC
  Administered 2018-08-04: 1000 mg via INTRAVENOUS
  Filled 2018-08-03: qty 200

## 2018-08-03 NOTE — ED Triage Notes (Signed)
Pt arrived via EMS from [facility] due to previously drawn lab work showed pt was dehydrated. Per staff, pt takes in limited oral intake and has limited output. Pt has DNR and is under Hospice care. Pt is not A&O. Pt has multiple areas to posterior of breakdown with facility bandages in place. Pt is on 4L O2 on arrival.

## 2018-08-03 NOTE — ED Provider Notes (Addendum)
[facility] Emergency Department Provider Note  ____________________________________________   I have reviewed the triage vital signs and the nursing notes. Where available I have reviewed prior notes and, if possible and indicated, outside hospital notes.    HISTORY  Chief Complaint Failure To Thrive and Dehydration    HPI Rita Vang is a 85 y.o. female with advanced dementia, history of CVAs, history of GI bleeds recurrent urinary tract infection hypertension, decubitus ulcers, who is DNR and so far as the family does not want CPR electricity but son would possibly want her to be intubated.  Depending on the circumstances.  He is the POA.   Apparently, there was concern about her white count being elevated a couple days ago and she is taking decreased p.o. and there was concern that she was dehydrated.  Herself cannot give a history.  Patient seen and evaluated during the coronavirus epidemic during a time with low staffing level 5 chart caveat; no further history available due to patient status.     Past Medical History:  Diagnosis Date  . Anxiety   . Anxiety disorder    unspecified  . Cerebral infarction (HCC)   . CVA (cerebral vascular accident) (HCC)   . Dementia (HCC)   . Depression   . GERD (gastroesophageal reflux disease)   . GI bleed   . History of recurrent UTIs   . HLD (hyperlipidemia)   . HTN (hypertension)   . Vertigo    syncope    Patient Active Problem List   Diagnosis Date Noted  . Pneumonia 07/01/2018  . Unstageable pressure ulcer of sacral region (HCC) 01/29/2018  . TIA (transient ischemic attack) 06/17/2017  . Palliative care by specialist   . DNR (do not resuscitate) discussion   . Lewy body dementia without behavioral disturbance (HCC)   . Upper GI bleed 05/01/2017  . Acute gastrointestinal hemorrhage   . Gastric irritation   . Herpes genitalis 04/04/2017  . Protein-calorie malnutrition (HCC) 04/04/2017  .  Hypomagnesemia 04/04/2017  . Vitamin B12 deficiency 04/04/2017  . Generalized weakness 04/01/2017  . Pressure injury of skin 03/07/2017  . Sepsis (HCC) 03/06/2017  . CAP (community acquired pneumonia) 03/06/2017  . HTN (hypertension) 03/06/2017  . Anxiety 03/06/2017  . HLD (hyperlipidemia) 03/06/2017  . GERD (gastroesophageal reflux disease) 03/06/2017  . CKD (chronic kidney disease), stage III (HCC) 03/06/2017  . CVA (cerebral infarction) 08/23/2014    Past Surgical History:  Procedure Laterality Date  . CARPAL TUNNEL RELEASE Right 1995   endoscopic   . CARPAL TUNNEL RELEASE Left 04/30/2013   endoscopic , Dr. Rosita KeaMenz   . CATARACT EXTRACTION Right   . CHOLECYSTECTOMY    . ELBOW SURGERY Left   . ERCP W/ SPHINCTEROTOMY AND BALLOON DILATION  2008   and endoscopy  . ESOPHAGOGASTRODUODENOSCOPY (EGD) WITH PROPOFOL N/A 05/01/2017   Procedure: ESOPHAGOGASTRODUODENOSCOPY (EGD) WITH PROPOFOL;  Surgeon: Pasty Spillersahiliani, Varnita B, MD;  Location: [facility] ENDOSCOPY;  Service: Endoscopy;  Laterality: N/A;  . ORIF DISTAL RADIUS FRACTURE Right 2002  . TOTAL KNEE ARTHROPLASTY Left 11/23/2007  . TRIGGER FINGER RELEASE  04/30/2013   third digit, incision tendon sheath for trigger finger    Prior to Admission medications   Medication Sig Start Date End Date Taking? Authorizing Provider  aspirin EC 81 MG tablet Take 81 mg by mouth daily.   Yes [provider]  citalopram (CELEXA) 20 MG tablet Take 1 tablet by mouth daily.  12/30/13  Yes [provider]  donepezil (  ARICEPT) 10 MG tablet Take 1 tablet by mouth at bedtime.  02/02/14  Yes [provider]  fluticasone (FLONASE) 50 MCG/ACT nasal spray Place 1 spray into both nostrils daily.  02/24/14  Yes [provider]  galantamine (RAZADYNE) 4 MG tablet Take 4 mg by mouth 2 (two) times daily with a meal.   Yes [provider]  ipratropium (ATROVENT HFA) 17 MCG/ACT inhaler Inhale 2 puffs into the lungs 3 (three) times  daily as needed for wheezing.    Yes [provider]  LORazepam (ATIVAN) 0.5 MG tablet Take 0.5 mg by mouth every 4 (four) hours as needed for anxiety.   Yes [provider]  magnesium oxide (MAG-OX) 400 MG tablet Take 400 mg by mouth daily.   Yes [provider]  metoprolol tartrate (LOPRESSOR) 25 MG tablet Take 1 tablet (25 mg total) by mouth 2 (two) times daily. 05/07/17  Yes Vaughan Basta, MD  Multiple Vitamin (MULTIVITAMIN WITH MINERALS) TABS tablet Take 1 tablet by mouth daily.   Yes [provider]  QUEtiapine (SEROQUEL) 50 MG tablet Take 50 mg by mouth at bedtime.    Yes [provider]  simvastatin (ZOCOR) 40 MG tablet Take 40 mg by mouth every evening.  04/22/17  Yes [provider]  traMADol (ULTRAM) 50 MG tablet Take 1 tablet (50 mg total) by mouth every 12 (twelve) hours as needed for moderate pain. 07/06/18  Yes Fritzi Mandes, MD  vitamin C (ASCORBIC ACID) 250 MG tablet Take 250 mg by mouth 2 (two) times daily.   Yes [provider]  zinc sulfate 220 (50 Zn) MG capsule Take 220 mg by mouth daily.   Yes [provider]  acetaminophen (TYLENOL) 500 MG tablet Take 500 mg by mouth every 6 (six) hours as needed for mild pain or fever.    [provider]  albuterol (VENTOLIN HFA) 108 (90 Base) MCG/ACT inhaler Inhale 2 puffs into the lungs every 6 (six) hours as needed for wheezing or shortness of breath.    [provider]  alum & mag hydroxide-simeth (MI-ACID) 200-200-20 MG/5ML suspension Take 30 mLs by mouth every 6 (six) hours as needed for indigestion or heartburn.    [provider]  Amino Acids-Protein Hydrolys (FEEDING SUPPLEMENT, PRO-STAT SUGAR FREE 64,) LIQD Take 30 mLs by mouth daily.    [provider]  guaiFENesin (ROBITUSSIN) 100 MG/5ML liquid Take 200 mg by mouth every 6 (six) hours as needed for cough.    [provider]  hydroxypropyl methylcellulose /  hypromellose (ISOPTO TEARS / GONIOVISC) 2.5 % ophthalmic solution Place 1 drop into both eyes 4 (four) times daily as needed for dry eyes.    [provider]  hyoscyamine (LEVSIN) 0.125 MG/5ML ELIX Take 0.125 mg by mouth every 2 (two) hours as needed (increased secretions).    [provider]  loperamide (IMODIUM) 2 MG capsule Take 2 mg by mouth as needed for diarrhea or loose stools.    [provider]  magnesium hydroxide (MILK OF MAGNESIA) 400 MG/5ML suspension Take 30 mLs by mouth at bedtime as needed for mild constipation.    [provider]  pantoprazole (PROTONIX) 40 MG tablet Take 1 tablet (40 mg total) by mouth daily for 30 days. 01/31/18 07/01/18  Saundra Shelling, MD  sennosides-docusate sodium (SENOKOT-S) 8.6-50 MG tablet Take 1 tablet by mouth 2 (two) times daily.     [provider]  valACYclovir (VALTREX) 500 MG tablet Take 500 mg by  mouth 2 (two) times daily.    [provider]    Allergies Sulfamethoxazole-trimethoprim, Oxycontin [oxycodone hcl], and Clarithromycin  Family History  Problem Relation Age of Onset  . Heart attack Father   . Alzheimer's disease Mother     Social History Social History   Tobacco Use  . Smoking status: Never Smoker  . Smokeless tobacco: Never Used  . Tobacco comment: quit at age 85  Substance Use Topics  . Alcohol use: Never    Frequency: Never  . Drug use: Not Currently    Review of Systems Level 5 chart caveat; no further history available due to patient status.   ____________________________________________   PHYSICAL EXAM:  VITAL SIGNS: ED Triage Vitals [07/28/2018 1919]  Enc Vitals Group     BP (!) 94/59     Pulse Rate 98     Resp (!) 23     Temp 97.6 F (36.4 C)     Temp Source Rectal     SpO2 100 %     Weight      Height      Head Circumference      Peak Flow      Pain Score      Pain Loc      Pain Edu?      Excl. in [place]?     Constitutional: Alert at this  time, chronically ill-appearing woman.  She does not appear to be any distress. Eyes: Conjunctivae are normal Head: Atraumatic HEENT: No congestion/rhinnorhea. Mucous membranes are dry.  Oropharynx non-erythematous Neck:   Nontender with no meningismus, no masses, no stridor Cardiovascular: Normal rate, regular rhythm. Grossly normal heart sounds.  Good peripheral circulation. Respiratory: Normal respiratory effort.  No retractions. Lungs CTAB. Abdominal: Soft and nontender. No distention. No guarding no rebound Back: Decubitus ulcers noted of her back there is no midline tenderness there are no lesions noted. there is no CVA tenderness  Musculoskeletal: No lower extremity tenderness, no upper extremity tenderness. No joint effusions, no DVT signs strong distal pulses no edema Neurologic: Cannot obtain Skin:  Skin is warm, dry and intact.  Given his ulcers on the back, as well as her feet  ____________________________________________   LABS (all labs ordered are listed, but only abnormal results are displayed)  Labs Reviewed  CULTURE, BLOOD (ROUTINE X 2)  CULTURE, BLOOD (ROUTINE X 2)  URINE CULTURE  SARS CORONAVIRUS 2 (HOSPITAL ORDER, PERFORMED IN [facility])  COMPREHENSIVE METABOLIC PANEL  CBC WITH DIFFERENTIAL/PLATELET  URINALYSIS, COMPLETE (UACMP) WITH MICROSCOPIC    Pertinent labs  results that were available during my care of the patient were reviewed by me and considered in my medical decision making (see chart for details). ____________________________________________  EKG  I personally interpreted any EKGs ordered by me or triage  ____________________________________________  RADIOLOGY  Pertinent labs & imaging results that were available during my care of the patient were reviewed by me and considered in my medical decision making (see chart for details). If possible, patient and/or family made aware of any abnormal findings.  No results  found. ____________________________________________    PROCEDURES  Procedure(s) performed: None  Procedures  Critical Care performed: None  ____________________________________________   INITIAL IMPRESSION / ASSESSMENT AND PLAN / ED COURSE  Pertinent labs & imaging results that were available during my care of the patient were reviewed by me and considered in my medical decision making (see chart for details).  Very ill and deconditioned appearing woman chronically presents today because  of concern about white blood count and dehydration.  [name] in significant poor condition.  She has decubitus ulcers she is unable to talk at baseline she is not interactive.  I did talk to the son.  He does not want her to be shocked or resuscitated but he does feel that situational intubation might be indicated.  He does want antibiotics and care.  I did explain to him my concerns with the patient's quality of life is significantly poor and that comfort measures might be appropriate however he would prefer hydration antibiotics as needed and further care as her condition dictates.  Clearly this is his choice to make.  We will therefore start her with IV hydration, we will check her urine likely have to do in and out cath to do so, we will check chest x-ray she has a history recurrent pneumonias, and we will check basic blood work and reassess.  ----------------------------------------- 10:53 PM on 07/19/2018 ----------------------------------------- Given white count, even though there is no clear source she does have significant decubitus ulcers, we will empirically treat for possible infection.   Patient sodium is 174, which is quite remarkable.  We are giving her hydration and she is admitted to the hospitalist.    ____________________________________________   FINAL CLINICAL IMPRESSION(S) / ED DIAGNOSES  Final diagnoses:  Weakness      This chart was dictated using voice  recognition software.  Despite best efforts to proofread,  errors can occur which can change meaning.      Jeanmarie PlantMcShane, Roshonda Sperl A, MD 08/01/2018 2039    Jeanmarie PlantMcShane, Anselma Herbel A, MD 08/04/2018 2253

## 2018-08-03 NOTE — Consult Note (Addendum)
PHARMACY -  BRIEF ANTIBIOTIC NOTE   Pharmacy has received consult(s) for Cefepime and Vancomycin from an ED provider.  The patient's profile has been reviewed for ht/wt/allergies/indication/available labs.    Attempted to call RN multiple times for patient's weight.   One time order(s) placed for Vancomycin 1g and cefepime 2g based on  Last recorded weight from 11/2017.   Further antibiotics/pharmacy consults should be ordered by admitting physician if indicated.                       Thank you, Pernell Dupre, PharmD, BCPS Clinical Pharmacist 2018-08-06 11:48 PM

## 2018-08-04 DIAGNOSIS — F419 Anxiety disorder, unspecified: Secondary | ICD-10-CM | POA: Diagnosis present

## 2018-08-04 DIAGNOSIS — E871 Hypo-osmolality and hyponatremia: Secondary | ICD-10-CM | POA: Diagnosis present

## 2018-08-04 DIAGNOSIS — Z515 Encounter for palliative care: Secondary | ICD-10-CM | POA: Diagnosis not present

## 2018-08-04 DIAGNOSIS — Z8673 Personal history of transient ischemic attack (TIA), and cerebral infarction without residual deficits: Secondary | ICD-10-CM | POA: Diagnosis not present

## 2018-08-04 DIAGNOSIS — F0390 Unspecified dementia without behavioral disturbance: Secondary | ICD-10-CM | POA: Diagnosis present

## 2018-08-04 DIAGNOSIS — N179 Acute kidney failure, unspecified: Secondary | ICD-10-CM | POA: Diagnosis present

## 2018-08-04 DIAGNOSIS — L89103 Pressure ulcer of unspecified part of back, stage 3: Secondary | ICD-10-CM | POA: Diagnosis present

## 2018-08-04 DIAGNOSIS — F329 Major depressive disorder, single episode, unspecified: Secondary | ICD-10-CM | POA: Diagnosis present

## 2018-08-04 DIAGNOSIS — G9341 Metabolic encephalopathy: Secondary | ICD-10-CM | POA: Diagnosis present

## 2018-08-04 DIAGNOSIS — Z7189 Other specified counseling: Secondary | ICD-10-CM

## 2018-08-04 DIAGNOSIS — L89153 Pressure ulcer of sacral region, stage 3: Secondary | ICD-10-CM | POA: Diagnosis present

## 2018-08-04 DIAGNOSIS — E878 Other disorders of electrolyte and fluid balance, not elsewhere classified: Secondary | ICD-10-CM | POA: Diagnosis present

## 2018-08-04 DIAGNOSIS — E870 Hyperosmolality and hypernatremia: Secondary | ICD-10-CM | POA: Diagnosis present

## 2018-08-04 DIAGNOSIS — R627 Adult failure to thrive: Secondary | ICD-10-CM | POA: Diagnosis present

## 2018-08-04 DIAGNOSIS — R531 Weakness: Secondary | ICD-10-CM | POA: Diagnosis present

## 2018-08-04 DIAGNOSIS — Z66 Do not resuscitate: Secondary | ICD-10-CM | POA: Diagnosis not present

## 2018-08-04 DIAGNOSIS — E785 Hyperlipidemia, unspecified: Secondary | ICD-10-CM | POA: Diagnosis present

## 2018-08-04 DIAGNOSIS — Z87440 Personal history of urinary (tract) infections: Secondary | ICD-10-CM | POA: Diagnosis not present

## 2018-08-04 DIAGNOSIS — J690 Pneumonitis due to inhalation of food and vomit: Secondary | ICD-10-CM | POA: Diagnosis present

## 2018-08-04 DIAGNOSIS — Z20828 Contact with and (suspected) exposure to other viral communicable diseases: Secondary | ICD-10-CM | POA: Diagnosis present

## 2018-08-04 DIAGNOSIS — E860 Dehydration: Secondary | ICD-10-CM | POA: Diagnosis present

## 2018-08-04 DIAGNOSIS — L89626 Pressure-induced deep tissue damage of left heel: Secondary | ICD-10-CM | POA: Diagnosis present

## 2018-08-04 DIAGNOSIS — I10 Essential (primary) hypertension: Secondary | ICD-10-CM | POA: Diagnosis present

## 2018-08-04 DIAGNOSIS — L89616 Pressure-induced deep tissue damage of right heel: Secondary | ICD-10-CM | POA: Diagnosis present

## 2018-08-04 DIAGNOSIS — Z87891 Personal history of nicotine dependence: Secondary | ICD-10-CM | POA: Diagnosis not present

## 2018-08-04 DIAGNOSIS — I959 Hypotension, unspecified: Secondary | ICD-10-CM | POA: Diagnosis present

## 2018-08-04 DIAGNOSIS — K219 Gastro-esophageal reflux disease without esophagitis: Secondary | ICD-10-CM | POA: Diagnosis present

## 2018-08-04 LAB — BASIC METABOLIC PANEL
BUN: 75 mg/dL — ABNORMAL HIGH (ref 8–23)
CO2: 35 mmol/L — ABNORMAL HIGH (ref 22–32)
Calcium: 8.5 mg/dL — ABNORMAL LOW (ref 8.9–10.3)
Chloride: >130 mmol/L (ref 98–111)
Creatinine, Ser: 1.58 mg/dL — ABNORMAL HIGH (ref 0.44–1.00)
GFR calc Af Amer: 33 mL/min — ABNORMAL LOW (ref >60)
GFR calc non Af Amer: 28 mL/min — ABNORMAL LOW (ref >60)
Glucose, Bld: 116 mg/dL — ABNORMAL HIGH (ref 70–99)
Potassium: 4.4 mmol/L (ref 3.5–5.1)
Sodium: 173 mmol/L (ref 135–145)

## 2018-08-04 LAB — CORTISOL: Cortisol, Plasma: 30.2 ug/dL

## 2018-08-04 LAB — OSMOLALITY, URINE: Osmolality, Ur: 589 mOsm/kg (ref 300–900)

## 2018-08-04 LAB — CBC
HCT: 37.1 % (ref 36.0–46.0)
Hemoglobin: 8.7 g/dL — ABNORMAL LOW (ref 12.0–15.0)
MCH: 23.2 pg — ABNORMAL LOW (ref 26.0–34.0)
MCHC: 23.5 g/dL — ABNORMAL LOW (ref 30.0–36.0)
MCV: 98.9 fL (ref 80.0–100.0)
Platelets: 217 10*3/uL (ref 150–400)
RBC: 3.75 MIL/uL — ABNORMAL LOW (ref 3.87–5.11)
RDW: 20.2 % — ABNORMAL HIGH (ref 11.5–15.5)
WBC: 31.2 10*3/uL — ABNORMAL HIGH (ref 4.0–10.5)
nRBC: 0.3 % — ABNORMAL HIGH (ref 0.0–0.2)

## 2018-08-04 LAB — TSH: TSH: 3.075 u[IU]/mL (ref 0.350–4.500)

## 2018-08-04 LAB — LACTIC ACID, PLASMA: Lactic Acid, Venous: 3.3 mmol/L (ref 0.5–1.9)

## 2018-08-04 LAB — OSMOLALITY: Osmolality: 395 mOsm/kg (ref 275–295)

## 2018-08-04 LAB — URINE CULTURE: Culture: NO GROWTH

## 2018-08-04 LAB — SODIUM, URINE, RANDOM: Sodium, Ur: 15 mmol/L

## 2018-08-04 MED ORDER — DEXTROSE 5 % IV SOLN
INTRAVENOUS | Status: DC
  Administered 2018-08-04: 05:00:00 via INTRAVENOUS

## 2018-08-04 MED ORDER — GLYCOPYRROLATE 1 MG PO TABS
1.0000 mg | ORAL_TABLET | ORAL | Status: DC | PRN
  Filled 2018-08-04: qty 1

## 2018-08-04 MED ORDER — ONDANSETRON HCL 4 MG/2ML IJ SOLN: 4.0000 mg | Freq: Four times a day (QID) | INTRAMUSCULAR | Status: DC | PRN

## 2018-08-04 MED ORDER — HALOPERIDOL LACTATE 2 MG/ML PO CONC
0.5000 mg | ORAL | Status: DC | PRN
  Filled 2018-08-04: qty 0.3

## 2018-08-04 MED ORDER — TRAZODONE HCL 50 MG PO TABS: 25.0000 mg | ORAL_TABLET | Freq: Every evening | ORAL | Status: DC | PRN

## 2018-08-04 MED ORDER — GALANTAMINE HYDROBROMIDE 4 MG PO TABS
4.0000 mg | ORAL_TABLET | Freq: Two times a day (BID) | ORAL | Status: DC
  Filled 2018-08-04 (×2): qty 1

## 2018-08-04 MED ORDER — GLYCOPYRROLATE 0.2 MG/ML IJ SOLN
0.2000 mg | INTRAMUSCULAR | Status: DC | PRN
  Administered 2018-08-04 – 2018-08-05 (×3): 0.2 mg via INTRAVENOUS
  Filled 2018-08-04 (×4): qty 1

## 2018-08-04 MED ORDER — GLYCOPYRROLATE 0.2 MG/ML IJ SOLN
0.2000 mg | INTRAMUSCULAR | Status: DC | PRN
  Filled 2018-08-04: qty 1

## 2018-08-04 MED ORDER — DONEPEZIL HCL 5 MG PO TABS
10.0000 mg | ORAL_TABLET | Freq: Every day | ORAL | Status: DC
  Filled 2018-08-04: qty 2

## 2018-08-04 MED ORDER — LORAZEPAM 2 MG/ML IJ SOLN: 0.5000 mg | INTRAMUSCULAR | Status: DC | PRN

## 2018-08-04 MED ORDER — ENOXAPARIN SODIUM 40 MG/0.4ML ~~LOC~~ SOLN: 40.0000 mg | SUBCUTANEOUS | Status: DC

## 2018-08-04 MED ORDER — SENNOSIDES-DOCUSATE SODIUM 8.6-50 MG PO TABS: 1.0000 | ORAL_TABLET | Freq: Two times a day (BID) | ORAL | Status: DC | PRN

## 2018-08-04 MED ORDER — HALOPERIDOL LACTATE 5 MG/ML IJ SOLN: 0.5000 mg | INTRAMUSCULAR | Status: DC | PRN

## 2018-08-04 MED ORDER — SODIUM CHLORIDE 0.9 % IV SOLN
3.0000 g | Freq: Two times a day (BID) | INTRAVENOUS | Status: DC
  Administered 2018-08-04: 3 g via INTRAVENOUS
  Filled 2018-08-04 (×2): qty 8
  Filled 2018-08-04: qty 3

## 2018-08-04 MED ORDER — LORAZEPAM 2 MG/ML PO CONC: 1.0000 mg | ORAL | Status: DC | PRN

## 2018-08-04 MED ORDER — FLUTICASONE PROPIONATE 50 MCG/ACT NA SUSP
1.0000 | Freq: Every day | NASAL | Status: DC
  Filled 2018-08-04: qty 16

## 2018-08-04 MED ORDER — MAGNESIUM HYDROXIDE 400 MG/5ML PO SUSP: 30.0000 mL | Freq: Every day | ORAL | Status: DC | PRN

## 2018-08-04 MED ORDER — SODIUM CHLORIDE 0.9% FLUSH: 3.0000 mL | INTRAVENOUS | Status: DC | PRN

## 2018-08-04 MED ORDER — BIOTENE DRY MOUTH MT LIQD: 15.0000 mL | OROMUCOSAL | Status: DC | PRN

## 2018-08-04 MED ORDER — MORPHINE SULFATE (PF) 2 MG/ML IV SOLN
1.0000 mg | INTRAVENOUS | Status: DC | PRN
  Filled 2018-08-04: qty 1

## 2018-08-04 MED ORDER — ADULT MULTIVITAMIN W/MINERALS CH: 1.0000 | ORAL_TABLET | Freq: Every day | ORAL | Status: DC

## 2018-08-04 MED ORDER — METOPROLOL TARTRATE 25 MG PO TABS: 25.0000 mg | ORAL_TABLET | Freq: Two times a day (BID) | ORAL | Status: DC

## 2018-08-04 MED ORDER — GUAIFENESIN 100 MG/5ML PO SOLN
200.0000 mg | Freq: Four times a day (QID) | ORAL | Status: DC | PRN
  Filled 2018-08-04: qty 10

## 2018-08-04 MED ORDER — LORAZEPAM 0.5 MG PO TABS: 0.5000 mg | ORAL_TABLET | ORAL | Status: DC | PRN

## 2018-08-04 MED ORDER — SODIUM CHLORIDE 0.9% FLUSH: 3.0000 mL | Freq: Two times a day (BID) | INTRAVENOUS | Status: DC

## 2018-08-04 MED ORDER — POLYVINYL ALCOHOL 1.4 % OP SOLN
1.0000 [drp] | Freq: Four times a day (QID) | OPHTHALMIC | Status: DC | PRN
  Filled 2018-08-04: qty 15

## 2018-08-04 MED ORDER — ACETAMINOPHEN 325 MG PO TABS: 650.0000 mg | ORAL_TABLET | Freq: Four times a day (QID) | ORAL | Status: DC | PRN

## 2018-08-04 MED ORDER — ALBUTEROL SULFATE (2.5 MG/3ML) 0.083% IN NEBU: 3.0000 mL | INHALATION_SOLUTION | Freq: Four times a day (QID) | RESPIRATORY_TRACT | Status: DC | PRN

## 2018-08-04 MED ORDER — CITALOPRAM HYDROBROMIDE 20 MG PO TABS: 20.0000 mg | ORAL_TABLET | Freq: Every day | ORAL | Status: DC

## 2018-08-04 MED ORDER — VITAMIN C 500 MG PO TABS: 250.0000 mg | ORAL_TABLET | Freq: Two times a day (BID) | ORAL | Status: DC

## 2018-08-04 MED ORDER — SODIUM CHLORIDE 0.9 % IV SOLN
500.0000 mg | INTRAVENOUS | Status: DC
  Administered 2018-08-04: 500 mg via INTRAVENOUS
  Filled 2018-08-04 (×2): qty 500

## 2018-08-04 MED ORDER — HYOSCYAMINE SULFATE 0.125 MG/5ML PO ELIX
0.1250 mg | ORAL_SOLUTION | ORAL | Status: DC | PRN
  Filled 2018-08-04: qty 5

## 2018-08-04 MED ORDER — PRO-STAT SUGAR FREE PO LIQD: 30.0000 mL | Freq: Every day | ORAL | Status: DC

## 2018-08-04 MED ORDER — ACETAMINOPHEN 650 MG RE SUPP: 650.0000 mg | Freq: Four times a day (QID) | RECTAL | Status: DC | PRN

## 2018-08-04 MED ORDER — ALUM & MAG HYDROXIDE-SIMETH 200-200-20 MG/5ML PO SUSP: 30.0000 mL | Freq: Four times a day (QID) | ORAL | Status: DC | PRN

## 2018-08-04 MED ORDER — LORAZEPAM 1 MG PO TABS: 1.0000 mg | ORAL_TABLET | ORAL | Status: DC | PRN

## 2018-08-04 MED ORDER — SIMVASTATIN 20 MG PO TABS: 40.0000 mg | ORAL_TABLET | Freq: Every evening | ORAL | Status: DC

## 2018-08-04 MED ORDER — ONDANSETRON 4 MG PO TBDP
4.0000 mg | ORAL_TABLET | Freq: Four times a day (QID) | ORAL | Status: DC | PRN
  Filled 2018-08-04: qty 1

## 2018-08-04 MED ORDER — MORPHINE SULFATE (CONCENTRATE) 10 MG/0.5ML PO SOLN: 5.0000 mg | ORAL | Status: DC | PRN

## 2018-08-04 MED ORDER — MAGNESIUM HYDROXIDE 400 MG/5ML PO SUSP: 30.0000 mL | Freq: Every evening | ORAL | Status: DC | PRN

## 2018-08-04 MED ORDER — HALOPERIDOL 0.5 MG PO TABS
0.5000 mg | ORAL_TABLET | ORAL | Status: DC | PRN
  Filled 2018-08-04: qty 1

## 2018-08-04 MED ORDER — HEPARIN SODIUM (PORCINE) 5000 UNIT/ML IJ SOLN
5000.0000 [IU] | Freq: Three times a day (TID) | INTRAMUSCULAR | Status: DC
  Administered 2018-08-04: 5000 [IU] via SUBCUTANEOUS
  Filled 2018-08-04: qty 1

## 2018-08-04 MED ORDER — MORPHINE SULFATE (PF) 2 MG/ML IV SOLN
1.0000 mg | INTRAVENOUS | Status: DC | PRN
  Administered 2018-08-04 (×4): 1 mg via INTRAVENOUS
  Administered 2018-08-05 (×3): 2 mg via INTRAVENOUS
  Administered 2018-08-05: 1 mg via INTRAVENOUS
  Filled 2018-08-04 (×8): qty 1

## 2018-08-04 MED ORDER — POLYVINYL ALCOHOL 1.4 % OP SOLN: 1.0000 [drp] | Freq: Four times a day (QID) | OPHTHALMIC | Status: DC | PRN

## 2018-08-04 MED ORDER — IPRATROPIUM BROMIDE 0.02 % IN SOLN: 2.5000 mL | RESPIRATORY_TRACT | Status: DC | PRN

## 2018-08-04 MED ORDER — TRAMADOL HCL 50 MG PO TABS: 50.0000 mg | ORAL_TABLET | Freq: Two times a day (BID) | ORAL | Status: DC | PRN

## 2018-08-04 MED ORDER — ACETAMINOPHEN 650 MG RE SUPP
650.0000 mg | Freq: Four times a day (QID) | RECTAL | Status: DC | PRN
  Administered 2018-08-05: 06:00:00 650 mg via RECTAL
  Filled 2018-08-04: qty 1

## 2018-08-04 MED ORDER — SODIUM CHLORIDE 0.9 % IV SOLN: 250.0000 mL | INTRAVENOUS | Status: DC | PRN

## 2018-08-04 MED ORDER — ONDANSETRON HCL 4 MG PO TABS: 4.0000 mg | ORAL_TABLET | Freq: Four times a day (QID) | ORAL | Status: DC | PRN

## 2018-08-04 MED ORDER — ZINC SULFATE 220 (50 ZN) MG PO CAPS: 220.0000 mg | ORAL_CAPSULE | Freq: Every day | ORAL | Status: DC

## 2018-08-04 MED ORDER — QUETIAPINE FUMARATE 25 MG PO TABS: 50.0000 mg | ORAL_TABLET | Freq: Every day | ORAL | Status: DC

## 2018-08-04 MED ORDER — ASPIRIN EC 81 MG PO TBEC: 81.0000 mg | DELAYED_RELEASE_TABLET | Freq: Every day | ORAL | Status: DC

## 2018-08-04 MED ORDER — MAGNESIUM OXIDE 400 (241.3 MG) MG PO TABS: 400.0000 mg | ORAL_TABLET | Freq: Every day | ORAL | Status: DC

## 2018-08-04 MED ORDER — DIGOXIN 0.25 MG/ML IJ SOLN
0.1250 mg | Freq: Once | INTRAMUSCULAR | Status: AC
  Administered 2018-08-04: 0.125 mg via INTRAVENOUS
  Filled 2018-08-04: qty 2

## 2018-08-04 MED ORDER — LOPERAMIDE HCL 2 MG PO CAPS: 2.0000 mg | ORAL_CAPSULE | ORAL | Status: DC | PRN

## 2018-08-04 MED ORDER — VALACYCLOVIR HCL 500 MG PO TABS
500.0000 mg | ORAL_TABLET | Freq: Two times a day (BID) | ORAL | Status: DC
  Filled 2018-08-04 (×2): qty 1

## 2018-08-04 NOTE — Progress Notes (Signed)
PHARMACY NOTE:  ANTIMICROBIAL RENAL DOSAGE ADJUSTMENT  Current antimicrobial regimen includes a mismatch between antimicrobial dosage and estimated renal function.  As per policy approved by the Pharmacy & Therapeutics and Medical Executive Committees, the antimicrobial dosage will be adjusted accordingly.  Current antimicrobial dosage:  Unasyn 3g IV every 6 hours  Indication: Aspiration Pneumonia   Renal Function: Estimated Creatinine Clearance: 17.8 mL/min (A) (by C-G formula based on SCr of 1.59 mg/dL (H)).  Antimicrobial dosage has been changed to:  Unasyn 3g IV every 12 hours   Thank you for allowing pharmacy to be a part of this patient's care.  Pernell Dupre, PharmD, BCPS Clinical Pharmacist 08/04/2018 3:09 AM

## 2018-08-04 NOTE — Consult Note (Signed)
[place] Nurse wound consult note Patient receiving care in [facility] 257.  The consult was completed remotely via telephone conversation with her primary RN, Illene Bolus. Reason for Consult: "generalized decubitus ulcer" Wound type: Pressure injuries Pressure Injury POA: Yes Measurement: to be entered by primary RN into flowsheet area of EMR The patient has a middle-upper spine wound looks like a "popped blister" per bedside RN.  Above this wound in a purple area.  Then from the middle spine area to approximately the level of the hip there are discolored areas that are black and white, without bone exposed.  There is a blackened area above the sacrum that is open.  The right heel has a blackened area that is not open.  The right medial foot has a black area.  The left heel has a black blister that is not open.  And, there is a skin tear along the right shin.  Treatments for these areas include:  For all wounds on the back and spine areas, cleanse with saline. Pat dry. Cover with Xeroform gauzes Kellie Simmering [phone]), then foam dressings.  Change the Xeroform gauzes daily; change the foam dressings every 3 days and prn.  For all wounds on the feet, apply iodine from the iodine swabsticks in clean utility.  Allow to air dry.  Wrap the feet in kerlex.  For all skin tears, apply a Vaseline gauze Kellie Simmering #239), wrap in kerlex.  Change daily.  Additional care interventions:  A low air loss mattress; padding bony prominences, and bilateral prevalon boots.  Monitor the wound area(s) for worsening of condition such as: Signs/symptoms of infection,  Increase in size,  Development of or worsening of odor, Development of pain, or increased pain at the affected locations.  Notify the medical team if any of these develop.  Thank you for the consult.  Discussed plan of care with the bedside nurse Berenice.  [place] nurse will not follow at this time.  Please re-consult the [facility] team if needed.  Val Riles, RN, MSN, CWOCN, CNS-BC,  pager [phone]

## 2018-08-04 NOTE — Consult Note (Signed)
[place] Nurse wound consult note Patient receiving care in [facility] 257. I have placed a request via Secure Chat to Dr. Argie Ramming requesting photos of the wound areas of concern ("generalized decubitus ulcer") to be placed in the EMR.   Val Riles, RN, MSN, CWOCN, CNS-BC, pager [phone]

## 2018-08-04 NOTE — Progress Notes (Signed)
Patient admitted this morning with severe hypernatremia and hyperchloremia likely secondary to volume depletion and dehydration. She is also noted to have AKI with leukocytosis. Patient seen at the bedside during morning rounds, she is minimally responsive and appear to be grunting with rattling breath sounds. Overall prognosis poor. Patient's son updated on her condition. He has agreed to come in with permission to visit and make further decision on plan of care. Palliative team consulted to help guide with decision making.

## 2018-08-04 NOTE — ED Notes (Signed)
ED TO INPATIENT HANDOFF REPORT  ED Nurse Name and Phone #:   Gershon Mussel RN   [phone]  S Name/Age/Gender Rita Vang 85 y.o. female Room/Bed: ED06A/ED06A  Code Status   Code Status: Partial Code  Home/SNF/Other Nursing Home {Patient oriented to: none  Is this baseline? no  Triage Complete: Triage complete  Chief Complaint dehydrated  Triage Note Pt arrived via EMS from [facility] due to previously drawn lab work showed pt was dehydrated. Per staff, pt takes in limited oral intake and has limited output. Pt has DNR and is under Hospice care. Pt is not A&O. Pt has multiple areas to posterior of breakdown with facility bandages in place. Pt is on 4L O2 on arrival.    Allergies Allergies  Allergen Reactions  . Sulfamethoxazole-Trimethoprim Other (See Comments)    Chest pain  . Oxycontin [Oxycodone Hcl] Other (See Comments)    Hallucinations   . Clarithromycin Other (See Comments)    Abdominal pain     Level of Care/Admitting Diagnosis ED Disposition    ED Disposition Condition [facility] Area: [place] [100120]  Level of Care: Med-Surg [16]  Covid Evaluation: Confirmed COVID Negative  Diagnosis: Hypernatremia [881103]  Admitting Physician: Christel Mormon [[id number]]  Attending Physician: Christel Mormon [[id number]]  Estimated length of stay: past midnight tomorrow  Certification:: I certify this patient will need inpatient services for at least 2 midnights  PT Class (Do Not Modify): Inpatient [101]  PT Acc Code (Do Not Modify): Private [1]       B Medical/Surgery History Past Medical History:  Diagnosis Date  . Anxiety   . Anxiety disorder    unspecified  . Cerebral infarction ([place])   . CVA (cerebral vascular accident) ([place])   . Dementia ([place])   . Depression   . GERD (gastroesophageal reflux disease)   . GI bleed   . History of recurrent UTIs   . HLD (hyperlipidemia)   . HTN (hypertension)   . Vertigo    syncope    Past Surgical History:  Procedure Laterality Date  . CARPAL TUNNEL RELEASE Right 1995   endoscopic   . CARPAL TUNNEL RELEASE Left 04/30/2013   endoscopic , Dr. Rudene Christians   . CATARACT EXTRACTION Right   . CHOLECYSTECTOMY    . ELBOW SURGERY Left   . ERCP W/ SPHINCTEROTOMY AND BALLOON DILATION  2008   and endoscopy  . ESOPHAGOGASTRODUODENOSCOPY (EGD) WITH PROPOFOL N/A 05/01/2017   Procedure: ESOPHAGOGASTRODUODENOSCOPY (EGD) WITH PROPOFOL;  Surgeon: Virgel Manifold, MD;  Location: [facility] ENDOSCOPY;  Service: Endoscopy;  Laterality: N/A;  . ORIF DISTAL RADIUS FRACTURE Right 2002  . TOTAL KNEE ARTHROPLASTY Left 11/23/2007  . TRIGGER FINGER RELEASE  04/30/2013   third digit, incision tendon sheath for trigger finger     A IV Location/Drains/Wounds Patient Lines/Drains/Airways Status   Active Line/Drains/Airways    Name:   Placement date:   Placement time:   Site:   Days:   Peripheral IV 07/04/18 Anterior;Left Forearm   07/04/18    1510    Forearm   31   External Urinary Catheter   -    -    -      External Urinary Catheter   07/01/18    1600    -   34   Pressure Injury 03/06/17 Stage II -  Partial thickness loss of dermis presenting as a shallow open ulcer with a red, pink wound bed without slough.  03/06/17    2326     516   Pressure Injury 06/17/17 Stage II -  Partial thickness loss of dermis presenting as a shallow open ulcer with a red, pink wound bed without slough.   06/17/17    1850     413   Pressure Injury 01/29/18 Stage I -  Intact skin with non-blanchable redness of a localized area usually over a bony prominence.   01/29/18    1429     187   Pressure Injury 01/31/18 Stage I -  Intact skin with non-blanchable redness of a localized area usually over a bony prominence.   01/31/18    0807     185          Intake/Output Last 24 hours  Intake/Output Summary (Last 24 hours) at 08/04/2018 0051 Last data filed at 08/06/2018 2324 Gross per 24 hour  Intake 599.84 ml  Output -   Net 599.84 ml    Labs/Imaging Results for orders placed or performed during the hospital encounter of 07/24/2018 (from the past 48 hour(s))  Comprehensive metabolic panel     Status: Abnormal   Collection Time: 07/28/2018  9:18 PM  Result Value Ref Range   Sodium 174 (HH) 135 - 145 mmol/L    Comment: CRITICAL RESULT CALLED TO, READ BACK BY AND VERIFIED WITH TOM NAGGY @2210  07/30/2018 MJU    Potassium 4.2 3.5 - 5.1 mmol/L   Chloride 130 (H) 98 - 111 mmol/L   CO2 38 (H) 22 - 32 mmol/L   Glucose, Bld 112 (H) 70 - 99 mg/dL   BUN 73 (H) 8 - 23 mg/dL   Creatinine, Ser 1.611.59 (H) 0.44 - 1.00 mg/dL   Calcium 8.4 (L) 8.9 - 10.3 mg/dL   Total Protein 6.0 (L) 6.5 - 8.1 g/dL   Albumin 2.3 (L) 3.5 - 5.0 g/dL   AST 25 15 - 41 U/L   ALT 15 0 - 44 U/L   Alkaline Phosphatase 90 38 - 126 U/L   Total Bilirubin 0.6 0.3 - 1.2 mg/dL   GFR calc non Af Amer 28 (L) >60 mL/min   GFR calc Af Amer 33 (L) >60 mL/min   Anion gap 6 5 - 15    Comment: Performed at [facility], [address] [place], [place] [address]  CBC with Differential     Status: Abnormal   Collection Time: 07/22/2018  9:18 PM  Result Value Ref Range   WBC 24.4 (H) 4.0 - 10.5 K/uL   RBC 3.52 (L) 3.87 - 5.11 MIL/uL   Hemoglobin 8.2 (L) 12.0 - 15.0 g/dL   HCT 54.034.4 (L) 98.136.0 - 19.146.0 %   MCV 97.7 80.0 - 100.0 fL   MCH 23.3 (L) 26.0 - 34.0 pg   MCHC 23.8 (L) 30.0 - 36.0 g/dL   RDW 47.820.2 (H) 29.511.5 - 62.115.5 %   Platelets 192 150 - 400 K/uL   nRBC 0.2 0.0 - 0.2 %   Neutrophils Relative % 83 %   Neutro Abs 20.2 (H) 1.7 - 7.7 K/uL   Lymphocytes Relative 9 %   Lymphs Abs 2.2 0.7 - 4.0 K/uL   Monocytes Relative 5 %   Monocytes Absolute 1.1 (H) 0.1 - 1.0 K/uL   Eosinophils Relative 0 %   Eosinophils Absolute 0.0 0.0 - 0.5 K/uL   Basophils Relative 0 %   Basophils Absolute 0.1 0.0 - 0.1 K/uL   Immature Granulocytes 3 %   Abs Immature Granulocytes 0.70 (H)  0.00 - 0.07 K/uL    Comment: Performed at [facility], [address] [place], [place] [address]  Urinalysis, Complete w Microscopic     Status: Abnormal   Collection Time: 2018/08/31  9:18 PM  Result Value Ref Range   Color, Urine YELLOW (A) YELLOW   APPearance CLOUDY (A) CLEAR   Specific Gravity, Urine 1.018 1.005 - 1.030   pH 5.0 5.0 - 8.0   Glucose, UA NEGATIVE NEGATIVE mg/dL   Hgb urine dipstick NEGATIVE NEGATIVE   Bilirubin Urine NEGATIVE NEGATIVE   Ketones, ur NEGATIVE NEGATIVE mg/dL   Protein, ur NEGATIVE NEGATIVE mg/dL   Nitrite NEGATIVE NEGATIVE   Leukocytes,Ua NEGATIVE NEGATIVE   RBC / HPF 6-10 0 - 5 RBC/hpf   WBC, UA 0-5 0 - 5 WBC/hpf   Bacteria, UA RARE (A) NONE SEEN   Squamous Epithelial / LPF 0-5 0 - 5   Mucus PRESENT    Hyaline Casts, UA PRESENT    Granular Casts, UA PRESENT     Comment: Performed at [facility], [address], [place], [place] [address]  SARS Coronavirus 2 (CEPHEID - Performed in [facility] hospital lab), Hosp Order     Status: None   Collection Time: 2018-08-31  9:20 PM   Specimen: Nasopharyngeal Swab  Result Value Ref Range   SARS Coronavirus 2 NEGATIVE NEGATIVE    Comment: (NOTE) If result is NEGATIVE SARS-CoV-2 target nucleic acids are NOT DETECTED. The SARS-CoV-2 RNA is generally detectable in upper and lower  respiratory specimens during the acute phase of infection. The lowest  concentration of SARS-CoV-2 viral copies this assay can detect is 250  copies / mL. A negative result does not preclude SARS-CoV-2 infection  and should not be used as the sole basis for treatment or other  patient management decisions.  A negative result may occur with  improper specimen collection / handling, submission of specimen other  than nasopharyngeal swab, presence of viral mutation(s) within the  areas targeted by this assay, and inadequate number of viral copies  (<250 copies / mL). A negative result must be combined with clinical  observations, patient history, and epidemiological information. If result is  POSITIVE SARS-CoV-2 target nucleic acids are DETECTED. The SARS-CoV-2 RNA is generally detectable in upper and lower  respiratory specimens dur ing the acute phase of infection.  Positive  results are indicative of active infection with SARS-CoV-2.  Clinical  correlation with patient history and other diagnostic information is  necessary to determine patient infection status.  Positive results do  not rule out bacterial infection or co-infection with other viruses. If result is PRESUMPTIVE POSTIVE SARS-CoV-2 nucleic acids MAY BE PRESENT.   A presumptive positive result was obtained on the submitted specimen  and confirmed on repeat testing.  While 2019 novel coronavirus  (SARS-CoV-2) nucleic acids may be present in the submitted sample  additional confirmatory testing may be necessary for epidemiological  and / or clinical management purposes  to differentiate between  SARS-CoV-2 and other Sarbecovirus currently known to infect humans.  If clinically indicated additional testing with an alternate test  methodology [phone]) is advised. The SARS-CoV-2 RNA is generally  detectable in upper and lower respiratory sp ecimens during the acute  phase of infection. The expected result is Negative. Fact Sheet for Patients:  BoilerBrush.com.cy Fact Sheet for Healthcare Providers: https://pope.com/ This test is not yet approved or cleared by the [place] FDA and has been authorized for detection and/or diagnosis of SARS-CoV-2  by FDA under an Emergency Use Authorization (EUA).  This EUA will remain in effect (meaning this test can be used) for the duration of the COVID-19 declaration under Section 564(b)(1) of the Act, 21 U.S.C. section 360bbb-3(b)(1), unless the authorization is terminated or revoked sooner. Performed at [facility], [address] [place], [place] [address]   Lactic acid, plasma     Status: None   Collection  Time: 06-03-18 10:48 PM  Result Value Ref Range   Lactic Acid, Venous 1.8 0.5 - 1.9 mmol/L    Comment: Performed at [facility], [address] [place], [place] [address]   Dg Chest Port 1 View  Result Date: 09-03-18 CLINICAL DATA:  Weakness and dehydration EXAM: PORTABLE CHEST 1 VIEW COMPARISON:  07/03/2018 FINDINGS: Cardiac shadow remains enlarged. Lungs are well aerated bilaterally. Vague density is noted over the midportion of the right lung likely related to fluid trapped within the major fissure. Mild vascular congestion is noted. Mild patchy infiltrates are noted bilaterally. Old rib fractures are seen. IMPRESSION: Likely fluid trapped within the major fissure on the right. Vascular congestion with some patchy infiltrates similar to those seen on prior exams. Electronically Signed   By: Alcide CleverMark  Lukens M.D.   On: 06-03-2018 20:58    Pending Labs Unresulted Labs (From admission, onward)    Start     Ordered   08/04/18 0500  Basic metabolic panel  Tomorrow morning,   STAT     08/04/18 0020   08/04/18 0500  CBC  Tomorrow morning,   STAT     08/04/18 0020   06-03-18 2206  Lactic acid, plasma  Now then every 2 hours,   STAT     06-03-18 2205   06-03-18 2031  Culture, blood (routine x 2)  BLOOD CULTURE X 2,   STAT     06-03-18 2032   06-03-18 2031  Urine culture  Once,   STAT     06-03-18 2032          Vitals/Pain Today's Vitals   06-03-18 2330 08/04/18 0000 08/04/18 0030 08/04/18 0048  BP: 113/81 108/70 (!) 92/52   Pulse:      Resp: (!) 23 (!) 21 (!) 21   Temp:      TempSrc:      SpO2:      Weight:    49 kg  Height:        Isolation Precautions No active isolations  Medications Medications  metroNIDAZOLE (FLAGYL) IVPB 500 mg (has no administration in time range)  vancomycin (VANCOCIN) IVPB 1000 mg/200 mL premix (1,000 mg Intravenous New Bag/Given 08/04/18 0050)  0.9 %  sodium chloride infusion (has no administration in time range)  aspirin EC tablet 81 mg  (has no administration in time range)  traMADol (ULTRAM) tablet 50 mg (has no administration in time range)  valACYclovir (VALTREX) tablet 500 mg (has no administration in time range)  metoprolol tartrate (LOPRESSOR) tablet 25 mg (has no administration in time range)  simvastatin (ZOCOR) tablet 40 mg (has no administration in time range)  citalopram (CELEXA) tablet 20 mg (has no administration in time range)  donepezil (ARICEPT) tablet 10 mg (has no administration in time range)  galantamine (RAZADYNE) tablet 4 mg (has no administration in time range)  LORazepam (ATIVAN) tablet 0.5 mg (has no administration in time range)  QUEtiapine (SEROQUEL) tablet 50 mg (has no administration in time range)  alum & mag hydroxide-simeth (MAALOX/MYLANTA) 200-200-20 MG/5ML suspension 30 mL (has no  administration in time range)  hyoscyamine (LEVSIN) 0.125 MG/5ML elixir 0.125 mg (has no administration in time range)  loperamide (IMODIUM) capsule 2 mg (has no administration in time range)  magnesium hydroxide (MILK OF MAGNESIA) suspension 30 mL (has no administration in time range)  magnesium oxide (MAG-OX) tablet 400 mg (has no administration in time range)  senna-docusate (Senokot-S) tablet 1 tablet (has no administration in time range)  feeding supplement (PRO-STAT SUGAR FREE 64) liquid 30 mL (has no administration in time range)  multivitamin with minerals tablet 1 tablet (has no administration in time range)  vitamin C (ASCORBIC ACID) tablet 250 mg (has no administration in time range)  zinc sulfate capsule 220 mg (has no administration in time range)  albuterol (PROVENTIL) (2.5 MG/3ML) 0.083% nebulizer solution 3 mL (has no administration in time range)  fluticasone (FLONASE) 50 MCG/ACT nasal spray 1 spray (has no administration in time range)  guaiFENesin (ROBITUSSIN) 100 MG/5ML liquid 200 mg (has no administration in time range)  ipratropium (ATROVENT) nebulizer solution 0.5 mg (has no administration in  time range)  hydroxypropyl methylcellulose / hypromellose (ISOPTO TEARS / GONIOVISC) 2.5 % ophthalmic solution 1 drop (has no administration in time range)  enoxaparin (LOVENOX) injection 40 mg (has no administration in time range)  dextrose 5 % solution (has no administration in time range)  acetaminophen (TYLENOL) tablet 650 mg (has no administration in time range)    Or  acetaminophen (TYLENOL) suppository 650 mg (has no administration in time range)  traZODone (DESYREL) tablet 25 mg (has no administration in time range)  magnesium hydroxide (MILK OF MAGNESIA) suspension 30 mL (has no administration in time range)  ondansetron (ZOFRAN) tablet 4 mg (has no administration in time range)    Or  ondansetron (ZOFRAN) injection 4 mg (has no administration in time range)  sodium chloride 0.9 % bolus 500 mL (0 mLs Intravenous Stopped 12/24/2018 2248)  ceFEPIme (MAXIPIME) 2 g in sodium chloride 0.9 % 100 mL IVPB (0 g Intravenous Stopped 12/24/2018 2324)    Mobility non-ambulatory High fall risk   Focused Assessments Cardiac Assessment Handoff:    Lab Results  Component Value Date   CKTOTAL 649 (H) 11/19/2012   CKMB 10.3 (H) 11/19/2012   TROPONINI 0.03 (HH) 07/01/2018   No results found for: DDIMER Does the Patient currently have chest pain? No     R Recommendations: See Admitting Provider Note  Report given to:   Additional Notes:

## 2018-08-04 NOTE — H&P (Addendum)
[facility] [place] [facility]   PATIENT NAME: Rita Vang    MR#:  [id number]  DATE OF BIRTH:  02/28/1924  DATE OF ADMISSION: 08/04/2018  PRIMARY CARE PHYSICIAN: Gracelyn NurseJohnston, John D, MD   REQUESTING/REFERRING PHYSICIAN: Ileana RoupMcShane, James, MD CHIEF COMPLAINT:   Chief Complaint  Patient presents with   Failure To Thrive   Dehydration    HISTORY OF PRESENT ILLNESS:  Rita Vang  is a 85 y.o. Caucasian female with a known history of multiple medical problems that are mentioned below, who presented to the emergency room with acute onset of failure to thrive with decreased responsiveness.  She has been having decreased p.o. intake and there was a concern about dehydration.  The patient was alert to her name but otherwise fairly somnolent and nonverbal and therefore no history could be obtained from her.  Her son who is her power of attorney was contacted earlier and stated that the patient is DNI but can be resuscitated.  Few weeks ago the patient was verbal and stated that she wanted to keep going.  She is on hospice though but for palliative care.  She had leukocytosis apparently a couple of days ago.  When she came to the ER, blood pressure was 94/59 and later 88/49 with respiratory to 23 and otherwise normal vital signs.  Blood pressure with hydration came up to 108/70 and her latest was 105/66 with a heart rate of 118 and respiratory to 23.  Labs are remarkable for severe hyponatremia with a sodium of 174 and hypochloremia of 130 with a CO2 38, BUN of 73 and creatinine of 1.59 compared to 36 and 1.2 on 07/03/2018 which is about a month ago.  GFR is 28 down from 39 then.  CBC showed leukocytosis of 24.4 compared to 16.1 then with anemia at her baseline.  Urinalysis was unremarkable and COVID-19 test came back negative.  Portable chest x-ray showed likely fluid trapped within the major fissure on the right with vascular congestion and some patchy infiltrates that are  similar to those seen on prior exam and actually looking better on the right lung base per my review.  Patient was not reported to have any worsening dyspnea though.  She was given 500 mill of IV normal saline in the ER followed by 100 mL/h, IV cefepime, Flagyl and vancomycin.  She will be admitted to the medical monitored bed for further evaluation and management.  Past Medical History:  Diagnosis Date   Anxiety    Anxiety disorder    unspecified   Cerebral infarction [facility](HCC)    CVA (cerebral vascular accident) (HCC)    Dementia (HCC)    Depression    GERD (gastroesophageal reflux disease)    GI bleed    History of recurrent UTIs    HLD (hyperlipidemia)    HTN (hypertension)    Vertigo    syncope    PAST SURGICAL HISTORY:   Past Surgical History:  Procedure Laterality Date   CARPAL TUNNEL RELEASE Right 1995   endoscopic    CARPAL TUNNEL RELEASE Left 04/30/2013   endoscopic , Dr. Rosita KeaMenz    CATARACT EXTRACTION Right    CHOLECYSTECTOMY     ELBOW SURGERY Left    ERCP W/ SPHINCTEROTOMY AND BALLOON DILATION  2008   and endoscopy   ESOPHAGOGASTRODUODENOSCOPY (EGD) WITH PROPOFOL N/A 05/01/2017   Procedure: ESOPHAGOGASTRODUODENOSCOPY (EGD) WITH PROPOFOL;  Surgeon: Pasty Spillersahiliani, Varnita B, MD;  Location: [facility] ENDOSCOPY;  Service: Endoscopy;  Laterality: N/A;  ORIF DISTAL RADIUS FRACTURE Right 2002   TOTAL KNEE ARTHROPLASTY Left 11/23/2007   TRIGGER FINGER RELEASE  04/30/2013   third digit, incision tendon sheath for trigger finger    SOCIAL HISTORY:   Social History   Tobacco Use   Smoking status: Never Smoker   Smokeless tobacco: Never Used   Tobacco comment: quit at age [age]  Substance Use Topics   Alcohol use: Never    Frequency: Never    FAMILY HISTORY:   Family History  Problem Relation Age of Onset   Heart attack Father    Alzheimer's disease Mother     DRUG ALLERGIES:   Allergies  Allergen Reactions   Sulfamethoxazole-Trimethoprim  Other (See Comments)    Chest pain   Oxycontin [Oxycodone Hcl] Other (See Comments)    Hallucinations    Clarithromycin Other (See Comments)    Abdominal pain     REVIEW OF SYSTEMS:   ROS As per history of present illness. All pertinent systems were reviewed above. Constitutional,  HEENT, cardiovascular, respiratory, GI, GU, musculoskeletal, neuro, psychiatric, endocrine,  integumentary and hematologic systems were reviewed and are otherwise  negative/unremarkable except for positive findings mentioned above in the HPI.   MEDICATIONS AT HOME:   Prior to Admission medications   Medication Sig Start Date End Date Taking? Authorizing Provider  aspirin EC 81 MG tablet Take 81 mg by mouth daily.   Yes [provider]  citalopram (CELEXA) 20 MG tablet Take 1 tablet by mouth daily.  12/30/13  Yes [provider]  donepezil (ARICEPT) 10 MG tablet Take 1 tablet by mouth at bedtime.  02/02/14  Yes [provider]  fluticasone (FLONASE) 50 MCG/ACT nasal spray Place 1 spray into both nostrils daily.  02/24/14  Yes [provider]  galantamine (RAZADYNE) 4 MG tablet Take 4 mg by mouth 2 (two) times daily with a meal.   Yes [provider]  ipratropium (ATROVENT HFA) 17 MCG/ACT inhaler Inhale 2 puffs into the lungs 3 (three) times daily as needed for wheezing.    Yes [provider]  LORazepam (ATIVAN) 0.5 MG tablet Take 0.5 mg by mouth every 4 (four) hours as needed for anxiety.   Yes [provider]  magnesium oxide (MAG-OX) 400 MG tablet Take 400 mg by mouth daily.   Yes [provider]  metoprolol tartrate (LOPRESSOR) 25 MG tablet Take 1 tablet (25 mg total) by mouth 2 (two) times daily. 05/07/17  Yes Altamese DillingVachhani, Vaibhavkumar, MD  Multiple Vitamin (MULTIVITAMIN WITH MINERALS) TABS tablet Take 1 tablet by mouth daily.   Yes [provider]  QUEtiapine (SEROQUEL) 50 MG tablet Take 50 mg by mouth at bedtime.    Yes  [provider]  simvastatin (ZOCOR) 40 MG tablet Take 40 mg by mouth every evening.  04/22/17  Yes [provider]  traMADol (ULTRAM) 50 MG tablet Take 1 tablet (50 mg total) by mouth every 12 (twelve) hours as needed for moderate pain. 07/06/18  Yes Enedina FinnerPatel, Sona, MD  vitamin C (ASCORBIC ACID) 250 MG tablet Take 250 mg by mouth 2 (two) times daily.   Yes [provider]  zinc sulfate 220 (50 Zn) MG capsule Take 220 mg by mouth daily.   Yes [provider]  acetaminophen (TYLENOL) 500 MG tablet Take 500 mg by mouth every 6 (six) hours as needed for mild pain or fever.    [provider]  albuterol (VENTOLIN HFA) 108 (90 Base) MCG/ACT inhaler Inhale 2 puffs into  the lungs every 6 (six) hours as needed for wheezing or shortness of breath.    [provider]  alum & mag hydroxide-simeth (MI-ACID) 200-200-20 MG/5ML suspension Take 30 mLs by mouth every 6 (six) hours as needed for indigestion or heartburn.    [provider]  Amino Acids-Protein Hydrolys (FEEDING SUPPLEMENT, PRO-STAT SUGAR FREE 64,) LIQD Take 30 mLs by mouth daily.    [provider]  guaiFENesin (ROBITUSSIN) 100 MG/5ML liquid Take 200 mg by mouth every 6 (six) hours as needed for cough.    [provider]  hydroxypropyl methylcellulose / hypromellose (ISOPTO TEARS / GONIOVISC) 2.5 % ophthalmic solution Place 1 drop into both eyes 4 (four) times daily as needed for dry eyes.    [provider]  hyoscyamine (LEVSIN) 0.125 MG/5ML ELIX Take 0.125 mg by mouth every 2 (two) hours as needed (increased secretions).    [provider]  loperamide (IMODIUM) 2 MG capsule Take 2 mg by mouth as needed for diarrhea or loose stools.    [provider]  magnesium hydroxide (MILK OF MAGNESIA) 400 MG/5ML suspension Take 30 mLs by mouth at bedtime as needed for mild constipation.    [provider]  pantoprazole (PROTONIX) 40 MG tablet Take 1  tablet (40 mg total) by mouth daily for 30 days. 01/31/18 07/01/18  Saundra Shelling, MD  sennosides-docusate sodium (SENOKOT-S) 8.6-50 MG tablet Take 1 tablet by mouth 2 (two) times daily.     [provider]  valACYclovir (VALTREX) 500 MG tablet Take 500 mg by mouth 2 (two) times daily.    [provider]      VITAL SIGNS:  Blood pressure (!) 91/59, pulse (!) 101, temperature 97.6 F (36.4 C), temperature source Rectal, resp. rate 16, height 5\' 5"  (1.651 m), SpO2 100 %.  PHYSICAL EXAMINATION:  Physical Exam  GENERAL:  85 y.o.-year-old Caucasian female patient lying in the bed with no acute distress.  EYES: Pupils equal, round, reactive to light and accommodation. No scleral icterus. Extraocular muscles intact.  HEENT: Head atraumatic, normocephalic. Oropharynx with dry mucous membrane and tongue and nasopharynx clear.  NECK:  Supple, no jugular venous distention. No thyroid enlargement, no tenderness.  LUNGS: Normal breath sounds bilaterally, no wheezing, rales,rhonchi or crepitation. No use of accessory muscles of respiration.  CARDIOVASCULAR: Regular rate and rhythm, S1, S2 normal. No murmurs, rubs, or gallops.  ABDOMEN: Soft, nondistended, nontender. Bowel sounds present. No organomegaly or mass.  EXTREMITIES: No pedal edema, cyanosis, or clubbing.  NEUROLOGIC: Cranial nerves II through XII are intact. Muscle strength 5/5 in all extremities. Sensation intact. Gait not checked.  PSYCHIATRIC: The patient is alert and oriented x 3.  Normal affect and good eye contact. SKIN: Patient has back and feet decubitus ulcers   LABORATORY PANEL:   CBC Recent Labs  Lab 07/19/2018 2118  WBC 24.4*  HGB 8.2*  HCT 34.4*  PLT 192   ------------------------------------------------------------------------------------------------------------------  Chemistries  Recent Labs  Lab 07/16/2018 2118  NA 174*  K 4.2  CL 130*  CO2 38*  GLUCOSE 112*  BUN 73*  CREATININE 1.59*    CALCIUM 8.4*  AST 25  ALT 15  ALKPHOS 90  BILITOT 0.6   ------------------------------------------------------------------------------------------------------------------  Cardiac Enzymes No results for input(s): TROPONINI in the last 168 hours. ------------------------------------------------------------------------------------------------------------------  RADIOLOGY:  Dg Chest Port 1 View  Result Date: 08/07/2018 CLINICAL DATA:  Weakness and dehydration EXAM: PORTABLE CHEST 1 VIEW COMPARISON:  07/03/2018 FINDINGS: Cardiac shadow remains enlarged. Lungs are well  aerated bilaterally. Vague density is noted over the midportion of the right lung likely related to fluid trapped within the major fissure. Mild vascular congestion is noted. Mild patchy infiltrates are noted bilaterally. Old rib fractures are seen. IMPRESSION: Likely fluid trapped within the major fissure on the right. Vascular congestion with some patchy infiltrates similar to those seen on prior exams. Electronically Signed   By: Alcide CleverMark  Lukens M.D.   On: July 13, 2018 20:58      IMPRESSION AND PLAN:   1.  Severe hypernatremia and hyperchloremia likely secondary to volume depletion and dehydration. -The patient will be admitted to medical monitored bed.  At this time I will place her on D5W at 100ml per hour and follow her sodium level.  She has already received a bolus of IV normal saline.  2.  Acute kidney injury.  - She will be hydrated as mentioned above and will follow her BUN and creatinine levels. -Will avoid nephrotoxic medications. - We will maintain her blood pressure and avoid hypotension.  Should she need fluid bolus will utilize 250 mL half-normal saline.  3.  Metabolic encephalopathy. -This likely secondary to #1 and #2.  Management as above. -We will follow neuro checks every 4 hours for 24 hours.  4.  Leukocytosis. -Given her abnormal chest x-ray though it could be not much different from before, one would  be concerned about aspiration.  In the setting of mild hypotension and tachypnea, she could be septic. -I will therefore place her on IV Unasyn and Zithromax for the possibility of underlying aspiration pneumonia. -Will follow blood cultures and obtain sputum culture if possible.  5.  Dementia. -Aricept and Razadyne will be continued. -Seroquel will be given for behavioral changes with her dementia. -Celexa and PRN Ativan will be given for depression and anxiety.  6.  DVT prophylaxis.  Subcutaneous heparin.   All the records are reviewed and case discussed with ED provider. The plan of care was discussed in details with the patient's son earlier and he agreed  to proceed with the above mentioned plan. Further management will depend upon hospital course.   CODE STATUS: Full code  TOTAL TIME TAKING CARE OF THIS PATIENT: 50 minutes.    Hannah BeatJan A Pricila Bridge M.D on 08/04/2018 at 12:21 AM  Pager - [phone]  After 6pm go to [email or web address] - Social research officer, governmentpassword EPAS [facility]  [facility] [place] [facility]  Office  [phone]  CC: Primary care physician; Gracelyn NurseJohnston, John D, MD   Note: This dictation was prepared with Dragon dictation along with smaller phrase technology. Any transcriptional errors that result from this process are unintentional.

## 2018-08-04 NOTE — Progress Notes (Signed)
MD notified of critical lactic acid and critical sodium level.

## 2018-08-04 NOTE — Consult Note (Signed)
Consultation Note Date: 08/04/2018   Patient Name: Rita Vang  DOB: [birth year]  MRN: [id number]  Age / Sex: 85 y.o., female  PCP: Baxter Hire, MD Referring Physician: Lang Snow,*  Reason for Consultation: Establishing goals of care  HPI/Patient Profile: 85 y/o presented to the emergency room with acute onset of failure to thrive with decreased responsiveness.  She has been having decreased p.o. intake and there was a concern about dehydration.  Clinical Assessment and Goals of Care: [name] resting in bed on her side. Son who is POA and daughter are at bedside. She does not respond to my presence. The children discuss how she has declined since being unable to have visitors at her facility. They discuss her previous hospitalizations.  She has not known her children's names for some time.   We discussed her diagnosis, prognosis, GOC, EOL wishes disposition and options.  A detailed discussion was had today regarding advanced directives.  Concepts specific to code status, artifical feeding and hydration, IV antibiotics and rehospitalization were discussed.  The difference between an aggressive medical intervention path and a comfort care path was discussed.  Values and goals of care important to patient and family were attempted to be elicited.   Son states he is frustrated because he indicated his mother would want intubation, but not CPR as she is too old for that. The children state they "are on different pages"  as [name]'s daughter would like to focus on comfort until end of life, and son wants to continue to treat the treatable. Discussed limitations of medical interventions to prolong quality of life in some situations and discussed the concept of human mortality. [name] is a woman of faith reading the Bible and other [place] books routinely. Daughter states her mother knows  where she is going after this life and would not want to suffer.   Attending MD in to bedside. Continued discussion of her medical issues and trajectory, pain, suffering, and QOL. Son states he would like to shift the focus to comfort care and transition her to hospice facility. Natural trajectory and expectations at EOL were discussed.      I completed a MOST form today which was signed by son who is HPOA in the presence of patient's daughter, and original was placed in the chart. A photocopy was placed in the chart to be scanned into EMR. The patient outlined their wishes for the following treatment decisions:  Cardiopulmonary Resuscitation: Do Not Attempt Resuscitation (DNR/No CPR)  Medical Interventions: Comfort Measures: Keep clean, warm, and dry. Use medication by any route, positioning, wound care, and other measures to relieve pain and suffering. Use oxygen, suction and manual treatment of airway obstruction as needed for comfort. Do not transfer to the hospital unless comfort needs cannot be met in current location.  Antibiotics: No antibiotics (use other measures to relieve symptoms)  IV Fluids: No IV fluids (provide other measures to ensure comfort)  Feeding Tube: No feeding tube      SUMMARY  OF RECOMMENDATIONS   Transition to comfort care. Transfer to hospice facility.   Code Status/Advance Care Planning:  DNR    Symptom Management:   Morphine for pain  Ativan for anxiety  Haldol for agitation  Robinul for excessive secretions.   Palliative Prophylaxis:   Oral Care and Palliative Wound Care  Additional Recommendations (Limitations, Scope, Preferences):  Full Comfort Care   Prognosis:   < 2 weeks Hypernatremia, Elevated creatinine, dementia, no PO intake, decubitus ulcer.   Discharge Planning: Hospice facility      Primary Diagnoses: Present on Admission: . Hypernatremia . Hyponatremia   I have reviewed the medical record, interviewed the patient and  family, and examined the patient. The following aspects are pertinent.  Past Medical History:  Diagnosis Date  . Anxiety   . Anxiety disorder    unspecified  . Cerebral infarction ([place])   . CVA (cerebral vascular accident) ([place])   . Dementia ([place])   . Depression   . GERD (gastroesophageal reflux disease)   . GI bleed   . History of recurrent UTIs   . HLD (hyperlipidemia)   . HTN (hypertension)   . Vertigo    syncope   Social History   Socioeconomic History  . Marital status: Widowed    Spouse name: Not on file  . Number of children: 4  . Years of education: 61  . Highest education level: Associate degree: occupational, Hotel manager, or vocational program  Occupational History  . Occupation: retired  Scientific laboratory technician  . Financial resource strain: Not on file  . Food insecurity    Worry: Not on file    Inability: Not on file  . Transportation needs    Medical: Not on file    Non-medical: Not on file  Tobacco Use  . Smoking status: Never Smoker  . Smokeless tobacco: Never Used  . Tobacco comment: quit at age 71  Substance and Sexual Activity  . Alcohol use: Never    Frequency: Never  . Drug use: Not Currently  . Sexual activity: Not on file  Lifestyle  . Physical activity    Days per week: Not on file    Minutes per session: Not on file  . Stress: Not on file  Relationships  . Social Herbalist on phone: Not on file    Gets together: Not on file    Attends religious service: Not on file    Active member of club or organization: Not on file    Attends meetings of clubs or organizations: Not on file    Relationship status: Not on file  Other Topics Concern  . Not on file  Social History Narrative   ** Merged History Encounter **       Family History  Problem Relation Age of Onset  . Heart attack Father   . Alzheimer's disease Mother    Scheduled Meds: . aspirin EC  81 mg Oral Daily  . citalopram  20 mg Oral Daily  . donepezil  10 mg Oral QHS  .  feeding supplement (PRO-STAT SUGAR FREE 64)  30 mL Oral Daily  . fluticasone  1 spray Each Nare Daily  . galantamine  4 mg Oral BID WC  . heparin injection (subcutaneous)  5,000 Units Subcutaneous Q8H  . magnesium oxide  400 mg Oral Daily  . metoprolol tartrate  25 mg Oral BID  . multivitamin with minerals  1 tablet Oral Daily  . QUEtiapine  50 mg Oral QHS  .  simvastatin  40 mg Oral QPM  . valACYclovir  500 mg Oral BID  . vitamin C  250 mg Oral BID  . zinc sulfate  220 mg Oral Daily   Continuous Infusions: . ampicillin-sulbactam (UNASYN) IV 3 g (08/04/18 7989)  . azithromycin 500 mg (08/04/18 0956)  . dextrose 100 mL/hr at 08/04/18 0515   PRN Meds:.acetaminophen **OR** acetaminophen, albuterol, alum & mag hydroxide-simeth, guaiFENesin, hyoscyamine, ipratropium, loperamide, LORazepam, magnesium hydroxide, magnesium hydroxide, ondansetron **OR** ondansetron (ZOFRAN) IV, polyvinyl alcohol, senna-docusate, traMADol, traZODone Medications Prior to Admission:  Prior to Admission medications   Medication Sig Start Date End Date Taking? Authorizing Provider  acetaminophen (TYLENOL) 500 MG tablet Take 500 mg by mouth every 6 (six) hours as needed for mild pain or fever.   Yes [provider]  albuterol (ACCUNEB) 0.63 MG/3ML nebulizer solution Take 1 ampule by nebulization every 4 (four) hours as needed for wheezing.   Yes [provider]  alum & mag hydroxide-simeth (MI-ACID) 200-200-20 MG/5ML suspension Take 30 mLs by mouth every 6 (six) hours as needed for indigestion or heartburn.   Yes [provider]  Amino Acids-Protein Hydrolys (FEEDING SUPPLEMENT, PRO-STAT SUGAR FREE 64,) LIQD Take 30 mLs by mouth daily.   Yes [provider]  aspirin EC 81 MG tablet Take 81 mg by mouth daily.   Yes [provider]  bisacodyl (FLEET) 10 MG/30ML ENEM Place 10 mg rectally as needed.   Yes [provider]  citalopram (CELEXA) 20 MG tablet Take 1 tablet by  mouth daily.  12/30/13  Yes [provider]  donepezil (ARICEPT) 10 MG tablet Take 1 tablet by mouth at bedtime.  02/02/14  Yes [provider]  fluticasone (FLONASE) 50 MCG/ACT nasal spray Place 1 spray into both nostrils daily.  02/24/14  Yes [provider]  galantamine (RAZADYNE) 4 MG tablet Take 4 mg by mouth 2 (two) times daily with a meal.   Yes [provider]  guaiFENesin (ROBITUSSIN) 100 MG/5ML liquid Take 200 mg by mouth every 6 (six) hours as needed for cough.   Yes [provider]  hydroxypropyl methylcellulose / hypromellose (ISOPTO TEARS / GONIOVISC) 2.5 % ophthalmic solution Place 1 drop into both eyes 4 (four) times daily as needed for dry eyes.   Yes [provider]  hyoscyamine (LEVSIN) 0.125 MG/5ML ELIX Take 0.125 mg by mouth every 2 (two) hours as needed (increased secretions).   Yes [provider]  ipratropium (ATROVENT HFA) 17 MCG/ACT inhaler Inhale 2 puffs into the lungs 3 (three) times daily as needed for wheezing.    Yes [provider]  loperamide (IMODIUM) 2 MG capsule Take 2 mg by mouth as needed for diarrhea or loose stools.   Yes [provider]  LORazepam (ATIVAN) 0.5 MG tablet Place 0.5 mg under the tongue every 4 (four) hours as needed for anxiety.    Yes [provider]  magnesium hydroxide (MILK OF MAGNESIA) 400 MG/5ML suspension Take 30 mLs by mouth at bedtime as needed for mild constipation.   Yes [provider]  magnesium oxide (MAG-OX) 400 MG tablet Take 400 mg by mouth daily.   Yes [provider]  metoprolol tartrate (LOPRESSOR) 25 MG tablet Take 1 tablet (25 mg total) by mouth 2 (two) times daily. 05/07/17  Yes Vaughan Basta, MD  Morphine Sulfate (MORPHINE CONCENTRATE) 10 mg / 0.5 ml concentrated solution Take 10 mg by mouth every 2 (two) hours as needed for severe pain.   Yes  [provider]  Multiple Vitamin (MULTIVITAMIN WITH  MINERALS) TABS tablet Take 1 tablet by mouth daily.   Yes [provider]  neomycin-bacitracin-polymyxin (NEOSPORIN) OINT Apply 1 application topically as needed for wound care.   Yes [provider]  pantoprazole (PROTONIX) 40 MG tablet Take 1 tablet (40 mg total) by mouth daily for 30 days. 01/31/18 08/04/18 Yes Pyreddy, Reatha Harps, MD  QUEtiapine (SEROQUEL) 50 MG tablet Take 50 mg by mouth at bedtime.    Yes [provider]  sennosides-docusate sodium (SENOKOT-S) 8.6-50 MG tablet Take 1 tablet by mouth 2 (two) times daily.    Yes [provider]  simvastatin (ZOCOR) 40 MG tablet Take 40 mg by mouth every evening.  04/22/17  Yes [provider]  traMADol (ULTRAM) 50 MG tablet Take 1 tablet (50 mg total) by mouth every 12 (twelve) hours as needed for moderate pain. 07/06/18  Yes Fritzi Mandes, MD  valACYclovir (VALTREX) 500 MG tablet Take 500 mg by mouth 2 (two) times daily.   Yes [provider]  vitamin C (ASCORBIC ACID) 250 MG tablet Take 500 mg by mouth 2 (two) times daily.    Yes [provider]  zinc sulfate 220 (50 Zn) MG capsule Take 220 mg by mouth daily.   Yes [provider]   Allergies  Allergen Reactions  . Sulfamethoxazole-Trimethoprim Other (See Comments)    Chest pain  . Oxycontin [Oxycodone Hcl] Other (See Comments)    Hallucinations   . Clarithromycin Other (See Comments)    Abdominal pain    Review of Systems  Unable to perform ROS   Physical Exam Constitutional:      Comments: Resting with eyes closed.   Pulmonary:     Comments: Audible rhonchi.  Skin:    General: Skin is warm and dry.     Vital Signs: BP 92/65 (BP Location: Left Arm)   Pulse (!) 107   Temp 97.6 F (36.4 C)   Resp 19   Ht _0  (1.651 m)   Wt 49.4 kg   SpO2 96%   BMI 18.14 kg/m  Pain Scale: Faces       SpO2: SpO2: 96 % O2 Device:SpO2: 96 % O2 Flow Rate: .O2 Flow Rate (L/min): 2 L/min  IO: Intake/output summary:    Intake/Output Summary (Last 24 hours) at 08/04/2018 1441 Last data filed at 08/04/2018 0150 Gross per 24 hour  Intake 797.14 ml  Output -  Net 797.14 ml    LBM: Last BM Date: (unknown) Baseline Weight: Weight: 49 kg Most recent weight: Weight: 49.4 kg     Palliative Assessment/Data:  10%     Time In: 1:50 Time Out: 3:00 Time Total: 70 min Greater than 50%  of this time was spent counseling and coordinating care related to the above assessment and plan.  Signed by: Asencion Gowda, NP   Please contact Palliative Medicine Team phone at [phone] for questions and concerns.  For individual provider: See Shea Evans

## 2018-08-04 NOTE — Progress Notes (Signed)
MD notified of BP

## 2018-08-04 NOTE — Progress Notes (Signed)
New referral for [facility] home received from [facility] following a Palliative medicine consult. [name] a 85 year old woman with a  History of dementia, admitted to [facility] on 7/[facility] from [facility], found to have a sodium of 173 in the ED. Palliative Medicine was consulted and have met with patient's son Nicki Reaper and daughter Dorian Pod, they have chosen to focus on comfort with transfer to the hospice home when a bed is available. Writer met in the room with Nicki Reaper and Dorian Pod, patient seen lying in bed, moaning, appeared to be very uncomfortable. Palliative NP Crystal and staff nurse Berenice aware and orders are being placed for comfort medications, reassurance given to family. Writer initiated education regarding hospice home services, philosophy, visiting regulations and team approach to care with understanding voiced.  Scott to meet with Probation officer at 9:30 am in the patient's room to sign consents. Patient information faxed to referral. Hospital care team updated. Flo Shanks BSN, RN [facility] Liaison [facility] [phone]

## 2018-08-04 NOTE — TOC Progression Note (Signed)
Transition of Care [facility]) - Progression Note    Patient Details  Name: Rita Vang MRN: [id number] Date of Birth: 1924/12/28  Transition [facility] CM/SW Contact  Katrina Stack, RN Phone Number: 08/04/2018, 7:28 PM  Clinical Narrative:    Patient to transfer to [facility] when bed is available.  Notified [facility] and [facility] of plan        Expected Discharge Plan and Services                                                 Social Determinants of Health (SDOH) Interventions    Readmission Risk Interventions Readmission Risk Prevention Plan 07/06/2018  Transportation Screening Complete  PCP or Specialist Appt within 3-5 Days Complete  HRI or [place] Complete  Social Work Consult for [place] Planning/Counseling Not Complete  SW consult not completed comments NA  Palliative Care Screening Not Applicable  Medication Review Press photographer) Complete  Some recent data might be hidden

## 2018-08-04 NOTE — Progress Notes (Signed)
Patient not alert and oriented this morning. Will only respond to voice. Not safe for patient to take anything PO right now. IV fluids going. NP rounding on patient this morning and called son Rita Vang, healthcare power of attorney. Plan is to let him come see patient. NP ok with son and daughter coming to see patient. Will arrange that and continue to monitor patient.

## 2018-08-04 NOTE — Progress Notes (Signed)
I have seen the patient, she has decreased oral intake, possible aspiration and severe dehydration with hypernatremia.  She is lethargic. Appears in very poor state and overall prognosis is very poor I have discussed the case with nurse practitioner and also had a long discussion with patient's son and family in the room along with palliative care nurse.  Family had agreed on transitioning her to comfort care and agreed on discharge to hospice home tomorrow.

## 2018-08-04 NOTE — TOC Initial Note (Addendum)
Transition of Care [facility](TOC) - Initial/Assessment Note    Patient Details  Name: Rita SnareFrances L Pandolfi MRN: [id number] Date of Birth: 10/03/1924  Transition of Care [facility](TOC) CM/SW Contact:    Eber HongGreene, Shaaron Golliday R, RN Phone Number: 08/04/2018, 8:38 AM  Clinical Narrative:                Patient sent to the ED from [facility] due to lab work indicating dehydration and increased WBC.  [facility]  Son [name] is her HCPOA.  [name] currently a limited code. Scott does not want electro shock or chest compressions at present.  Will make decision regarding portable DNR at discharge. Patient was a resident 6 weeks prior to the Covid outbreak.  Patient has during this time developed bed sores due to progressive immobility as physical therapy was not allowed in the building during the pandemic. [name] followed by hospice he says due to her frequent admission - not necessarily because she is requiring end of life care. At present, he not sure of his desired disposition for his mother. Options- return to [facility] under hospice; return to [facility] with home health services- if facility is allowing home health staff enter the building; Skilled nursing short term placement if it is felt patient would benefit and if can follow instructions. Patient does have dementia  She is going to require wound care for her pressure areas. Patient has continuous oxygen "probably through hospice" at her facility. Scott wishes to received clearance to visit his mother.  Unit director/assistance director will call him to discuss the criteria. Completed an FL2. Patient has current pasrr. Did not send out as do not have permission from patient's son. Requested order for physical therapy. Covid negative 7/20.         Patient Goals and CMS Choice        Expected Discharge Plan and Services                                                Prior Living Arrangements/Services   Lives with::  Facility Resident([facility]) Patient language and need for interpreter reviewed:: Yes        Need for Family Participation in Patient Care: (NA) Care giver support system in place?: Yes (comment) Current home services: Hospice([facility]) Criminal Activity/Legal Involvement Pertinent to Current Situation/Hospitalization: No - Comment as needed  Activities of Daily Living      Permission Sought/Granted                  Emotional Assessment Appearance:: Appears stated age Attitude/Demeanor/Rapport: Lethargic Affect (typically observed): Calm   Alcohol / Substance Use: Not Applicable Psych Involvement: No (comment)  Admission diagnosis:  Weakness [R53.1] Hyponatremia [E87.1] Patient Active Problem List   Diagnosis Date Noted  . Hypernatremia 08/04/2018  . Hyponatremia 08/04/2018  . Pneumonia 07/01/2018  . Unstageable pressure ulcer of sacral region (HCC) 01/29/2018  . TIA (transient ischemic attack) 06/17/2017  . Palliative care by specialist   . DNR (do not resuscitate) discussion   . Lewy body dementia without behavioral disturbance (HCC)   . Upper GI bleed 05/01/2017  . Acute gastrointestinal hemorrhage   . Gastric irritation   . Herpes genitalis 04/04/2017  . Protein-calorie malnutrition (HCC) 04/04/2017  . Hypomagnesemia 04/04/2017  . Vitamin B12 deficiency 04/04/2017  . Generalized weakness 04/01/2017  .  Decubitus ulcers 03/07/2017  . Sepsis ([place]) 03/06/2017  . CAP (community acquired pneumonia) 03/06/2017  . HTN (hypertension) 03/06/2017  . Anxiety 03/06/2017  . HLD (hyperlipidemia) 03/06/2017  . GERD (gastroesophageal reflux disease) 03/06/2017  . CKD (chronic kidney disease), stage III ([place]) 03/06/2017  . CVA (cerebral infarction) 08/23/2014   PCP:  Baxter Hire, MD Pharmacy:   [place], [place] - [address] [place] [address] Phone: [phone] Fax: [place] [address], [place] - [place] [facility] [place] [address] Phone: [phone] Fax: [phone]  [facility] [place], [place] [address] [place] [address] Phone: [phone] Fax: [phone]     Social Determinants of Health (SDOH) Interventions    Readmission Risk Interventions Readmission Risk Prevention Plan 07/06/2018  Transportation Screening Complete  PCP or Specialist Appt within 3-5 Days Complete  HRI or [place] Complete  Social Work Consult for [place] Planning/Counseling Not Complete  SW consult not completed comments NA  Palliative Care Screening Not Applicable  Medication Review Press photographer) Complete  Some recent data might be hidden

## 2018-08-04 NOTE — Progress Notes (Addendum)
Patient's son Nicki Reaper was at patient's bedside, has now left. Daughter Dorian Pod still at bedside. Patient on comfort care now. IV team at bedside to put in new IV because the one she had was draining. Family ok with that.

## 2018-08-04 NOTE — ED Notes (Signed)
Called report and floor nurse felt that due to pts heart rate and low pressures that 1C was not the appropriate floor for this patient. ED charge nurse notified. Admitting doctor paged.

## 2018-08-08 LAB — CULTURE, BLOOD (ROUTINE X 2)
Culture: NO GROWTH
Culture: NO GROWTH
Special Requests: ADEQUATE
Special Requests: ADEQUATE

## 2018-08-15 NOTE — Discharge Summary (Signed)
Date of death- 07/22/2018  Cause of death- Hypernatremia, dehydration, ac renal failure       Aspiration pneumonia      All of them Due to [facility] course  Rita Vang  is a 85 y.o. Caucasian female with a known history of multiple medical problems that are mentioned below, who presented to the emergency room with acute onset of failure to thrive with decreased responsiveness.  She has been having decreased p.o. intake and there was a concern about dehydration.  The patient was alert to her name but otherwise fairly somnolent and nonverbal and therefore no history could be obtained from her.  Her son who is her power of attorney was contacted earlier and stated that the patient is DNI but can be resuscitated.  Few weeks ago the patient was verbal and stated that she wanted to keep going.  She is on hospice though but for palliative care.  She had leukocytosis apparently a couple of days ago.  When she came to the ER, blood pressure was 94/59 and later 88/49 with respiratory to 23 and otherwise normal vital signs.  Blood pressure with hydration came up to 108/70 and her latest was 105/66 with a heart rate of 118 and respiratory to 23.  Labs are remarkable for severe hyponatremia with a sodium of 174 and hypochloremia of 130 with a CO2 38, BUN of 73 and creatinine of 1.59 compared to 36 and 1.2 on 07/03/2018 which is about a month ago.  GFR is 28 down from 39 then.  CBC showed leukocytosis of 24.4 compared to 16.1 then with anemia at her baseline.  Urinalysis was unremarkable and COVID-19 test came back negative.  Portable chest x-ray showed likely fluid trapped within the major fissure on the right with vascular congestion and some patchy infiltrates that are similar to those seen on prior exam and actually looking better on the right lung base per my review.  Patient was not reported to have any worsening dyspnea though.  She was given 500 mill of IV normal saline in the ER followed by 100  mL/h, IV cefepime, Flagyl and vancomycin.  She will be admitted to the medical monitored bed for further evaluation and management.  I have seen the patient, she has decreased oral intake, possible aspiration and severe dehydration with hypernatremia.  She is lethargic. Appears in very poor state and overall prognosis is very poor I have discussed the case with nurse practitioner and also had a long discussion with patient's son and family in the room along with palliative care nurse.  Family had agreed on transitioning her to comfort care and agreed on discharge to hospice home   Pt passed away in comfort care in hospital.

## 2018-08-15 NOTE — Progress Notes (Signed)
Pt's heart and breathing stopped at 0731.  Verified by Linard Millers, RN.  Dr. [place] notified.  Sherryll Burger, son, called and asked to return to bedside.  He returned at [place], and was informed of his mother's passing.  Per Nicki Reaper, his sister is in route to the hospital.

## 2018-08-15 NOTE — Progress Notes (Signed)
Arrived to patient's room, family not present. Referral and hospice home notified of patient's death. Flo Shanks BSN, RN, [facility] [phone]

## 2018-08-15 DEATH — deceased

## 2019-09-01 IMAGING — DX DG CHEST PORT 1 VIEW
1 series · 1 of 1 positions shown · non-contrast
Comparison: March 06, 2017

CLINICAL DATA: Cough and shortness of breath.

EXAM:
PORTABLE CHEST 1 VIEW

[chest ap]
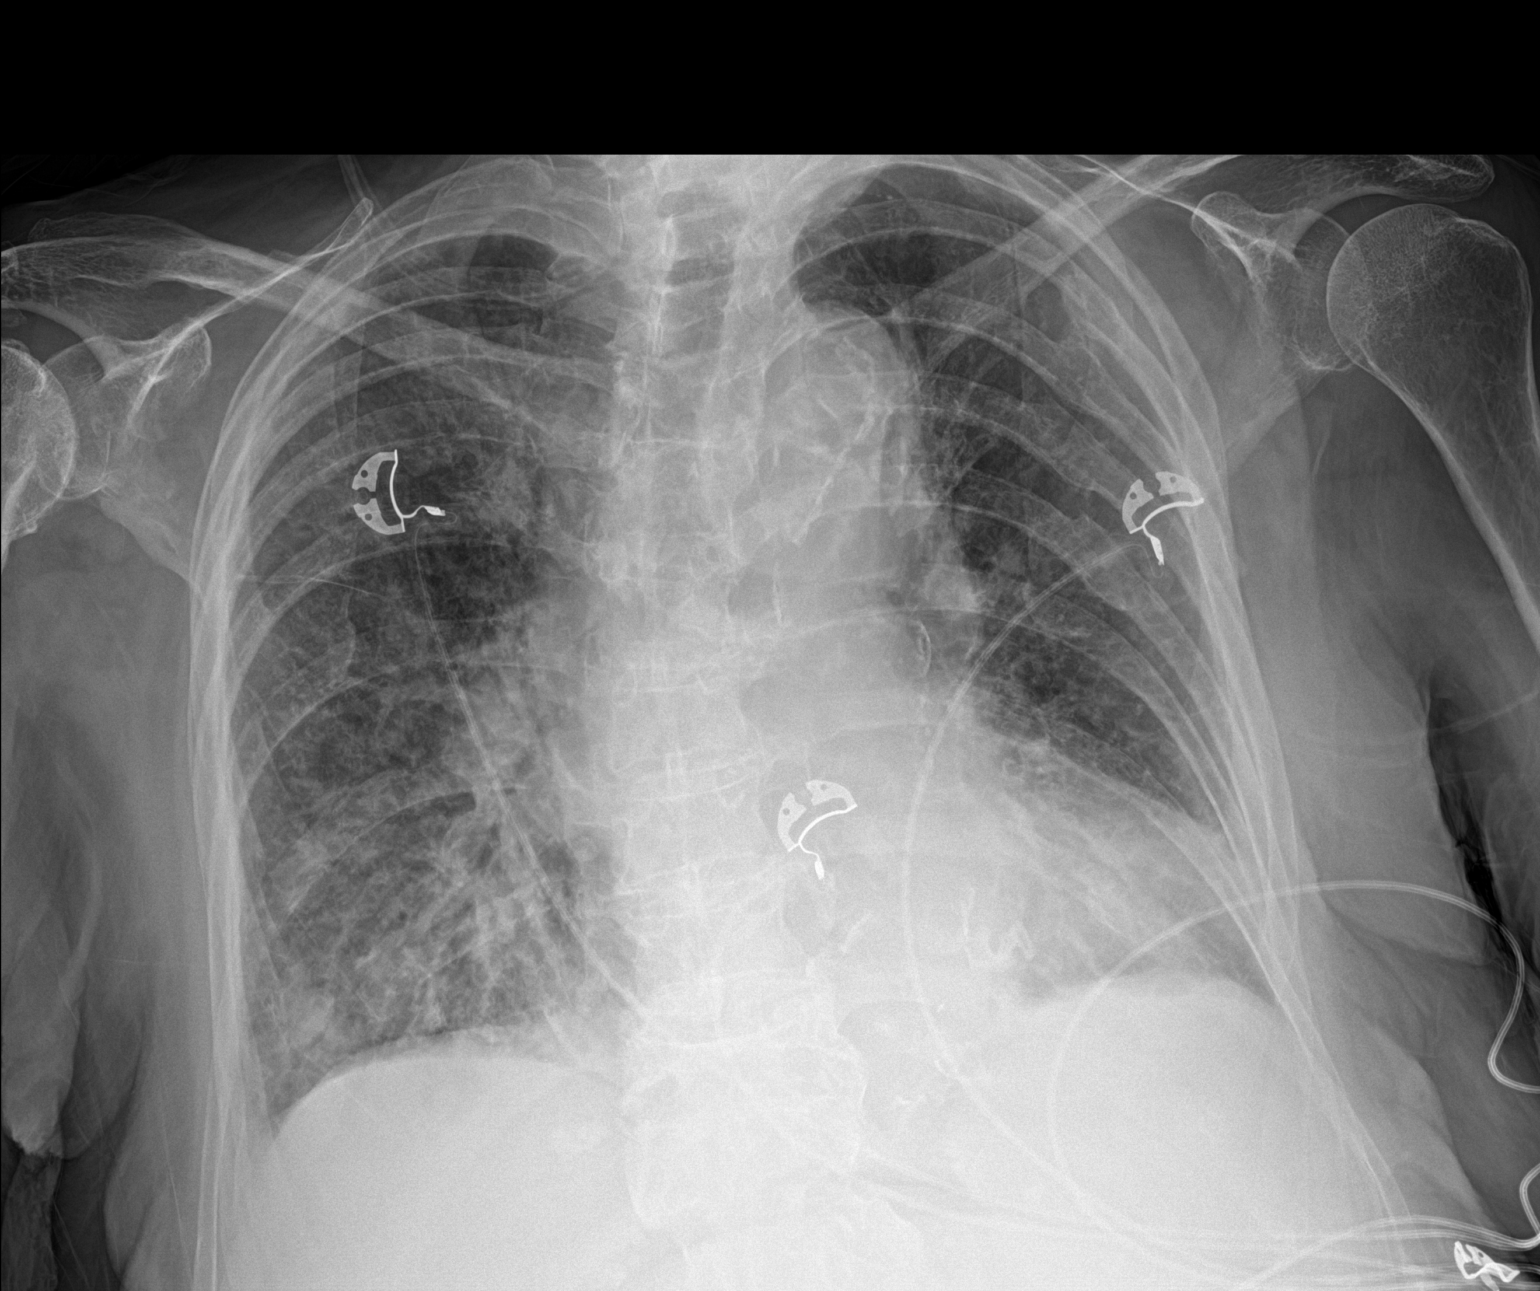

[1 of 1 positions shown; findings below may reference images not displayed]

FINDINGS: The heart, hila, and mediastinum are unchanged. Mild pulmonary
venous congestion. Patchy infiltrate developing in the right base.
Healed rib fractures on the left. No other acute abnormalities.
IMPRESSION: 1. Patchy infiltrate developing in the right base. Mild pulmonary
venous congestion. Recommend follow-up to resolution.

## 2020-07-18 IMAGING — DX DG CHEST 1V PORT
1 series · 1 of 1 positions shown · non-contrast
Comparison: Two-view chest x-ray 05/04/2017

CLINICAL DATA: Fever and cough.

EXAM:
PORTABLE CHEST 1 VIEW

[chest ap]
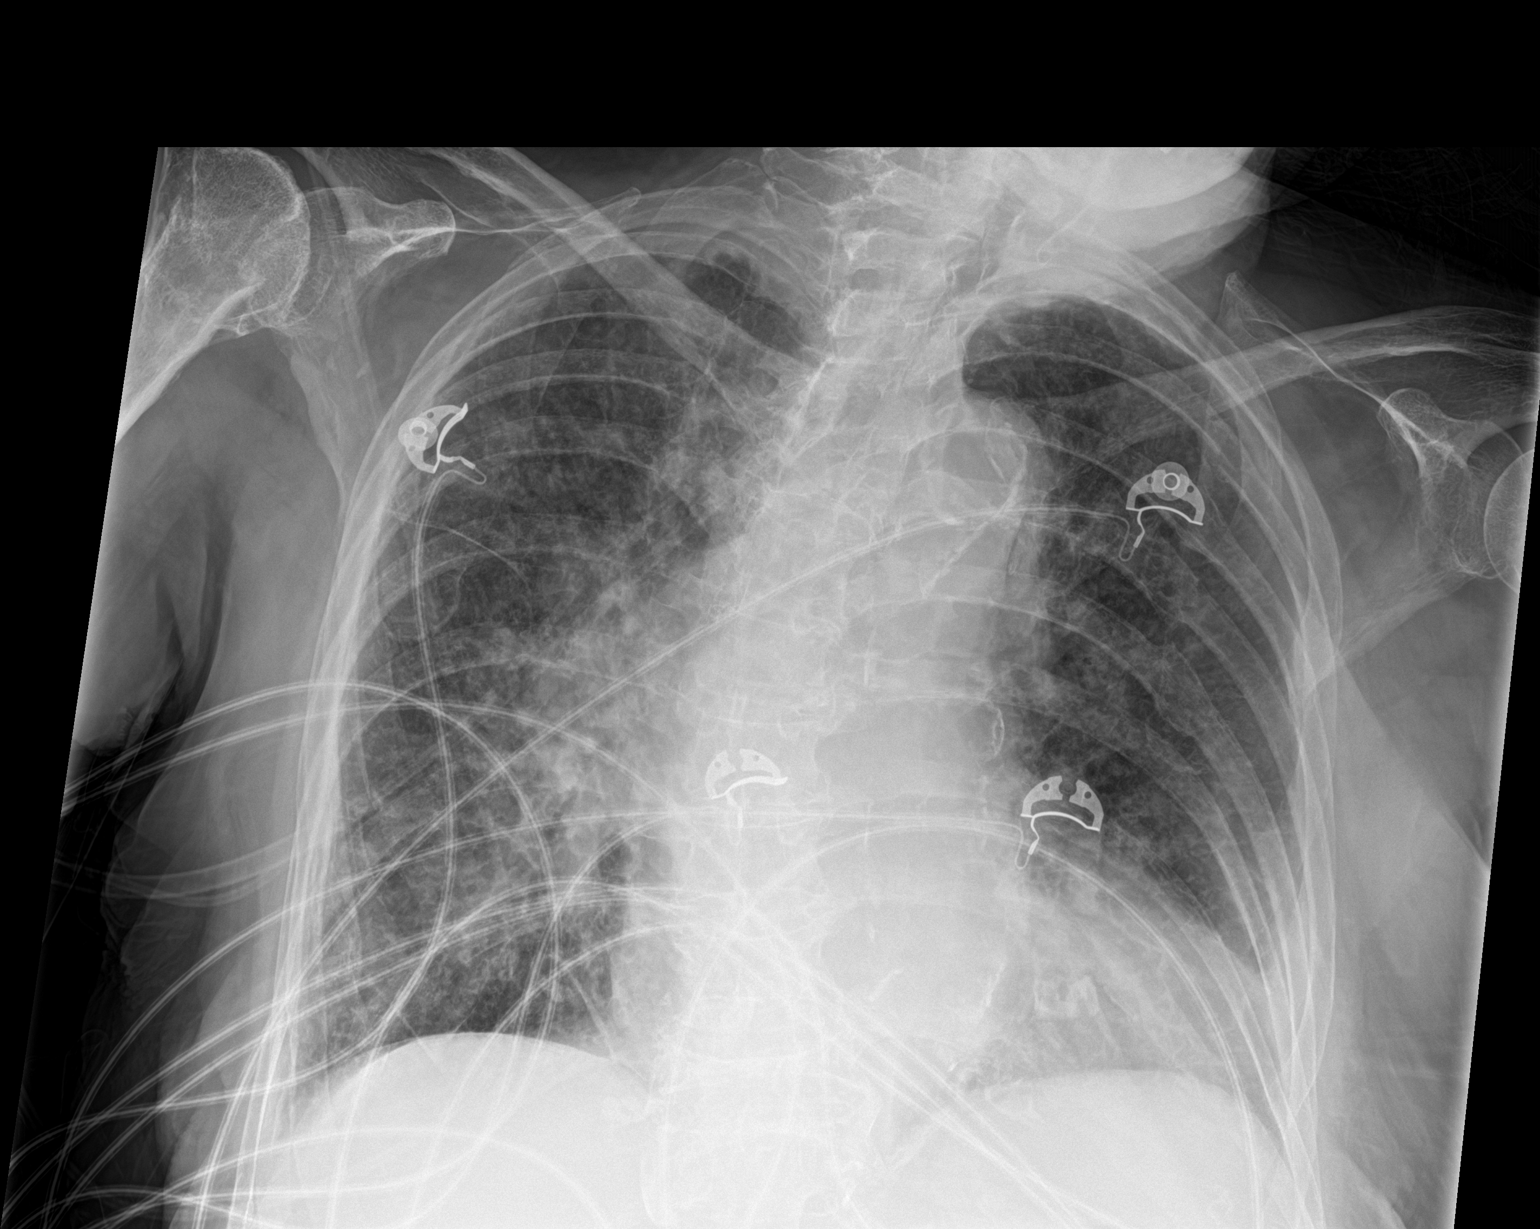

[1 of 1 positions shown; findings below may reference images not displayed]

FINDINGS: The heart is enlarged. Aortic atherosclerosis is present. Asymmetric
right perihilar airspace opacity is noted. Mild pulmonary vascular
congestion is present. No other significant airspace disease is
present. Remote posterior left rib fractures and multiple spine
fractures are again noted. Remote right humerus fracture is noted.
IMPRESSION: 1. Cardiomegaly and pulmonary vascular congestion likely reflects
some element of congestive heart failure.
2. Asymmetric right perihilar airspace opacity. The patient has
chronic asymmetric right perihilar prominence. This has increased.
While this may represent pulmonary vascular congestion, infection is
not excluded. Recommend follow-up chest x-ray to assure improvement.

## 2020-12-23 IMAGING — DX PORTABLE CHEST - 1 VIEW
1 series · 1 of 1 positions shown · non-contrast
Comparison: 01/27/2018

CLINICAL DATA: Worsening shortness of breath.

EXAM:
PORTABLE CHEST 1 VIEW

[chest ap]
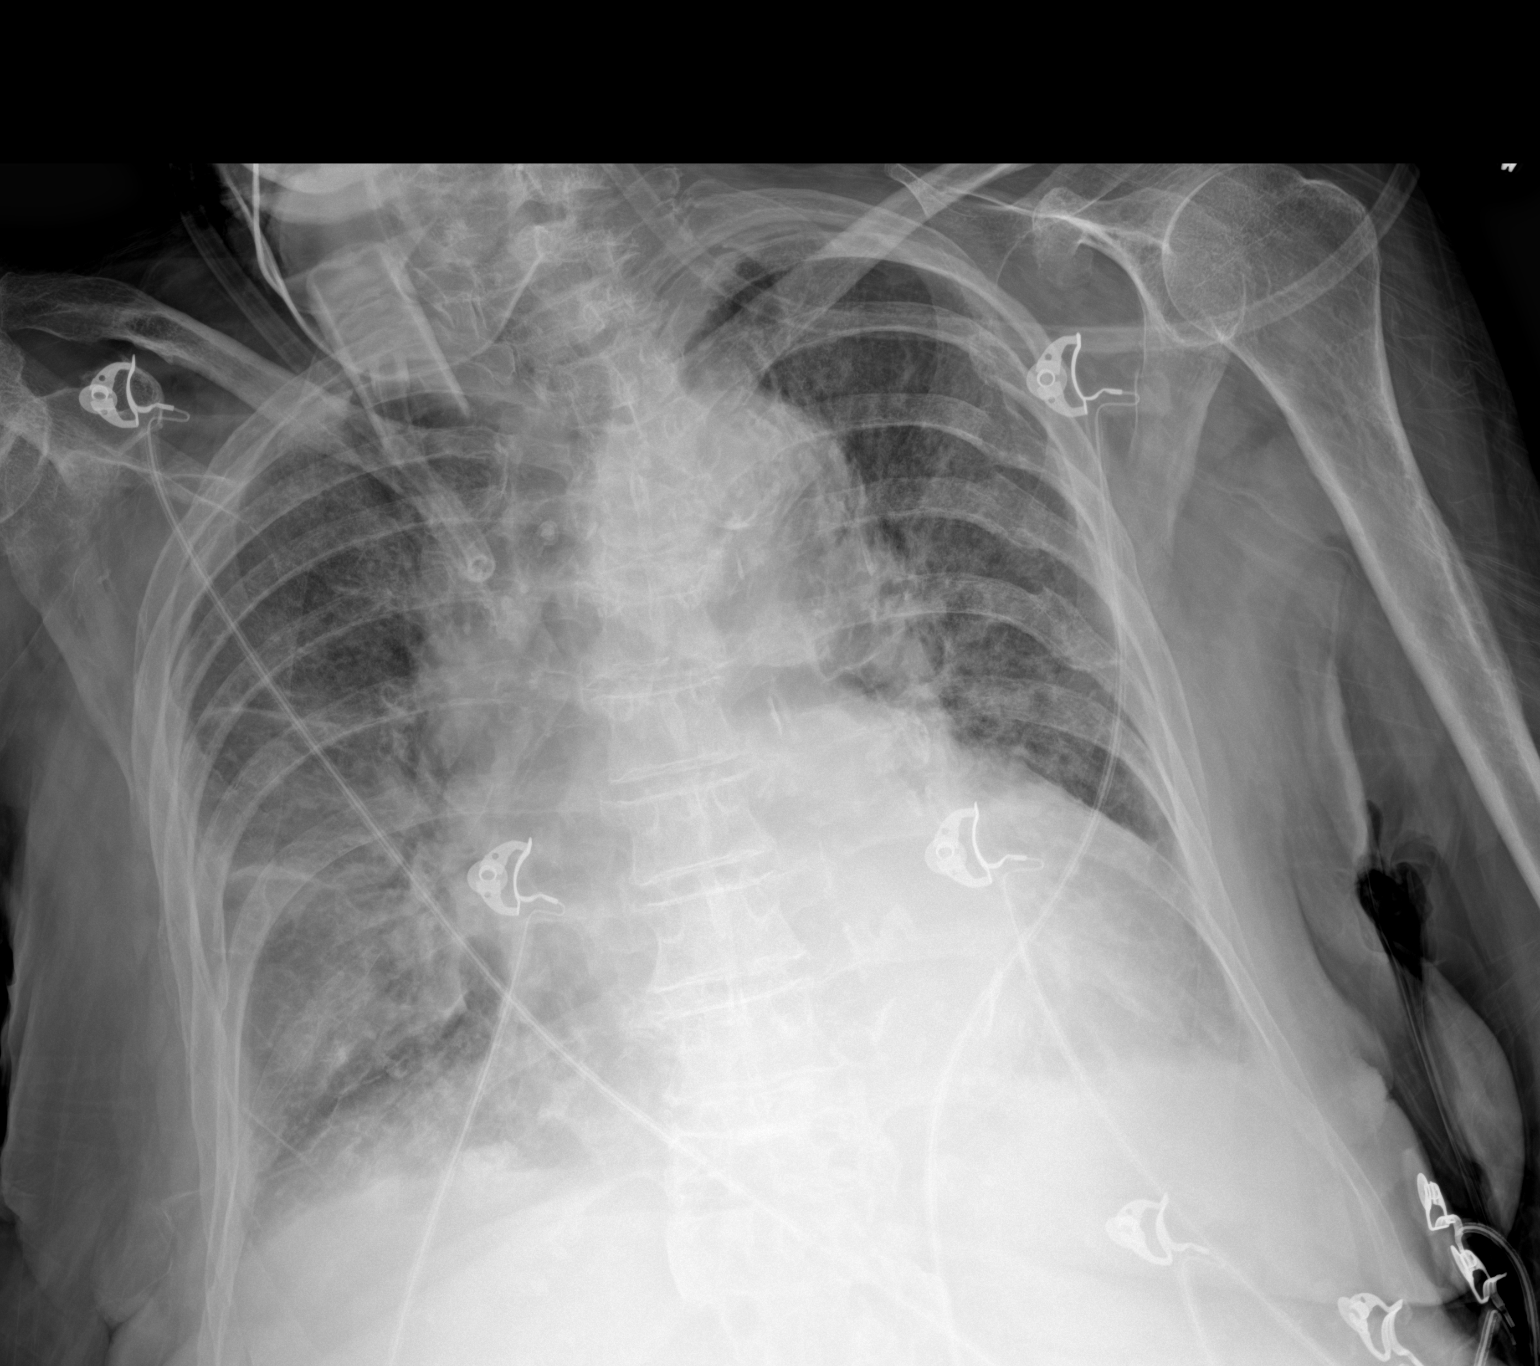

[1 of 1 positions shown; findings below may reference images not displayed]

FINDINGS: The heart is enlarged but stable. There is marked tortuosity and
calcification of the thoracic aorta. Patchy bilateral lung
infiltrates, right greater than left with probable small effusions.
Chronic underlying lung changes.

The bony thorax is intact. Remote healed bilateral rib fractures are
noted.
IMPRESSION: Stable cardiac enlargement and central vascular congestion along
with underlying lung disease.

Patchy bilateral infiltrates, right greater than left and probable
small effusions.
